# Patient Record
Sex: Female | Born: 1970 | Race: White | Hispanic: Yes | Marital: Single | Smoking: Never smoker
Health system: Southern US, Community
[De-identification: ages and names within clinical notes are randomized; demographics above are authoritative.]

## PROBLEM LIST (undated history)

## (undated) DIAGNOSIS — R112 Nausea with vomiting, unspecified: Secondary | ICD-10-CM

## (undated) DIAGNOSIS — D649 Anemia, unspecified: Secondary | ICD-10-CM

## (undated) DIAGNOSIS — E119 Type 2 diabetes mellitus without complications: Secondary | ICD-10-CM

## (undated) DIAGNOSIS — E785 Hyperlipidemia, unspecified: Secondary | ICD-10-CM

## (undated) DIAGNOSIS — Z9889 Other specified postprocedural states: Secondary | ICD-10-CM

## (undated) DIAGNOSIS — K219 Gastro-esophageal reflux disease without esophagitis: Secondary | ICD-10-CM

## (undated) DIAGNOSIS — I10 Essential (primary) hypertension: Secondary | ICD-10-CM

## (undated) HISTORY — DX: Gastro-esophageal reflux disease without esophagitis: K21.9

## (undated) HISTORY — DX: Hyperlipidemia, unspecified: E78.5

## (undated) HISTORY — PX: LAPAROSCOPIC UNILATERAL SALPINGO OOPHERECTOMY: SHX5935

## (undated) HISTORY — DX: Other specified postprocedural states: Z98.890

## (undated) HISTORY — DX: Type 2 diabetes mellitus without complications: E11.9

## (undated) HISTORY — DX: Anemia, unspecified: D64.9

## (undated) HISTORY — DX: Essential (primary) hypertension: I10

## (undated) HISTORY — DX: Other specified postprocedural states: R11.2

---
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
## 1991-06-02 HISTORY — PX: LAPAROSCOPIC UNILATERAL SALPINGO OOPHERECTOMY: SHX5935

## 2006-07-07 ENCOUNTER — Ambulatory Visit: Payer: Self-pay | Admitting: *Deleted

## 2006-07-07 ENCOUNTER — Encounter (INDEPENDENT_AMBULATORY_CARE_PROVIDER_SITE_OTHER): Payer: Self-pay | Admitting: *Deleted

## 2006-07-08 ENCOUNTER — Ambulatory Visit (HOSPITAL_COMMUNITY): Admission: RE | Admit: 2006-07-08 | Discharge: 2006-07-08 | Payer: Self-pay | Admitting: Family Medicine

## 2006-07-09 ENCOUNTER — Ambulatory Visit: Payer: Self-pay | Admitting: Gynecology

## 2006-07-13 ENCOUNTER — Ambulatory Visit (HOSPITAL_COMMUNITY): Admission: RE | Admit: 2006-07-13 | Discharge: 2006-07-13 | Payer: Self-pay | Admitting: Family Medicine

## 2006-07-22 ENCOUNTER — Ambulatory Visit: Payer: Self-pay | Admitting: Family Medicine

## 2006-07-26 ENCOUNTER — Ambulatory Visit: Payer: Self-pay | Admitting: Family Medicine

## 2006-08-02 ENCOUNTER — Ambulatory Visit: Payer: Self-pay | Admitting: Family Medicine

## 2006-08-03 ENCOUNTER — Ambulatory Visit (HOSPITAL_COMMUNITY): Admission: RE | Admit: 2006-08-03 | Discharge: 2006-08-03 | Payer: Self-pay | Admitting: Family Medicine

## 2006-08-09 ENCOUNTER — Ambulatory Visit: Payer: Self-pay | Admitting: Obstetrics & Gynecology

## 2006-08-23 ENCOUNTER — Ambulatory Visit: Payer: Self-pay | Admitting: Obstetrics & Gynecology

## 2006-08-31 ENCOUNTER — Ambulatory Visit (HOSPITAL_COMMUNITY): Admission: RE | Admit: 2006-08-31 | Discharge: 2006-08-31 | Payer: Self-pay | Admitting: Family Medicine

## 2006-09-06 ENCOUNTER — Ambulatory Visit: Payer: Self-pay | Admitting: Obstetrics & Gynecology

## 2006-09-13 ENCOUNTER — Ambulatory Visit: Payer: Self-pay | Admitting: Obstetrics & Gynecology

## 2006-09-30 ENCOUNTER — Ambulatory Visit (HOSPITAL_COMMUNITY): Admission: RE | Admit: 2006-09-30 | Discharge: 2006-09-30 | Payer: Self-pay | Admitting: Family Medicine

## 2006-10-04 ENCOUNTER — Ambulatory Visit: Payer: Self-pay | Admitting: Obstetrics & Gynecology

## 2006-10-11 ENCOUNTER — Encounter: Admission: RE | Admit: 2006-10-11 | Discharge: 2006-10-11 | Payer: Self-pay | Admitting: Obstetrics & Gynecology

## 2006-10-18 ENCOUNTER — Ambulatory Visit: Payer: Self-pay | Admitting: Family Medicine

## 2006-10-22 ENCOUNTER — Ambulatory Visit (HOSPITAL_COMMUNITY): Admission: RE | Admit: 2006-10-22 | Discharge: 2006-10-22 | Payer: Self-pay | Admitting: Family Medicine

## 2006-10-26 ENCOUNTER — Ambulatory Visit: Payer: Self-pay | Admitting: Obstetrics & Gynecology

## 2006-11-01 ENCOUNTER — Ambulatory Visit: Payer: Self-pay | Admitting: Obstetrics & Gynecology

## 2006-11-04 ENCOUNTER — Ambulatory Visit: Payer: Self-pay | Admitting: Family Medicine

## 2006-11-08 ENCOUNTER — Ambulatory Visit: Payer: Self-pay | Admitting: Family Medicine

## 2006-11-11 ENCOUNTER — Ambulatory Visit: Payer: Self-pay | Admitting: Obstetrics & Gynecology

## 2006-11-15 ENCOUNTER — Ambulatory Visit: Payer: Self-pay | Admitting: Family Medicine

## 2006-11-18 ENCOUNTER — Ambulatory Visit: Payer: Self-pay | Admitting: Gynecology

## 2006-11-19 ENCOUNTER — Encounter: Payer: Self-pay | Admitting: *Deleted

## 2006-11-19 ENCOUNTER — Inpatient Hospital Stay (HOSPITAL_COMMUNITY): Admission: AD | Admit: 2006-11-19 | Discharge: 2006-11-19 | Payer: Self-pay | Admitting: Gynecology

## 2006-11-19 ENCOUNTER — Ambulatory Visit: Payer: Self-pay | Admitting: Physician Assistant

## 2006-11-22 ENCOUNTER — Ambulatory Visit: Payer: Self-pay | Admitting: Family Medicine

## 2006-11-25 ENCOUNTER — Ambulatory Visit (HOSPITAL_COMMUNITY): Admission: RE | Admit: 2006-11-25 | Discharge: 2006-11-25 | Payer: Self-pay | Admitting: Family Medicine

## 2006-11-29 ENCOUNTER — Ambulatory Visit: Payer: Self-pay | Admitting: Obstetrics & Gynecology

## 2006-12-01 ENCOUNTER — Inpatient Hospital Stay (HOSPITAL_COMMUNITY): Admission: RE | Admit: 2006-12-01 | Discharge: 2006-12-04 | Payer: Self-pay | Admitting: Obstetrics & Gynecology

## 2006-12-01 ENCOUNTER — Ambulatory Visit: Payer: Self-pay | Admitting: Obstetrics & Gynecology

## 2006-12-09 ENCOUNTER — Ambulatory Visit: Payer: Self-pay | Admitting: Obstetrics & Gynecology

## 2006-12-23 ENCOUNTER — Ambulatory Visit: Payer: Self-pay | Admitting: Obstetrics & Gynecology

## 2007-09-19 ENCOUNTER — Emergency Department (HOSPITAL_COMMUNITY): Admission: EM | Admit: 2007-09-19 | Discharge: 2007-09-19 | Payer: Self-pay | Admitting: Emergency Medicine

## 2007-12-13 ENCOUNTER — Inpatient Hospital Stay (HOSPITAL_COMMUNITY): Admission: AD | Admit: 2007-12-13 | Discharge: 2007-12-13 | Payer: Self-pay | Admitting: Gynecology

## 2007-12-28 ENCOUNTER — Ambulatory Visit: Payer: Self-pay | Admitting: Obstetrics and Gynecology

## 2009-07-15 ENCOUNTER — Emergency Department (HOSPITAL_COMMUNITY): Admission: EM | Admit: 2009-07-15 | Discharge: 2009-07-15 | Payer: Self-pay | Admitting: Emergency Medicine

## 2010-06-22 ENCOUNTER — Encounter: Payer: Self-pay | Admitting: *Deleted

## 2010-08-04 ENCOUNTER — Encounter (INDEPENDENT_AMBULATORY_CARE_PROVIDER_SITE_OTHER): Payer: Self-pay

## 2010-08-04 ENCOUNTER — Encounter (INDEPENDENT_AMBULATORY_CARE_PROVIDER_SITE_OTHER): Payer: Self-pay | Admitting: Vascular Surgery

## 2010-08-04 DIAGNOSIS — I83009 Varicose veins of unspecified lower extremity with ulcer of unspecified site: Secondary | ICD-10-CM

## 2010-08-04 DIAGNOSIS — I83893 Varicose veins of bilateral lower extremities with other complications: Secondary | ICD-10-CM

## 2010-08-05 ENCOUNTER — Ambulatory Visit (INDEPENDENT_AMBULATORY_CARE_PROVIDER_SITE_OTHER): Payer: Self-pay

## 2010-08-05 DIAGNOSIS — I731 Thromboangiitis obliterans [Buerger's disease]: Secondary | ICD-10-CM

## 2010-08-05 NOTE — Consult Note (Signed)
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  This patient has never had venous evaluation in the past.  She has no history of DVT or thrombophlebitis.  She has had 2 episodes of bleeding in the left ankle laterally, one in February of 2011 and one in January of 2012.  The first one required a trip to the emergency department but no sutures were necessary, the second one stopped spontaneously.  She has no history of stasis ulcers.  She does not wear elastic compression stockings nor elevate her legs nor take pain medicine.  She does have itching discomfort in both legs, left greater than right.  CHRONIC MEDICAL PROBLEMS: 1. Diabetes. 2. Hypertension. 3. History of cesarean surgery. 4. Negative for coronary artery disease, COPD or stroke.  SOCIAL HISTORY:  She is single, has 2 children.  She is employed part- time with [employer].  Does not use tobacco or alcohol.  FAMILY HISTORY:  Negative for coronary artery disease, diabetes or stroke.  REVIEW OF SYSTEMS:  Positive for dizziness, headaches, leg discomfort with ambulation, joint pain, muscle pains.  All other systems in a complete review of systems are negative.  PHYSICAL EXAM:  Vital signs:  Blood pressure 134/86, heart rate 65, respirations 20.  General:  She is a well-developed, well-nourished middle-aged female in no apparent distress, alert and oriented x3. HEENT:  Normal for age.  EOMs intact.  Lungs:  Clear to auscultation. No rhonchi or wheezing.  Cardiovascular:  Reveals a regular rhythm.  No murmurs.  Carotid pulses 3+.  No bruits.  Abdomen:  Soft, nontender with no masses.  Musculoskeletal:  Free of major deformities.  Neurological: Normal.  Skin:  Free of rashes.  Lower extremity exam:  Reveals 3+ femoral, 2+  popliteal and 2+ dorsalis pedis pulses palpable.  She has an area of reticular veins and small varicosities in the lateral ankle area proximal to the lateral malleolus with some small varicosities which extend posteriorly up the popliteal area.  There are no bulging varicosities along the great saphenous system.  There is no distal edema.  No evidence of hyperpigmentation or active ulceration.  I ordered a venous duplex exam which I reviewed and interpreted.  She has no reflux in the great or small saphenous veins.  No DVTs noted. 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  I think the best plan for this lady would be primary sclerotherapy of the area where the bleeding occurred in these varicosities in the posterior calf.  She currently does not have any health insurance and will investigate applying for Medicaid in the near future.  We have talked to her about arranging for sclerotherapy of this lesion in the left lateral ankle which is the most pressing area which did bleed 1 month ago.  She will get in touch with [place] to schedule this.    Quita Skye Hart [place], M.D. Electronically Signed  JDL/MEDQ  D:  08/04/2010  T:  08/05/2010  Job:  [id number]

## 2010-08-13 NOTE — Procedures (Unsigned)
LOWER EXTREMITY VENOUS REFLUX EXAM  INDICATION:  Ulcer.  EXAM:  Using color-flow imaging and pulse Doppler spectral analysis, the left common femoral, superficial femoral, popliteal, posterior tibial, greater and lesser saphenous veins are evaluated.  There is no evidence suggesting deep venous insufficiency in the left lower extremity.  The left saphenofemoral junction is competent. The left GSV is competent.  The left proximal short saphenous vein demonstrates competency.  GSV Diameter (used if found to be incompetent only)                                           Right    Left Proximal Greater Saphenous Vein           cm       cm Proximal-to-mid-thigh                     cm       cm Mid thigh                                 cm       cm Mid-distal thigh                          cm       cm Distal thigh                              cm       cm Knee                                      cm       cm  IMPRESSION: 1. The left greater saphenous vein is competent. 2. The left greater saphenous vein is not tortuous. 3. The deep venous system is competent. 4. The left short saphenous vein is competent. 5. An incompetent perforator along with varicose veins was visualized     within the  cockett regions 1, 2, and 3, measuring 0.24, 0.23, and     0.21 cm respectively.  ___________________________________________ Quita Skye Hart [place], M.D.  OD/MEDQ  D:  08/06/2010  T:  08/06/2010  Job:  [id number]

## 2010-10-14 NOTE — Op Note (Signed)
NAMEJULIAHNA, WISWELL   ACCOUNT NO.:  [id number]   MEDICAL RECORD NO.:  [id number]          PATIENT TYPE:  INP   LOCATION:                                [facility]:  [facility]   PHYSICIAN:  Lesly Dukes, M.D. DATE OF BIRTH:  10/16/1970   DATE OF PROCEDURE:  12/01/2006  DATE OF DISCHARGE:                               OPERATIVE REPORT   PREOPERATIVE DIAGNOSIS:  A 40 year old G2, P1 0-0-0 at 38-6 weeks.  Estimated gestational age for repeat cesarean section.   POSTOPERATIVE DIAGNOSIS:  A 40 year old G2, P1 0-0-0 at 38-6 weeks.  Estimated gestational age for repeat cesarean section plus transverse  lie.   PROCEDURE:  Repeat low transverse cesarean section.   SURGEON:  Lesly Dukes, M.D.   ASSISTANTMichele Mcalpine D. Okey Dupre, M.D.   ANESTHESIA:  Spinal.   PATHOLOGY:  None.   ESTIMATED BLOOD LOSS:  800.   COMPLICATIONS:  None.   FINDINGS:  Viable infant at first transverse back-down presentation that  was rotated to vertex.  Grossly normal uterus, ovaries, and fallopian  tubes.  A moderate amount of adhesion.  See NICU note for details on the  infant.   PROCEDURE:  After informed consent was obtained, the patient was taken  to the operating room, where spinal anesthesia was found to be adequate.  The patient was placed in the dorsal supine position with a leftward  tilt.  The Foley was in the bladder.  A Pfannenstiel skin incision was  made with the scalpel and carried down to the fascia.  The fascia was  incised in the midline and extended bilaterally.  The superior and  inferior aspects of the fascial incision were grasped with kocher  clamps, tented up, and dissected sharply and bluntly from the underlying  layers of rectus muscles.  The rectus muscles were separated in the  midline.  The peritoneum was identified and entered sharply.  This  incision was extended both superiorly and inferiorly with good  visualization of the bladder.  Note, there was a moderate amount  of  adhesions during this process.  The bladder blade was placed into the  peritoneal cavity.  The bladder flap was created.  The bladder blade was  reinserted.  The uterus was incised in a transverse fashion to the lower  uterine segment.  This incision was extended bilaterally with bandage  scissors.  The baby was rotated from transverse back down to vertex, and  the head was delivered atraumatically.  The nose and mouth were  suctioned.  The cord was clamped and cut, and the baby was handed off to  the waiting pediatrician.  Cord blood was sent for type and screen.  Placenta delivered manually.  The uterus was cleared of all clots and  debris.  The uterine incision was closed with 0 Vicryl in a running,  locked fashion.  A second suture of 0 Vicryl was used to reinforce the  incision and aid in hemostasis.  The peritoneal cavity was copiously  irrigated, and the uterus was noted to be hemostatic of tension.  Rectus  muscle and peritoneum were noted to be hemostatic.  The fascia was  closed with 0  Vicryl in a running fashion.  Good hemostasis was noted.  Subcutaneous  tissue was copiously irrigated and found to be hemostatic.  The skin was  closed with staples.  The patient tolerated the procedure well.  Sponge,  lap, instrument, and needle counts were correct x2.  Patient went to the  recovery room in stable condition.      Lesly Dukes, M.D.  Electronically Signed     KHL/MEDQ  D:  12/01/2006  T:  12/01/2006  Job:  [id number]

## 2010-10-14 NOTE — Discharge Summary (Signed)
Jackie, Wells NO.:  [id number]   MEDICAL RECORD NO.:  [id number]          PATIENT TYPE:  INP   LOCATION:  9126                          [facility]ACILITY:  [facility]   PHYSICIAN:  Lesly Dukes, M.D. DATE OF BIRTH:  1970-06-05   DATE OF ADMISSION:  12/01/2006  DATE OF DISCHARGE:  12/04/2006                               DISCHARGE SUMMARY   Attending physician at the time of admission and surgeon at the time of  her repeat low transverse C-section was Dr. Elsie Lincoln.  A repeat low  transverse cesarean section was performed on December 01, 2006, under spinal  anesthesia without complications.  The patient had an estimated blood  loss of 800 mL and patient was noted to have a two-vessel cord at the  delivery of a viable female who had Apgars of 9 and 9.  Female weighed 7  pounds and 7 ounces.  Details of this operation can be found on the  dictated operative note.  The patient was discharged on postoperative  day #3 on December 04, 2006, after an uneventful postpartum course with a  discharge diagnosis of 40 year old gravida 2, para 2, postoperative day  #3 status post a repeat low transverse C-section of a viable female.  She was sent home with prescriptions for Percocet 1-2 tablets q.4-6h.  p.r.n. pain, ibuprofen 600 mg q.6h. p.r.n. cramping, iron sulfate 325 mg  p.o. b.i.d. with meals, Colace 100 mg p.o. b.i.d. with meals.  Patient  was instructed to follow up in the GYN clinic in 1 week for a postop  incision check and to follow up at 6 weeks at [facility] for her 6  week postpartum check and Mirena IUD insert.  The patient was bottle  feeding at the time of her discharge.  The baby was a female infant, was  also discharged with the patient on postoperative day #3.      Maylon Cos, C.N.M.      Lesly Dukes, M.D.  Electronically Signed    SS/MEDQ  D:  01/20/2007  T:  01/21/2007  Job:  [id number]

## 2010-10-14 NOTE — Group Therapy Note (Signed)
Jackie Wells, COCKERELL NO.:  [id number]   MEDICAL RECORD NO.:  [id number]          PATIENT TYPE:  WOC   LOCATION:  [facility]                   FACILITY:  [facility]   PHYSICIAN:  Argentina Donovan, MD        DATE OF BIRTH:  09/05/1970   DATE OF SERVICE:                                  CLINIC NOTE   REASON FOR VISIT:  Follow up to maternity admissions visit on 12/13/2007  for probable missed abortion.   ALLERGIES:  No known drug allergies.   CURRENT MEDICATIONS:  None.   IMMUNIZATIONS:  Up to date, last tetanus unknown.   MENSTRUAL HISTORY:  First day of last menstrual period was 10/05/2006.  The patient's cycles are regular.   GYNECOLOGY HISTORY:  Ovarian cyst.   OBSTETRICAL HISTORY:  Gravida 2, para 1-1-0-1, neonatal death,  approximately 6 months.  Elevated blood pressures in pregnancy.  C-  section x2 .   PAST SURGICAL HISTORY:  C-section x2.  Laparotomy for ovarian cyst.  Tonsils removed.   FAMILY HISTORY:  Hypertension and diabetes.   PAST MEDICAL HISTORY:  1. Pyelonephritis.  2. Anemia.   SOCIAL HISTORY:  The patient denies use of alcohol, drugs or tobacco.   REVIEW OF SYSTEMS:  The patient has no complaints today except mild  abdominal cramping and rumbling which she says is not consistent with  the cramping that she experienced after her miscarriage.  She states  that she passed what looked like a small amniotic sac the day after her  maternity admissions visit and bled a small amount for 2-3 days  afterwards with no temperature and no fever or chills.   OBJECTIVE:  VITAL SIGNS:  Temperature 99.5, pulse 88, blood pressure  133/89, weight 245 pounds, height 63.5 inches.  The patient is a well-  appearing, obese, Hispanic female, appears stated age.  HEENT:  Normocephalic.  HEART:  Normal S1, S2.  No murmurs, rubs or gallops, regular rate and  rhythm.  LUNGS:  Clear to auscultation bilaterally.  ABDOMEN:  Soft, nontender, tympanic to percussion  over right upper and  lower quadrants.  No hepatosplenomegaly.  Bowel sounds x4.  PELVIC:  Deferred since the patient was no longer bleeding.  BREAST:  Deferred.  SKIN: Warm, dry, appropriate color for race.  No rashes or lesions.  NEUROLOGICAL:  Deep tendon reflexes 1+, no clonus.  EXTREMITIES:  No edema.  Negative Homans.   ASSESSMENT:  Completed spontaneous abortions, borderline hypertension,  does not desire pregnancy.   PLAN:  The patient received Depo-Provera 150 mg IM today, quantitative  HCG to verify following __________ center maternity admissions visit.  The patient to follow up in three months for Depo-Provera and 12 months  here.  Patient to follow up in February for Pap.     ______________________________  Jackie Wells, NMW    ______________________________  Argentina Donovan, MD    VS/MEDQ  D:  12/28/2007  T:  12/28/2007  Job:  [id number]

## 2011-02-26 LAB — URINE MICROSCOPIC-ADD ON

## 2011-02-26 LAB — URINALYSIS, ROUTINE W REFLEX MICROSCOPIC
Bilirubin Urine: NEGATIVE
Glucose, UA: NEGATIVE
Leukocytes, UA: NEGATIVE
Nitrite: NEGATIVE
Specific Gravity, Urine: 1.01
Urobilinogen, UA: 0.2

## 2011-02-26 LAB — HEMOGLOBIN AND HEMATOCRIT, BLOOD: HCT: 34.3 — ABNORMAL LOW

## 2011-02-26 LAB — WET PREP, GENITAL
Trich, Wet Prep: NONE SEEN
Yeast Wet Prep HPF POC: NONE SEEN

## 2011-02-26 LAB — GC/CHLAMYDIA PROBE AMP, GENITAL
Chlamydia, DNA Probe: NEGATIVE
GC Probe Amp, Genital: NEGATIVE

## 2011-02-26 LAB — POCT PREGNANCY, URINE: Preg Test, Ur: POSITIVE

## 2011-02-26 LAB — HCG, QUANTITATIVE, PREGNANCY: hCG, Beta Chain, Quant, S: 7527 — ABNORMAL HIGH

## 2011-03-17 LAB — CBC
HCT: 25.7 — ABNORMAL LOW
Hemoglobin: 8.6 — ABNORMAL LOW
MCV: 83.1
WBC: 10

## 2011-03-18 LAB — CBC
HCT: 32.1 — ABNORMAL LOW
HCT: 33.2 — ABNORMAL LOW
Hemoglobin: 10.8 — ABNORMAL LOW
Hemoglobin: 11.1 — ABNORMAL LOW
MCHC: 33.5
MCHC: 33.7
MCV: 82.8
MCV: 82.9
RBC: 3.87
RBC: 4.01

## 2011-03-18 LAB — POCT URINALYSIS DIP (DEVICE)
Bilirubin Urine: NEGATIVE
Bilirubin Urine: NEGATIVE
Bilirubin Urine: NEGATIVE
Glucose, UA: NEGATIVE
Glucose, UA: NEGATIVE
Hgb urine dipstick: NEGATIVE
Hgb urine dipstick: NEGATIVE
Hgb urine dipstick: NEGATIVE
Ketones, ur: NEGATIVE
Nitrite: NEGATIVE
Specific Gravity, Urine: 1.01
Specific Gravity, Urine: 1.01
pH: 6.5

## 2011-03-18 LAB — COMPREHENSIVE METABOLIC PANEL
ALT: 16
BUN: 8
CO2: 24
Calcium: 9.1
Creatinine, Ser: 0.54
GFR calc non Af Amer: >60
Glucose, Bld: 78

## 2011-03-18 LAB — URINE MICROSCOPIC-ADD ON

## 2011-03-18 LAB — URINALYSIS, ROUTINE W REFLEX MICROSCOPIC
Hgb urine dipstick: NEGATIVE
Protein, ur: NEGATIVE
Urobilinogen, UA: 0.2

## 2011-03-18 LAB — BASIC METABOLIC PANEL
CO2: 21
Chloride: 108
GFR calc Af Amer: >60
Potassium: 4
Sodium: 137

## 2011-03-18 LAB — LACTATE DEHYDROGENASE: LDH: 92 — ABNORMAL LOW

## 2011-03-19 LAB — POCT URINALYSIS DIP (DEVICE)
Bilirubin Urine: NEGATIVE
Bilirubin Urine: NEGATIVE
Glucose, UA: NEGATIVE
Glucose, UA: NEGATIVE
Hgb urine dipstick: NEGATIVE
Hgb urine dipstick: NEGATIVE
Nitrite: NEGATIVE
Nitrite: NEGATIVE
Operator id: 159681
Operator id: 234181
Specific Gravity, Urine: 1.01
Specific Gravity, Urine: 1.015

## 2012-03-25 ENCOUNTER — Other Ambulatory Visit (HOSPITAL_COMMUNITY): Payer: Self-pay | Admitting: Specialist

## 2012-03-25 DIAGNOSIS — Z1231 Encounter for screening mammogram for malignant neoplasm of breast: Secondary | ICD-10-CM

## 2012-04-14 ENCOUNTER — Ambulatory Visit (HOSPITAL_COMMUNITY)
Admission: RE | Admit: 2012-04-14 | Discharge: 2012-04-14 | Disposition: A | Discharge status: Home / Self Care | Payer: Self-pay | Source: Ambulatory Visit | Attending: Specialist | Admitting: Specialist

## 2012-04-14 DIAGNOSIS — Z1231 Encounter for screening mammogram for malignant neoplasm of breast: Secondary | ICD-10-CM

## 2012-04-19 ENCOUNTER — Other Ambulatory Visit: Payer: Self-pay | Admitting: Specialist

## 2012-04-19 DIAGNOSIS — R928 Other abnormal and inconclusive findings on diagnostic imaging of breast: Secondary | ICD-10-CM

## 2012-04-29 ENCOUNTER — Ambulatory Visit
Admission: RE | Admit: 2012-04-29 | Discharge: 2012-04-29 | Disposition: A | Discharge status: Home / Self Care | Payer: No Typology Code available for payment source | Source: Ambulatory Visit | Attending: Specialist | Admitting: Specialist

## 2012-04-29 ENCOUNTER — Ambulatory Visit
Admission: RE | Admit: 2012-04-29 | Discharge: 2012-04-29 | Disposition: A | Discharge status: Home / Self Care | Payer: Self-pay | Source: Ambulatory Visit | Attending: Specialist | Admitting: Specialist

## 2012-04-29 DIAGNOSIS — R928 Other abnormal and inconclusive findings on diagnostic imaging of breast: Secondary | ICD-10-CM

## 2014-04-02 ENCOUNTER — Other Ambulatory Visit: Payer: Self-pay | Admitting: Dermatology

## 2014-08-04 ENCOUNTER — Ambulatory Visit (INDEPENDENT_AMBULATORY_CARE_PROVIDER_SITE_OTHER): Payer: 59 | Admitting: Urgent Care

## 2014-08-04 VITALS — BP 128/96 | HR 88 | Temp 97.7°F | Resp 16 | Ht 64.0 in | Wt 238.0 lb

## 2014-08-04 DIAGNOSIS — I10 Essential (primary) hypertension: Secondary | ICD-10-CM | POA: Diagnosis not present

## 2014-08-04 DIAGNOSIS — I152 Hypertension secondary to endocrine disorders: Secondary | ICD-10-CM | POA: Insufficient documentation

## 2014-08-04 DIAGNOSIS — E669 Obesity, unspecified: Secondary | ICD-10-CM | POA: Diagnosis not present

## 2014-08-04 DIAGNOSIS — Z862 Personal history of diseases of the blood and blood-forming organs and certain disorders involving the immune mechanism: Secondary | ICD-10-CM

## 2014-08-04 DIAGNOSIS — E119 Type 2 diabetes mellitus without complications: Secondary | ICD-10-CM | POA: Diagnosis not present

## 2014-08-04 DIAGNOSIS — E1169 Type 2 diabetes mellitus with other specified complication: Secondary | ICD-10-CM | POA: Insufficient documentation

## 2014-08-04 DIAGNOSIS — E785 Hyperlipidemia, unspecified: Secondary | ICD-10-CM | POA: Insufficient documentation

## 2014-08-04 LAB — COMPREHENSIVE METABOLIC PANEL
ALBUMIN: 4.2 g/dL (ref 3.5–5.2)
ALK PHOS: 58 U/L (ref 39–117)
ALT: 52 U/L — AB (ref 0–35)
AST: 40 U/L — ABNORMAL HIGH (ref 0–37)
BUN: 9 mg/dL (ref 6–23)
CHLORIDE: 100 meq/L (ref 96–112)
CO2: 24 mEq/L (ref 19–32)
Calcium: 9.2 mg/dL (ref 8.4–10.5)
Creat: 0.54 mg/dL (ref 0.50–1.10)
GLUCOSE: 169 mg/dL — AB (ref 70–99)
POTASSIUM: 4.3 meq/L (ref 3.5–5.3)
Sodium: 136 mEq/L (ref 135–145)
Total Bilirubin: 0.5 mg/dL (ref 0.2–1.2)
Total Protein: 7.2 g/dL (ref 6.0–8.3)

## 2014-08-04 LAB — LIPID PANEL
Cholesterol: 198 mg/dL (ref 0–200)
HDL: 44 mg/dL — ABNORMAL LOW (ref >=46)
LDL Cholesterol: 126 mg/dL — ABNORMAL HIGH (ref 0–99)
Total CHOL/HDL Ratio: 4.5 Ratio
Triglycerides: 138 mg/dL (ref <150)
VLDL: 28 mg/dL (ref 0–40)

## 2014-08-04 LAB — POCT CBC
Granulocyte percent: 58.4 %G (ref 37–80)
HCT, POC: 41.5 % (ref 37.7–47.9)
Hemoglobin: 13.7 g/dL (ref 12.2–16.2)
Lymph, poc: 3.1 (ref 0.6–3.4)
MCH, POC: 29.7 pg (ref 27–31.2)
MCHC: 33.2 g/dL (ref 31.8–35.4)
MCV: 89.5 fL (ref 80–97)
MID (cbc): 0.6 (ref 0–0.9)
MPV: 7.7 fL (ref 0–99.8)
POC Granulocyte: 5.3 (ref 2–6.9)
POC LYMPH PERCENT: 34.8 %L (ref 10–50)
POC MID %: 6.8 %M (ref 0–12)
Platelet Count, POC: 257 10*3/uL (ref 142–424)
RBC: 4.63 M/uL (ref 4.04–5.48)
RDW, POC: 13.3 %
WBC: 9 10*3/uL (ref 4.6–10.2)

## 2014-08-04 LAB — POCT GLYCOSYLATED HEMOGLOBIN (HGB A1C): Hemoglobin A1C: 9

## 2014-08-04 LAB — TSH: TSH: 3.162 u[IU]/mL (ref 0.350–4.500)

## 2014-08-04 MED ORDER — LISINOPRIL-HYDROCHLOROTHIAZIDE 10-12.5 MG PO TABS: 1.0000 | ORAL_TABLET | Freq: Every day | ORAL | Status: DC

## 2014-08-04 MED ORDER — METFORMIN HCL 1000 MG PO TABS: 1000.0000 mg | ORAL_TABLET | Freq: Two times a day (BID) | ORAL | Status: DC

## 2014-08-04 NOTE — Progress Notes (Signed)
MRN: [id number] DOB: 03/17/1971  Subjective:   Jackie Wells is a 44 y.o. female presenting for chief complaint of Medication Refill  Diabetes - diagnosed in 2013, managed with Metformin 500mg  BID, does not check blood sugar at home. Diet is really unhealthy, does not exercise. Feels some numbness and tingling in hands and feet bilaterally, intermittently, once or twice a month. Wears glasses, has not had a check up in 2 years. Denies polydipsia, polyphagia, polyuria.  HTN - managed with lisinopril 5mg . Does not avoid salt in diet. Exercise as above. Denies chest pain, heart racing, palpitations, lightheadedness, dizziness, double vision, changes in vision, headaches.  Anemia - reports history of anemia found incidentally. Currently takes oral iron supplement once daily.   No smoking, no alcohol use. Denies any other aggravating or relieving factors, no other questions or concerns.  Frederick has a current medication list which includes the following prescription(s): lisinopril and metformin. She has No Known Allergies.  Filicia  has a past medical history of Anemia and Diabetes mellitus without complication. Also  has no past surgical history on file.  ROS As in subjective.  Objective:   Vitals: BP 128/96 mmHg  Pulse 88  Temp(Src) 97.7 F (36.5 C) (Oral)  Resp 16  Ht 5\' 4"  (1.626 m)  Wt 238 lb (107.956 kg)  BMI 40.83 kg/m2  SpO2 97%  LMP 07/21/2014  Physical Exam  Constitutional: She is oriented to person, place, and time and well-developed, well-nourished, and in no distress.  HENT:  TM's intact bilaterally, no effusions or erythema. Nares patent, nasal turbinates pink and moist. Oropharynx clear, mucous membranes moist, dentition in good repair.  Eyes: Conjunctivae are normal. Right eye exhibits no discharge. Left eye exhibits no discharge. No scleral icterus.  Neck: Normal range of motion. No thyromegaly present.  Cardiovascular: Normal rate, regular  rhythm and intact distal pulses.  Exam reveals no gallop and no friction rub.   No murmur heard. Pulmonary/Chest: No respiratory distress. She has no wheezes. She has no rales. She exhibits no tenderness.  Abdominal: Soft. Bowel sounds are normal. She exhibits no distension and no mass. There is no tenderness.  Lymphadenopathy:    She has no cervical adenopathy.  Neurological: She is alert and oriented to person, place, and time.  Skin: Skin is warm and dry. No rash noted. No erythema. No pallor.   Results for orders placed or performed in visit on 08/04/14 (from the past 24 hour(s))  POCT CBC     Status: None   Collection Time: 08/04/14 12:28 PM  Result Value Ref Range   WBC 9.0 4.6 - 10.2 K/uL   Lymph, poc 3.1 0.6 - 3.4   POC LYMPH PERCENT 34.8 10 - 50 %L   MID (cbc) 0.6 0 - 0.9   POC MID % 6.8 0 - 12 %M   POC Granulocyte 5.3 2 - 6.9   Granulocyte percent 58.4 37 - 80 %G   RBC 4.63 4.04 - 5.48 M/uL   Hemoglobin 13.7 12.2 - 16.2 g/dL   HCT, POC 41.5 37.7 - 47.9 %   MCV 89.5 80 - 97 fL   MCH, POC 29.7 27 - 31.2 pg   MCHC 33.2 31.8 - 35.4 g/dL   RDW, POC 13.3 %   Platelet Count, POC 257 142 - 424 K/uL   MPV 7.7 0 - 99.8 fL  POCT glycosylated hemoglobin (Hb A1C)     Status: None   Collection Time: 08/04/14 12:33 PM  Result  Value Ref Range   Hemoglobin A1C 9.0    Assessment and Plan :   1. Diabetes mellitus type 2 in obese 2. Morbid obesity - Uncontrolled due to dietary and exercise noncompliance. Increase Metformin to 1,000mg  BID - Referral to diabetic counseling, start exercise for weight loss - Labs pending f/u in 1 month  3. Essential hypertension - Uncontrolled, increase to lis 10mg  add in combo with HCT 12.5mg  - Check BP weekly - Minimize salt intake, nutritional counseling as above - Follow up in 1 month  4. History of anemia - Stable, continue oral iron supplement  Jaynee Eagles, PA-C [facility] [phone] 08/04/2014 11:52 AM

## 2014-08-04 NOTE — Patient Instructions (Addendum)
Diabetes y Kandace Blitz fsica (Diabetes and Exercise) Hacer actividad fsica con regularidad es muy importante. No se trata solo de [employer]. Tiene muchos otros beneficios, como por ejemplo:  Mejorar el estado fsico, la flexibilidad y la resistencia.  Aumenta la densidad sea.  Ayuda a Technical sales engineer.  Disminuye la Air traffic controller.  Aumenta la fuerza muscular.  Reduce el estrs y las tensiones.  Mejora el estado de salud general. Las personas diabticas que realizan actividad fsica tienen beneficios adicionales debido al ejercicio:  Reduce el apetito.  El organismo mejora el uso del azcar (glucosa) de la [place].  Ayuda a disminuir o Product/process development scientist.  Disminuye la presin arterial.  Ayuda a disminuir los lpidos en la sangre (colesterol y triglicridos).  El organismo mejora el uso de la insulina porque:  Aumenta la sensibilidad del organismo a la insulina.  Reduce las necesidades de insulina del organismo.  Disminuye el riesgo de enfermedad cardaca por la actividad fsica ya que  disminuye el colesterol y [employer] triglicridos.  Aumenta los niveles de colesterol bueno (como las lipoprotenas de alta densidad [HDL]) en el organismo.  Disminuye los niveles de glucosa en la [place]. SU PLAN DE ACTIVIDAD  Elija una actividad que disfrute y establezca objetivos realistas. Su mdico o educador en diabetes podrn ayudarlo a encontrar una actividad que lo beneficie. Haga ejercicio regularmente como se lo haya indicado el mdico. Esto incluye:  Hacer entrenamiento de [employer] a la semana, como flexiones, sentadillas, levantar peso o usar bandas de resistencia.  Practicar 172minutos de ejercicios cardiovasculares cada semana, como caminar, correr o hacer algn deporte.  Mantenerse activo y no permanecer inactivo durante ms de 57minutos seguidos. Los perodos cortos de [place] tambin son beneficiosos. Tres sesiones de 59minutos a  lo largo del da son tan beneficiosas como una sola sesin de 21minutos. Estas son algunas ideas para los ejercicios:  Lleve a Probation officer.  Utilice las Clinical cytogeneticist del ascensor.  Baile su cancin favorita.  Haga los ejercicios de un video de ejercicios.  Haga sus ejercicios favoritos con Gaffer. RECOMENDACIONES PARA REALIZAR EJERCICIOS CUANDO SE TIENE DIABETES TIPO 1 O TIPO 2   Controle la glucosa en la sangre antes de comenzar. Si el nivel de glucosa en la sangre es de ms de 240 mg/dl, controle las cetonas en la [place]. No haga actividad fsica si hay cetonas.  Evite inyectarse insulina en las zonas del cuerpo que ejercitar. Por ejemplo, evite inyectarse insulina en:  Los brazos, si juega al tenis.  Las piernas, si corre.  Lleve un registro de:  Los alimentos que consume antes y despus de TEFL teacher.  Los momentos esperables de picos de accin de la insulina.  Los niveles de glucosa en la sangre antes y despus de hacer ejercicios.  El tipo y cantidad de [place] fsica que Musician.  Revise los registros con su mdico. El mdico lo ayudar a Actor pautas para ajustar la cantidad de alimento y las cantidades de insulina antes y despus de Field seismologist ejercicios.  Si toma insulina o agentes hipoglucemiantes por va oral, observe si hay signos y sntomas de hipoglucemia. Entre los que se incluyen:  Mareos.  Temblores.  Sudoracin.  Escalofros.  Confusin.  Beba gran cantidad de agua mientras hace ejercicios para evitar la deshidratacin o los golpes de Freight forwarder. Durante la actividad fsica se pierde agua corporal que se debe reponer.  Comente con su mdico antes de comenzar un programa de  actividad fsica para verificar que sea seguro para usted. Recuerde, cualquier actividad es mejor que ninguna. Document Released: 06/07/2007 Document Revised: 10/02/2013 [facility] Patient Information 2015 [place], [place]. This information is not intended to  replace advice given to you by your health care provider. Make sure you discuss any questions you have with your health care provider.   Plan de alimentacin DASH (DASH Eating Plan) DASH es la sigla en ingls de "Enfoques Alimentarios para Detener la Hipertensin". El plan de alimentacin DASH ha demostrado bajar la presin arterial elevada (hipertensin). Los beneficios adicionales para la salud pueden incluir la disminucin del riesgo de diabetes mellitus tipo2, enfermedades cardacas e ictus. Este plan tambin puede ayudar a Horticulturist, commercial. QU DEBO SABER ACERCA DEL PLAN DE ALIMENTACIN DASH? Para el plan de alimentacin DASH, seguir las siguientes pautas generales:  Elija los alimentos con un valor porcentual diario de sodio de menos del 5% (segn figura en la etiqueta del alimento).  Use hierbas o aderezos sin sal, en lugar de sal de mesa o sal marina.  Consulte al mdico o farmacutico antes de usar sustitutos de la sal.  Coma productos con bajo contenido de sodio, cuya etiqueta suele decir "bajo contenido de sodio" o "sin agregado de sal".  Coma alimentos frescos.  Coma ms verduras, frutas y productos lcteos con bajo contenido de [place].  Elija los cereales integrales. Busque la palabra "integral" en Equities trader de la lista de ingredientes.  Elija el pescado y el pollo o el pavo sin piel ms a menudo que las carnes rojas. Limite el consumo de pescado, carne de ave y carne a 6onzas (170g) por Training and development officer.  Limite el consumo de dulces, postres, azcares y bebidas azucaradas.  Elija las grasas saludables para el corazn.  Limite el consumo de queso a 1onza (28g) por Training and development officer.  Consuma ms comida casera y menos de restaurante, de buf y comida rpida.  Limite el consumo de alimentos fritos.  Cocine los alimentos utilizando mtodos que no sean la fritura.  Limite las verduras enlatadas. Si las consume, enjuguelas bien para disminuir el sodio.  Cuando coma en un restaurante, pida  que preparen su comida con menos sal o, en lo posible, sin nada de sal. QU ALIMENTOS PUEDO COMER? Pida ayuda a un nutricionista para conocer las necesidades calricas individuales. Cereales Pan de salvado o integral. Arroz integral. Pastas de salvado o integrales. Quinua, trigo burgol y cereales integrales. Cereales con bajo contenido de sodio. Tortillas de harina de maz o de salvado. Pan de maz integral. Galletas saladas integrales. Galletas con bajo contenido de [place]. Vegetales Verduras frescas o congeladas (crudas, al vapor, asadas o grilladas). Jugos de tomate y verduras con contenido bajo o reducido de sodio. Pasta y salsa de tomate con contenido bajo o [place]. Verduras enlatadas con bajo contenido de sodio o reducido de sodio.  Lambert Mody Lambert Mody frescas, en conserva (en su jugo natural) o frutas congeladas. Carnes y otros productos con protenas Carne de res molida (al 85% o ms [place]), carne de res de animales alimentados con pastos o carne de res sin la grasa. Pollo o pavo sin piel. Carne de pollo o de [place]. Cerdo sin la grasa. Todos los pescados y frutos de mar. Huevos. Porotos, guisantes o lentejas secos. Frutos secos y semillas sin sal. Frijoles enlatados sin sal. Lcteos Productos lcteos con bajo contenido de grasas, como [place] o al 1%, quesos reducidos en grasas o al 2%, ricota con bajo contenido de grasas o [employer], o  yogur natural con bajo contenido de grasas. Quesos con contenido bajo o reducido de sodio. Grasas y Naval architect en barra que no contengan grasas trans. [place] y alios para ensaladas livianos o reducidos en grasas (reducidos en sodio). [place]. Aceites de crtamo, oliva o canola. [place] natural de man o almendra. Otros Palomitas de maz y pretzels sin sal. Los artculos mencionados arriba pueden no ser [employer] Foods Company de las bebidas o los alimentos recomendados. Comunquese con el nutricionista para conocer ms  opciones. QU ALIMENTOS NO SE RECOMIENDAN? Cereales Pan blanco. Pastas blancas. Arroz blanco. Pan de maz refinado. Bagels y croissants. Galletas saladas que contengan grasas trans. Vegetales Vegetales con crema o fritos. Verduras en [place]. Verduras enlatadas comunes. Pasta y salsa de tomate en lata comunes. Jugos comunes de tomate y de verduras. Lambert Mody Frutas secas. Fruta enlatada en almbar liviano o espeso. Jugo de frutas. Carnes y otros productos con protenas [place] de carne con Lobbyist. Costillas, alas de pollo, tocineta, salchicha, mortadela, salame, chinchulines, tocino, perros calientes, salchichas alemanas y embutidos envasados. Frutos secos y semillas con sal. Frijoles con sal en lata. Lcteos [place]eche entera o al 2%, crema, mezcla de [place] y crema, y queso crema. Yogur entero o endulzado. Quesos o queso azul con alto contenido de Physicist, medical. [place]remas no lcteas y coberturas batidas. Quesos procesados, quesos para untar o cuajadas. Condimentos Sal de cebolla y ajo, sal condimentada, sal de mesa y sal marina. Salsas en lata y envasadas. Salsa Worcestershire. Salsa trtara. Salsa barbacoa. Salsa teriyaki. Salsa de soja, incluso la que tiene contenido reducido de [place]. Salsa de carne. Salsa de pescado. Salsa de [place]. Salsa rosada. Rbano picante. Ketchup y mostaza. Saborizantes y tiernizantes para carne. Caldo en cubitos. Salsa picante. Salsa tabasco. Adobos. Aderezos para tacos. Salsas. Grasas y aceites [place], [place] en barra, Saks de [place], [place], [place] clarificada y Wendee Copp de tocino. Aceites de coco, de palmiste o de palma. Aderezos comunes para ensalada. Otros Pickles y [place]. Palomitas de maz y pretzels con sal. Los artculos mencionados arriba pueden no ser [employer] Foods Company de las bebidas y los alimentos que se Higher education careers adviser. Comunquese con el nutricionista para obtener ms informacin. DNDE Dolan Amen MS INFORMACIN? [place], del Pulmn y de la Sangre (National Heart, Lung, and [place]): travelstabloid.com Document Released: 05/07/2011 Document Revised: 10/02/2013 [facility] Patient Information 2015 [place], [place]. This information is not intended to replace advice given to you by your health care provider. Make sure you discuss any questions you have with your health care provider.

## 2014-08-06 ENCOUNTER — Telehealth: Payer: Self-pay

## 2014-08-06 NOTE — Telephone Encounter (Signed)
Pt came in with insurance information, and states that she paid in full the last time she was here. Pt would like to know if this will be refilled and reimbursed. Please advise

## 2014-08-07 ENCOUNTER — Telehealth: Payer: Self-pay | Admitting: Urgent Care

## 2014-08-07 NOTE — Telephone Encounter (Signed)
Patient returning a call to [facility] PA, please call back at [phone]

## 2014-08-07 NOTE — Telephone Encounter (Signed)
Called patient to report results, left message requesting a call back.  Jaynee Eagles, PA-C [facility] [phone] 08/07/2014  2:08 PM

## 2014-08-07 NOTE — Telephone Encounter (Deleted)
Called patient to report results, left message requesting a call back.  Jaynee Eagles, PA-C [facility] [phone] 08/07/2014  2:08 PM

## 2014-08-09 NOTE — Telephone Encounter (Signed)
Called patient, advised to continue treatment plan for diabetes including dietary and exercise modifications which will help with elevated cholesterol. Repeat CMet in 3 months at follow up.  Jaynee Eagles, PA-C [facility] [phone] 08/09/2014  6:20 PM

## 2014-08-10 NOTE — Telephone Encounter (Signed)
Informed patient, claim was filed with insurance company. We will refund( if applicable) after insurance responds.

## 2014-08-29 ENCOUNTER — Ambulatory Visit: Payer: No Typology Code available for payment source | Admitting: *Deleted

## 2014-09-27 ENCOUNTER — Encounter (HOSPITAL_COMMUNITY): Payer: Self-pay | Admitting: Emergency Medicine

## 2014-09-27 ENCOUNTER — Emergency Department (HOSPITAL_COMMUNITY)
Admission: EM | Admit: 2014-09-27 | Discharge: 2014-09-27 | Disposition: A | Discharge status: Home / Self Care | Payer: No Typology Code available for payment source | Source: Home / Self Care | Attending: Emergency Medicine | Admitting: Emergency Medicine

## 2014-09-27 DIAGNOSIS — L03011 Cellulitis of right finger: Secondary | ICD-10-CM

## 2014-09-27 MED ORDER — IBUPROFEN 600 MG PO TABS: 600.0000 mg | ORAL_TABLET | Freq: Four times a day (QID) | ORAL | Status: DC | PRN

## 2014-09-27 MED ORDER — CEPHALEXIN 500 MG PO CAPS: 500.0000 mg | ORAL_CAPSULE | Freq: Four times a day (QID) | ORAL | Status: DC

## 2014-09-27 NOTE — ED Provider Notes (Signed)
CSN: [id number]     Arrival date & time 09/27/14  1803 History   First MD Initiated Contact with Patient 09/27/14 1924     Chief Complaint  Patient presents with  . Hand Injury   (Consider location/radiation/quality/duration/timing/severity/associated sxs/prior Treatment) HPI  She is a 44 year old woman here for evaluation of right thumb injury. A phone interpreter was used during this encounter. 6 days ago she was shredding papers at work when her thumb got pinched in the machine. She didn't think anything of it over the weekend. On Tuesday, she noted some swelling around the radial side of the nail. It is painful. She states there is pus there. She has tried a warm soak and a peroxide soak without improvement.  No fever. No difficulty moving the thumb.  Past Medical History  Diagnosis Date  . Anemia   . Diabetes mellitus without complication    History reviewed. No pertinent past surgical history. No family history on file. History  Substance Use Topics  . Smoking status: Never Smoker   . Smokeless tobacco: Not on file  . Alcohol Use: Not on file   OB History    No data available     Review of Systems As in history of present illness Allergies  Review of patient's allergies indicates no known allergies.  Home Medications   Prior to Admission medications   Medication Sig Start Date End Date Taking? Authorizing Provider  cephALEXin (KEFLEX) 500 MG capsule Take 1 capsule (500 mg total) by mouth 4 (four) times daily. 09/27/14   Melony Overly, MD  ibuprofen (ADVIL,MOTRIN) 600 MG tablet Take 1 tablet (600 mg total) by mouth every 6 (six) hours as needed for moderate pain. 09/27/14   Melony Overly, MD  lisinopril (PRINIVIL,ZESTRIL) 5 MG tablet Take 5 mg by mouth daily.    Historical Provider, MD  lisinopril-hydrochlorothiazide (PRINZIDE,ZESTORETIC) 10-12.5 MG per tablet Take 1 tablet by mouth daily. 08/04/14   Jaynee Eagles, PA-C  metFORMIN (GLUCOPHAGE) 1000 MG tablet Take 1 tablet (1,000  mg total) by mouth 2 (two) times daily with a meal. 08/04/14   Jaynee Eagles, PA-C   BP 148/95 mmHg  Pulse 88  Temp(Src) 98.1 F (36.7 C) (Oral)  Resp 20  SpO2 99% Physical Exam  Constitutional: She is oriented to person, place, and time. She appears well-developed and well-nourished. No distress.  Neck: Neck supple.  Cardiovascular: Normal rate.   Pulmonary/Chest: Effort normal.  Musculoskeletal:  Right thumb: Swelling and erythema of radial nail bed. She has brisk cap refill. Full range of motion of the thumb.  Neurological: She is alert and oriented to person, place, and time.    ED Course  INCISION AND DRAINAGE Date/Time: 09/27/2014 8:11 PM Performed by: Melony Overly Authorized by: Melony Overly Consent: Verbal consent obtained. Risks and benefits: risks, benefits and alternatives were discussed Consent given by: patient Patient understanding: patient states understanding of the procedure being performed Patient identity confirmed: verbally with patient Time out: Immediately prior to procedure a "time out" was called to verify the correct patient, procedure, equipment, support staff and site/side marked as required. Type: abscess Body area: upper extremity Location details: right thumb Anesthesia method: cold spray. Scalpel size: 11 Incision type: single straight Complexity: simple Drainage: purulent Drainage amount: moderate Patient tolerance: Patient tolerated the procedure well with no immediate complications   (including critical care time) Labs Review Labs Reviewed - No data to display  Imaging Review No results found.    MDM  1. Paronychia, right    Drained today. We'll treat with a seven-day course of Keflex. Recommended soaks in warm soapy water 3 times a day. Follow-up as needed.    Melony Overly, MD 09/27/14 2012

## 2014-09-27 NOTE — ED Notes (Signed)
Describes jamming right thumb into a piece of equipment, not mashed, but jammed.  Right thumb is painful.  Currently, patient is removing fingernail polish

## 2014-09-27 NOTE — Discharge Instructions (Signed)
You have an infection around your nail. We drained the pus today. Soak your thumb in warm soapy water 3 times a day. Take Keflex 4 times a day for the next week. Follow-up if no improvement after one week.

## 2014-11-08 ENCOUNTER — Ambulatory Visit (INDEPENDENT_AMBULATORY_CARE_PROVIDER_SITE_OTHER): Payer: 59 | Admitting: Urgent Care

## 2014-11-08 VITALS — BP 130/84 | HR 81 | Temp 98.7°F | Resp 16 | Ht 64.0 in | Wt 238.2 lb

## 2014-11-08 DIAGNOSIS — D649 Anemia, unspecified: Secondary | ICD-10-CM | POA: Diagnosis not present

## 2014-11-08 DIAGNOSIS — E1165 Type 2 diabetes mellitus with hyperglycemia: Secondary | ICD-10-CM

## 2014-11-08 DIAGNOSIS — E1169 Type 2 diabetes mellitus with other specified complication: Secondary | ICD-10-CM

## 2014-11-08 DIAGNOSIS — E669 Obesity, unspecified: Secondary | ICD-10-CM | POA: Diagnosis not present

## 2014-11-08 DIAGNOSIS — R7989 Other specified abnormal findings of blood chemistry: Secondary | ICD-10-CM | POA: Diagnosis not present

## 2014-11-08 DIAGNOSIS — I10 Essential (primary) hypertension: Secondary | ICD-10-CM | POA: Diagnosis not present

## 2014-11-08 DIAGNOSIS — E119 Type 2 diabetes mellitus without complications: Secondary | ICD-10-CM

## 2014-11-08 DIAGNOSIS — IMO0002 Reserved for concepts with insufficient information to code with codable children: Secondary | ICD-10-CM

## 2014-11-08 DIAGNOSIS — R945 Abnormal results of liver function studies: Secondary | ICD-10-CM

## 2014-11-08 LAB — POCT CBC
GRANULOCYTE PERCENT: 59.4 % (ref 37–80)
HCT, POC: 37.2 % — AB (ref 37.7–47.9)
Hemoglobin: 12.5 g/dL (ref 12.2–16.2)
LYMPH, POC: 3.7 — AB (ref 0.6–3.4)
MCH, POC: 30 pg (ref 27–31.2)
MCHC: 33.6 g/dL (ref 31.8–35.4)
MCV: 89.2 fL (ref 80–97)
MID (cbc): 0.4 (ref 0–0.9)
MPV: 8.5 fL (ref 0–99.8)
PLATELET COUNT, POC: 261 10*3/uL (ref 142–424)
POC Granulocyte: 6 (ref 2–6.9)
POC LYMPH PERCENT: 36.4 %L (ref 10–50)
POC MID %: 4.2 %M (ref 0–12)
RBC: 4.17 M/uL (ref 4.04–5.48)
RDW, POC: 13.2 %
WBC: 10.1 10*3/uL (ref 4.6–10.2)

## 2014-11-08 LAB — POCT GLYCOSYLATED HEMOGLOBIN (HGB A1C): Hemoglobin A1C: 8.4

## 2014-11-08 MED ORDER — LISINOPRIL-HYDROCHLOROTHIAZIDE 10-12.5 MG PO TABS: 1.0000 | ORAL_TABLET | Freq: Every day | ORAL | Status: DC

## 2014-11-08 MED ORDER — METFORMIN HCL 1000 MG PO TABS: 1000.0000 mg | ORAL_TABLET | Freq: Two times a day (BID) | ORAL | Status: DC

## 2014-11-08 MED ORDER — IRON 325 (65 FE) MG PO TABS: 1.0000 | ORAL_TABLET | Freq: Every day | ORAL | Status: DC

## 2014-11-08 NOTE — Progress Notes (Signed)
MRN: [id number] DOB: 11/19/1970  Subjective:   Jackie Wells is a 44 y.o. female presenting for chief complaint of Medication Refill and Rash  Diabetes - managed with Metformin, does not check blood sugar at home. Diet is non-compliant, does not exercise. Is active at work, works in a factory. Denies blurred vision, polydipsia, chest pain, shob, n/v, abdomina pain, polyuria, nocturia, rashes, numbness and tingling, foot ulcers or wounds that won't heal. Denies smoking or alcohol use.  HTN - managed with lis-HCT. Denies lightheadedness, dizziness, heart racing, palpitations, diaphoresis, limb pain, neck pain, jaw pain. ROS as above.  Anemia - reports history of this, was taking iron supplement but ran out. She would like to have cbc rechecked and med refill. Denies fatigue, pallor, pica, doe.  Denies any other aggravating or relieving factors, no other questions or concerns.  Jackie Wells has a current medication list which includes the following prescription(s): ibuprofen, lisinopril-hydrochlorothiazide, and metformin. She has No Known Allergies.  Jackie Wells  has a past medical history of Anemia and Diabetes mellitus without complication. Also  has no past surgical history on file.  ROS As in subjective.  Objective:   Vitals: BP 130/84 mmHg  Pulse 81  Temp(Src) 98.7 F (37.1 C) (Oral)  Resp 16  Ht 5\' 4"  (1.626 m)  Wt 238 lb 4 oz (108.069 kg)  BMI 40.88 kg/m2  SpO2 96%  LMP 09/30/2014  Physical Exam  Constitutional: She is oriented to person, place, and time. She appears well-developed and well-nourished.  HENT:  Mouth/Throat: Oropharynx is clear and moist.  Eyes: Conjunctivae are normal. Pupils are equal, round, and reactive to light. No scleral icterus.  Neck: No thyromegaly present.  Cardiovascular: Normal rate, regular rhythm and intact distal pulses.   Pulmonary/Chest: No respiratory distress. She has no wheezes. She has no rales.  Abdominal: Soft. Bowel  sounds are normal. She exhibits no distension and no mass. There is no tenderness.  Neurological: She is alert and oriented to person, place, and time.  Skin: Skin is warm and dry. No rash noted. No erythema. No pallor.   Results for orders placed or performed in visit on 11/08/14 (from the past 24 hour(s))  POCT CBC     Status: Abnormal   Collection Time: 11/08/14  7:45 PM  Result Value Ref Range   WBC 10.1 4.6 - 10.2 K/uL   Lymph, poc 3.7 (A) 0.6 - 3.4   POC LYMPH PERCENT 36.4 10 - 50 %L   MID (cbc) 0.4 0 - 0.9   POC MID % 4.2 0 - 12 %M   POC Granulocyte 6.0 2 - 6.9   Granulocyte percent 59.4 37 - 80 %G   RBC 4.17 4.04 - 5.48 M/uL   Hemoglobin 12.5 12.2 - 16.2 g/dL   HCT, POC 37.2 (A) 37.7 - 47.9 %   MCV 89.2 80 - 97 fL   MCH, POC 30.0 27 - 31.2 pg   MCHC 33.6 31.8 - 35.4 g/dL   RDW, POC 13.2 %   Platelet Count, POC 261 142 - 424 K/uL   MPV 8.5 0 - 99.8 fL  POCT glycosylated hemoglobin (Hb A1C)     Status: None   Collection Time: 11/08/14  7:45 PM  Result Value Ref Range   Hemoglobin A1C 8.4    Assessment and Plan :   1. Diabetes type 2, uncontrolled 2. Diabetes mellitus type 2 in obese - Uncontrolled but improved - Patient refused additional medication today - I negotiated with patient  that if she makes significant dietary changes and A1c continues to improve then we can hold off on adding another medication, dietary counseling - Follow up in 3 months  3. Elevated LFTs - Recheck today, unclear etiology, will continue to monitor  4. Essential hypertension - Labs pending - Continue current medications - Recheck in 6 months  5. Morbid obesity - Dietary and exercise modifications recommended  6. Anemia, unspecified anemia type - Labs pending - Stable, iron supplement refilled, recommended docusate if she has constipation  Jaynee Eagles, PA-C [facility] [phone] 11/08/2014 7:24 PM

## 2014-11-08 NOTE — Patient Instructions (Signed)
La diabetes mellitus y los alimentos (Diabetes Mellitus and Food) Es importante que controle su nivel de azcar en la sangre (glucosa). El nivel de glucosa en sangre depende en gran medida de lo que usted come. Comer alimentos saludables en las cantidades [place] a lo largo del Training and development officer, aproximadamente a la misma hora [employer], lo ayudar a Chief Technology Officer su nivel de Multimedia programmer. Tambin puede ayudarlo a retrasar o Patent attorney de la diabetes mellitus. Comer de [employer] saludable incluso puede ayudarlo a Chartered loss adjuster de presin arterial y a Science writer o Theatre manager un peso saludable.  CMO PUEDEN AFECTARME LOS ALIMENTOS? Carbohidratos Los carbohidratos afectan el nivel de glucosa en sangre ms que cualquier otro tipo de alimento. El nutricionista lo ayudar a Teacher, adult education cuntos carbohidratos puede consumir en cada comida y ensearle a contarlos. El recuento de carbohidratos es importante para mantener la glucosa en sangre en un nivel saludable, en especial si utiliza insulina o toma determinados medicamentos para la diabetes mellitus. Alcohol El alcohol puede provocar disminuciones sbitas de la glucosa en sangre (hipoglucemia), en especial si utiliza insulina o toma determinados medicamentos para la diabetes mellitus. La hipoglucemia es una afeccin que puede poner en peligro la vida. Los sntomas de la hipoglucemia (somnolencia, mareos y Data processing manager) son similares a los sntomas de haber consumido mucho alcohol.  Si el mdico lo autoriza a beber alcohol, hgalo con moderacin y siga estas pautas:  Las mujeres no deben beber ms de un trago por da, y los hombres no deben beber ms de dos tragos por Training and development officer. Un trago es igual a:  12 onzas (355 ml) de cerveza  5 onzas de vino (150 ml) de vino  1,5onzas (35ml) de bebidas espirituosas  No beba con el estmago vaco.  Mantngase hidratado. Beba agua, gaseosas dietticas o t helado sin azcar.  Las gaseosas comunes, los jugos y  otros refrescos podran contener muchos carbohidratos y se Civil Service fast streamer. QU ALIMENTOS NO SE RECOMIENDAN? Cuando haga las elecciones de alimentos, es importante que recuerde que todos los alimentos son distintos. Algunos tienen menos nutrientes que otros por porcin, aunque podran tener la misma cantidad de caloras o carbohidratos. Es difcil darle al cuerpo lo que necesita cuando consume alimentos con menos nutrientes. Estos son algunos ejemplos de alimentos que debera evitar ya que contienen muchas caloras y carbohidratos, pero pocos nutrientes:  Physicist, medical trans (la mayora de los alimentos procesados incluyen grasas trans en la etiqueta de Informacin nutricional).  Gaseosas comunes.  Jugos.  Caramelos.  Dulces, como tortas, pasteles, rosquillas y [place].  Comidas fritas. QU ALIMENTOS PUEDO COMER? Consuma alimentos ricos en nutrientes, que nutrirn el cuerpo y lo mantendrn saludable. Los alimentos que debe comer tambin dependern de varios factores, como:  Las caloras que necesita.  Los medicamentos que toma.  Su peso.  El nivel de glucosa en [place].  El [place] de presin arterial.  El nivel de colesterol. Tambin debe consumir una variedad de [place], como:  Protenas, como carne, aves, pescado, tofu, frutos secos y semillas (las protenas de [place] magros son mejores).  Lambert Mody.  Verduras.  Productos lcteos, como [place], queso y yogur (descremados son mejores).  Panes, granos, pastas, cereales, arroz y frijoles.  Grasas, como aceite de [place], [place] sin grasas trans, aceite de canola, aguacate y [place]. TODOS LOS QUE PADECEN DIABETES MELLITUS TIENEN EL [place] PLAN DE [place]? Dado que todas las personas que padecen diabetes mellitus son distintas, no hay un solo plan de comidas que funcione para todos. Es muy  importante que se rena con un nutricionista que lo ayudar a crear un plan de comidas adecuado para usted. Document Released: 08/25/2007  Document Revised: 05/23/2013 [facility] Patient Information 2015 [place]. This information is not intended to replace advice given to you by your health care provider. Make sure you discuss any questions you have with your health care provider.

## 2014-11-09 DIAGNOSIS — E1165 Type 2 diabetes mellitus with hyperglycemia: Secondary | ICD-10-CM | POA: Insufficient documentation

## 2014-11-09 DIAGNOSIS — IMO0002 Reserved for concepts with insufficient information to code with codable children: Secondary | ICD-10-CM | POA: Insufficient documentation

## 2014-11-09 LAB — COMPREHENSIVE METABOLIC PANEL
ALK PHOS: 59 U/L (ref 39–117)
ALT: 57 U/L — ABNORMAL HIGH (ref 0–35)
AST: 46 U/L — AB (ref 0–37)
Albumin: 4.4 g/dL (ref 3.5–5.2)
BILIRUBIN TOTAL: 0.4 mg/dL (ref 0.2–1.2)
BUN: 8 mg/dL (ref 6–23)
CO2: 22 mEq/L (ref 19–32)
CREATININE: 0.63 mg/dL (ref 0.50–1.10)
Calcium: 9.1 mg/dL (ref 8.4–10.5)
Chloride: 105 mEq/L (ref 96–112)
GLUCOSE: 129 mg/dL — AB (ref 70–99)
POTASSIUM: 4.4 meq/L (ref 3.5–5.3)
Sodium: 137 mEq/L (ref 135–145)
Total Protein: 7.1 g/dL (ref 6.0–8.3)

## 2014-11-09 LAB — FERRITIN: Ferritin: 70 ng/mL (ref 10–291)

## 2014-11-09 NOTE — Addendum Note (Signed)
Addended by: Jaynee Eagles on: 11/09/2014 03:09 PM   Modules accepted: Level of Service

## 2014-11-12 ENCOUNTER — Telehealth: Payer: Self-pay | Admitting: Urgent Care

## 2014-11-12 NOTE — Telephone Encounter (Signed)
Spoke with patient regarding her results, LFTs remain elevated. Advised patient reports clinic is she develops abdominal pain, nausea, vomiting, shortness of breath, fever. If LFTs continue to increase or her main elevated may consider switching from metformin. Continue iron supplement. Use Colace or MiraLax if she experiences constipation. Return to clinic in 3 months for followup or sooner if needed as above.

## 2014-11-12 NOTE — Telephone Encounter (Signed)
Pt called back and stated she will be available for you to call back.

## 2014-11-12 NOTE — Telephone Encounter (Signed)
Left message requesting call back to discuss lab results. Please ask patient for a reliable time for me to reach her so we can discuss her results in Spanish. Thank you!

## 2015-01-04 ENCOUNTER — Ambulatory Visit (INDEPENDENT_AMBULATORY_CARE_PROVIDER_SITE_OTHER): Payer: 59

## 2015-01-04 ENCOUNTER — Ambulatory Visit (INDEPENDENT_AMBULATORY_CARE_PROVIDER_SITE_OTHER): Payer: 59 | Admitting: Emergency Medicine

## 2015-01-04 VITALS — BP 119/80 | HR 78 | Temp 98.2°F | Resp 18 | Ht 63.5 in | Wt 233.0 lb

## 2015-01-04 DIAGNOSIS — S46912A Strain of unspecified muscle, fascia and tendon at shoulder and upper arm level, left arm, initial encounter: Secondary | ICD-10-CM | POA: Diagnosis not present

## 2015-01-04 DIAGNOSIS — M25512 Pain in left shoulder: Secondary | ICD-10-CM

## 2015-01-04 DIAGNOSIS — M6248 Contracture of muscle, other site: Secondary | ICD-10-CM | POA: Diagnosis not present

## 2015-01-04 DIAGNOSIS — M62838 Other muscle spasm: Secondary | ICD-10-CM

## 2015-01-04 LAB — POCT URINE PREGNANCY: Preg Test, Ur: NEGATIVE

## 2015-01-04 MED ORDER — CYCLOBENZAPRINE HCL 10 MG PO TABS: 10.0000 mg | ORAL_TABLET | Freq: Three times a day (TID) | ORAL | Status: DC | PRN

## 2015-01-04 MED ORDER — MELOXICAM 7.5 MG PO TABS: 7.5000 mg | ORAL_TABLET | Freq: Every day | ORAL | Status: DC

## 2015-01-04 NOTE — Progress Notes (Signed)
    MRN: [id number] DOB: April 29, 1971  Subjective:   Jackie Wells is a 44 y.o. female presenting for chief complaint of pain in left arm  HPI obtained in Spanish.  Reports 6 day history of left shoulder pain. Pain started during a Zumba class, while stretching patient feels like she started bearing too much weight on her left shoulder, has had pain since then. Pain is intermittent, sharp in nature and worse with sudden movements. She has tried using Bengay with temporary relief. Denies redness, bony deformity, swelling, hearing popping or tearing noises, numbness or tingling. Denies trauma, weakness. Denies history of shoulder surgeries. Denies any other aggravating or relieving factors, no other questions or concerns.  Jackie Wells has a current medication list which includes the following prescription(s): iron, lisinopril-hydrochlorothiazide, and metformin. She has No Known Allergies.  Jackie Wells  has a past medical history of Anemia and Diabetes mellitus without complication. Also  has no past surgical history on file.  ROS As in subjective.  Objective:   Vitals: BP 119/80 mmHg  Pulse 78  Temp(Src) 98.2 F (36.8 C) (Oral)  Resp 18  Ht 5' 3.5" (1.613 m)  Wt 233 lb (105.688 kg)  BMI 40.62 kg/m2  SpO2 98%  LMP 11/06/2014  Physical Exam  Constitutional: She is oriented to person, place, and time. She appears well-developed and well-nourished.  Cardiovascular: Normal rate.   Pulmonary/Chest: Effort normal.  Musculoskeletal:       Left shoulder: She exhibits decreased range of motion (abduction, extension), tenderness (with passive ROM testing) and spasm (over trapezius). She exhibits no bony tenderness, no swelling, no effusion, no crepitus, no deformity, no laceration and normal strength.  Positive Hawkins and Neers tests.  Neurological: She is alert and oriented to person, place, and time. She has normal reflexes.  Skin: Skin is warm and dry. No rash noted. No  erythema. No pallor.   [facility] reading (PRIMARY) by  Dr. Everlene Farrier and PA-Wing Gfeller. Left shoulder - negative.  Results for orders placed or performed in visit on 01/04/15 (from the past 24 hour(s))  POCT urine pregnancy     Status: None   Collection Time: 01/04/15  6:45 PM  Result Value Ref Range   Preg Test, Ur Negative Negative   Assessment and Plan :   This case was precepted with Dr. Arlyss Queen.  1. Left shoulder pain 2. Shoulder strain, left, initial encounter 3. Trapezius muscle spasm - Stable, patient will undergo conservative management. Recommended she use ice/hot packs, meloxicam for pain and inflammation, Flexeril for muscle spasms. Advised patient she modify her activity at work and offered a work note but patient refused. If no improvement in 2 weeks, consider PT or referral to ortho.  Jaynee Eagles, PA-C [facility] [phone] 01/04/2015 6:08 PM

## 2015-01-04 NOTE — Patient Instructions (Addendum)
Dolor en el hombro ( Shoulder Pain) El hombro es la articulacin que une los brazos al cuerpo. Los [employer] que forman la articulacin del hombro son el hueso del brazo (hmero), el omplato (escpula) y Teacher, early years/pre. La parte superior del hmero es similar a una bola y Sales promotion account executive en una cavidad ms bien plana en la escpula (cavidad glenoidea). Una combinacin de msculos y tejidos fuertes y fibrosos que [employer] msculos a los huesos (tendones) soportan la articulacin del hombro y Tree surgeon la bola en la cavidad. En diferentes zonas de la articulacin hay pequeas bolsas llenas de lquido (bursa). Actan como amortiguadores [employer] Company y los tejidos blandos que recubren y [place] a reducir la friccin [employer] tendones y el hueso al mover el brazo. La articulacin del hombro permite una amplia gama de movimientos del brazo. Este rango de movimientos permite hacer diferentes cosas,como rascarse la espalda o lanzar una pelota. Sin embargo, esta amplitud de movimientos del hombro tambin lo hace ms propenso al dolor por uso excesivo y por lesiones. Las causas de dolor en el hombro pueden originarse tanto en las lesiones como en el uso excesivo y se pueden agrupar en las siguientes cuatro categoras:  Enrojecimiento, hinchazn y dolor (inflamacin) del tendn (tendinitis) o la bursa (bursitis).  Inestabilidad, como en la luxacin de la articulacin.  Inflamacin de la articulacin (artritis).  Un hueso roto Stage manager). INSTRUCCIONES PARA EL CUIDADO EN EL HOGAR   Aplique hielo sobre la zona dolorida.  Ponga el hielo en una bolsa plstica.  Colquese una toalla entre la piel y la bolsa de hielo.  Deje el hielo durante 15 a 66minutos, 3 a 4veces por [employer] 2 primeros das, o segn las indicaciones del mdico.  Deje de usar compresas fras si no Forensic psychologist.  Si tiene un cabestrillo o inmovilizador de hombro, llvelo del modo en que su mdico le indique. Solo debe quitrselo  para ducharse o baarse. Mueva el brazo lo menos posible, pero mantenga la mano en movimiento para evitar la hinchazn.  Apriete una pelota blanda o una almohadilla de goma todo lo posible para evitar la hinchazn.  Utilice los medicamentos de venta libre o recetados para Glass blower/designer, Health and safety inspector o la fiebre, segn se lo indique el mdico. SOLICITE ATENCIN MDICA SI:   El dolor en el hombro aumenta o siente un dolor nuevo en el brazo, la mano o los dedos.  La mano o los dedos estn fros y adormecidos.  El dolor no se alivia con los [employer]. SOLICITE ATENCIN MDICA DE INMEDIATO SI:   El brazo, la mano o los dedos estn adormecidos o siente hormigueos.  El brazo, la mano o los dedos estn muy hinchados o se ven blancos o azules. ASEGRESE DE QUE:   Comprende estas instrucciones.  Controlar su afeccin.  Recibir ayuda de inmediato si no mejora o si empeora. Document Released: 02/25/2005 Document Revised: 10/02/2013 [facility] Patient Information 2015 Grants. This information is not intended to replace advice given to you by your health care provider. Make sure you discuss any questions you have with your health care provider.   Patient is to perform exercises below at 5 sets with 10 repetitions. Stretches are to be performed for 5 sets, 10 seconds each. Recommended patient perform this rehab twice daily within pain tolerance for 2 weeks.  Sndrome de atrapamiento, Tendinitis del manguito rotador, bursitis con rehabilitacin (Impingement Syndrome, Rotator Cuff, Bursitis, with Rehab) El sndrome de atrapamiento est caracterizado  por la inflamacin de los tendones del manguito rotador o la bursa subacromial, que produce dolor en el hombro. El manguito rotador est formado por cuatro tendones y msculos que controlan gran parte de la funcin del hombro y Soil scientist. La bursa subacromial es un saco lleno de lquido que ayuda a reducir la friccin entre el manguito rotador y  uno de los huesos del hombro (acromion). El sndrome de atrapamiento suele ser Ardelia Mems lesin por uso excesivo que causa inflamacin de la bursa (bursitis), inflamacin del tendn (tendinitis), o un desgarro del tendn (esguince). Los esguinces se clasifican en tres categoras. Los esguinces de grado 1 ocasionan dolor, pero el tendn no est alargado. En los esguinces de grado 2 hay un ligamento alargado, debido a un estiramiento o desgarro parcial. En el esguince de [place] 2 an se conserva la funcin, aunque puede estar disminuda. Un esguince en grado 3 es la ruptura completa del msculo o el tendn, y suele quedar incapacitada la funcin. SNTOMAS  Dolor alrededor del hombro, frecuentemente en la porcin externa de la parte superior del brazo.  El dolor empeora al mover el hombro, en especial cuando se lo levanta o se lo coloca por encima de la cabeza.  En ocasiones hay dolor cuando no se utiliza el brazo.  El dolor puede despertarlo durante la noche.  A veces, puede sentir dolor, sensibilidad, hinchazn, calor y enrojecimiento en la zona afectada.  Prdida de la fuerza.  Movimiento limitado del hombro, en especial para moverlo hacia atrs (cmo para alcanzar el bolsillo trasero o desabrocharse el sostn) o al cruzar el cuerpo.  Ruido de "crack" (crepitacin) al Forensic scientist.  Puede sentir dolor en el tendn del bceps e hinchazn (en la zona anterior del hombro). Empeora al doblar el codo o levantar peso. CAUSAS El sndrome de compresin es a menudo una lesin por abuso, [employer] movimientos crnicos (repetitivos) ocasionan que los tendones o la bursa se inflamen. El esguince se produce cuando se produce una fuerza en el tendn o msculo que es mayor de lo que puede soportar. Los mecanismos ms frecuentes de una lesin son: Esguince provocado por un brusco incremento en la duracin, frecuencia o en la intensidad del entrenamiento.  Golpe directo en el hombro  (traumatismo).  Envejecimiento, degeneracin del tendn con el uso normal.  Bulto seo en el hombro (osteofito acromial). LOS RIESGOS AUMENTAN CON  Deportes de contacto (ftbol, lucha o boxeo).  Deportes en los que hay que arrojar objetos (bisbol, tenis y vley).  Levantamiento de pesas y fisicoculturismo.  Trabajos pesados.  Lesin previa en el manguito rotador, inclusive choque.  Poca fuerza y flexibilidad del hombro.  Precalentamiento y elongacin inadecuados antes de la [place].  Equipo protector inadecuado.  La edad  Bulto seo en el hombro (osteofito acromial). MEDIDAS DE PREVENCIN  Precalentamiento adecuado y elongacin antes de la [place].  Descanso y recuperacin entre actividades.  Mantener la forma fsica:  Kerry Hough, flexibilidad y resistencia muscular.  Capacidad cardiovascular.  Aprenda y [employer]. PRONSTICO Si se trata adecuadamente, por lo general desaparece en 6 semanas. En algunos casos se requiere [place].  POSIBLES COMPLICACIONES:  Tiempo de curacin prolongado, si no se le da el tiempo suficiente como para curarse.  La recurrencia frecuente de los sntomas puede dar como resultado un problema crnico.  Rigidez, parlisis del hombro o prdida de movimiento.  Desgarro del tendn del Clinical research associate.  Recurrencia de sntomas, en especial si se retoma la actividad rpidamente, con el  uso excesivo, con un golpe directo o con tcnicas incorrectas. CONSIDERACIONES GENERALES PARA EL TRATAMIENTO El tratamiento inicial consiste en la toma de medicamentos y la aplicacin de hielo para Best boy y reducir la hinchazn. Los ejercicios de elongacin y fortalecimiento pueden ayudar a reducir Conservation officer, historic buildings con la [place]. Los ejercicios pueden Press photographer o con un terapeuta. Si no se obtiene xito con Music therapist, ser necesario someterse a [place]. Luego de la [place] y rehabilitacin, es posible  regresar a Engineer, manufacturing systems en 3 meses.  MEDICAMENTOS   Si es necesaria la administracin de medicamentos para Conservation officer, historic buildings, se recomiendan los antiinflamatorios no esteroides, (aspirina e ibuprofeno) u otros calmantes menores (acetaminofeno).  No tome medicamentos para el dolor dentro de los 7 das previos a la [place].  El profesional podr prescribirle calmantes si lo considera necesario. Utilcelos como se le indique y slo cuando lo necesite.  En algunos casos se indica una inyeccin de corticosteroides. Estas inyecciones deben reservarse para los casos graves, porque slo se pueden administrar una determinada cantidad de veces. CALOR Y FRO:   El fro debe aplicarse durante 10 a 15 minutos cada 2  3 horas para reducir la inflamacin y Conservation officer, historic buildings e inmediatamente despus de cualquier actividad que agrava los sntomas. Utilice bolsas o un masaje de hielo.  El calor puede usarse antes de Neurosurgeon y Stanley fortalecimiento indicadas por el profesional, el fisioterapeuta o Industrial/product designer. Utilice una bolsa trmica o un pao hmedo. SOLICITE ATENCIN MDICA SI:   Los sntomas empeoran o no mejoran en 4 a 6 semanas, an realizando Lexicographer.  Desarrolla nuevos e inexplicables sntomas. (las drogas [employer] en el tratamiento le ocasionan efectos secundarios). EJERCICIOS  EJERCICIOS DE AMPLITUD DE MOVIMIENTOS Y ELONGACIN - Sndrome de choque (manguito rotador tendinitis, bursitis) Estos ejercicios le ayudarn en la recuperacin de la lesin. Los sntomas podrn desaparecer con o sin mayor intervencin del profesional, el fisioterapeuta o Industrial/product designer. Al completar estos ejercicios, recuerde:   Restaurar la flexibilidad del tejido ayuda a que las articulaciones recuperen el movimiento normal. Esto permite que el movimiento y la actividad sea ms saludables y menos dolorosos.  Para que sea efectiva, cada elongacin debe realizarse durante al menos 30 segundos.  La  elongacin nunca debe ser dolorosa. Deber sentir slo un alargamiento suave o elongacin del tejido que estira. FUERZA - Flexin, de pie  Pngase de pie con una buena postura. Con un agarre en supinacin en su mano derecha / izquierdo, y un agarre en pronacin de la mano opuesta, agarre un palo de escoba o caa de modo que sus manos queden un poco ms separadas que el ancho de los hombros.  Con los msculos del codo Recruitment consultant / izquierdo rectos y los hombros relajados, empuje el bastn con la mano opuesta, para elevar su brazo derecha / izquierdo delante de su cuerpo y por encima de la cabeza. Levante el brazo hasta que sientas un estiramiento en el hombro derecha / izquierdo, pero sin sentir un aumento del dolor.  Trate de Scientist, physiological / izquierdo a Public affairs consultant, y [place] el omplato inclinado hacia abajo y hacia la espina dorsal media de la espalda. Mantenga esta posicin durante __________ segundos.  Vuelva lentamente a la posicin inicial. Reptalo __________ veces. Realice este estiramiento __________ Vicenta Aly por da. FUERZA - Abduccin, posicin supina  Acustese sobre la espada. Tome un palo de escoba o caa con una palma  derecha / izquierdo Wendi Maya y la otra palma hacia arriba de modo que sus manos tengan una separacin de un poco ms que el ancho de sus hombros..  Con los msculos del codo Recruitment consultant / izquierdo rectos y los hombros relajados, empuje el bastn con la mano opuesta, para elevar su brazo derecha / izquierdo delante de su cuerpo y por encima de la cabeza. Levante el brazo hasta que sientas un estiramiento en el hombro derecha / izquierdo, pero sin sentir un aumento del dolor.  Trate de Scientist, physiological / izquierdo a Public affairs consultant, y Rafael Gonzalez el omplato inclinado hacia abajo y hacia la espina dorsal media de la espalda. Mantenga esta posicin durante __________ segundos.  Vuelva lentamente a la posicin  inicial. Reptalo __________ veces. Realice este estiramiento __________ Vicenta Aly por da.  ROM - Flexin, asistida activa  Acustese sobre la espada. Podr doblar las rodillas para estar ms cmodo  Tome un palo de escoba o un bastn de modo que sus manos queden a la misma distancia que sus hombros. La mano derecha / izquierdo debe sujetar el extremo del palo, de modo que la mano est "pulgares arriba", como si estuviera a punto de dar la [place].  Con su brazo sano para Forensic psychologist, Engineer, maintenance (IT) / izquierdo sobre la cabeza, Nurse, children's que sienta un suave estiramiento en el hombro. Mantenga esta posicin durante __________ segundos.  Utilice el bastn para ayudarse a Risk analyst / izquierdo a la posicin inicial. Reptalo __________ veces. Realice este estiramiento __________ Vicenta Aly por da.  ROM - Rotacin interna, en posicin supina  Recustese sobre su espalda en una superficie firme. Coloque el codo derecha / izquierdo a 31 grados de distancia de su lado. Eleve el codo con una toalla doblada, de manera que el codo y el hombro estn a la misma altura.  Utilice un palo de escoba o caa y su brazo sano, tire de su mano derecha / izquierdo hacia su cuerpo hasta que sienta un suave estiramiento, pero ningn aumento en su dolor en el hombro. Mantenga su hombro y el codo en su lugar durante todo el ejercicio.  Mantenga esta posicin durante __________ segundos. Vuelva lentamente a la posicin inicial. Reptalo __________ veces. Realice este estiramiento __________ Vicenta Aly por da. ELONGACIN - Rotacin interna  Coloque la mano derecha / izquierdo detrs de la espalda, con la palma [place] arriba.  Coloque una toalla o un cinturn sobre el hombro opuesto. Tome la toalla con la mano derecha / izquierdo.  Con una postura erguida, tire suavemente hacia arriba la [place], hasta que sienta un estiramiento en la parte frontal del hombro derecha / izquierdo.  Trate de Retail buyer / izquierdo a Public affairs consultant, y [place] el omplato inclinado hacia abajo y hacia la espina dorsal media de la espalda.  Mantenga esta posicin durante __________ segundos. Afloje la elongacin bajando la mano sana. Reptalo __________ veces. Realice este estiramiento __________ Vicenta Aly por da.  ROM - Rotacin interna  Tome un palo con ambas manos por detrs de la espalda, con las palmas [place].  De pie y erguido en buena postura, deslice el palo hacia arriba por la espalda hasta sentir un suave estiramiento en la zona anterior de los hombros.  Mantenga esta posicin durante __________ segundos. Vuelva lentamente a la posicin inicial. Reptalo __________ veces. Realice este estiramiento __________ Vicenta Aly por da.  ELONGACIN - Cpsula posterior del hombro  Prese o  sintese en una postura correcta. Cruce el hombro derecha / izquierdo Water engineer, y Environmental consultant a la misma altura que el hombro.  Tire del codo de [employer] el brazo quede cerca de su pecho. Tire hasta que sienta un estiramiento en la parte trasera del hombro.  Mantenga esta posicin durante __________ segundos. Reptalo __________ veces. Realice este estiramiento __________ Vicenta Aly por da. EJERCICIOS DE ESTIRAMIENTO - Sndrome de choque (manguito rotador, tendinitis, bursitis) Estos ejercicios le ayudarn en la recuperacin de la lesin. Los sntomas podrn desaparecer con o sin mayor intervencin del profesional, el fisioterapeuta o Industrial/product designer. Al completar estos ejercicios, recuerde:   Los msculos pueden ganar tanto la resistencia como la fuerza que necesita para sus actividades diarias a travs de ejercicios controlados.  Realice los ejercicios como se lo indic el mdico, el fisioterapeuta o Industrial/product designer. Aumente la resistencia y las repeticiones segn se le haya indicado.  Podr experimentar dolor o cansancio muscular, pero el dolor o molestia que trata de eliminar a travs de los  ejercicios nunca debe empeorar. Si el dolor empeora, detngase y asegrese de que est siguiendo las directivas correctamente. Si an siente dolor luego de Optometrist lo ajustes necesarios, deber discontinuar el ejercicio hasta que pueda conversar con el profesional sobre el problema.  Durante la recuperacin, evite las actividades o ejercicios que impliquen acciones que le obliguen a Glass blower/designer la mano lesionada o el codo encima de la cabeza o detrs de la espalda o la cabeza. Estas posturas tensionan los tejidos que estn tratando de Scientist, research (medical). FUERZA - Depresin y aduccin escapular   Sintese con una buena postura en una silla firme. Apoye los brazos delante de usted, con [place], reposabrazos, o sobre una mesa. Mantenga los codos en lnea con los lados de su cuerpo.  Lleve suavemente los omplatos hacia abajo y hacia la zona media posterior de la columna. Aumente gradualmente la tensin, sin tensar los msculos de la parte superior de los hombros y la parte posterior de su cuello.  Mantenga esta posicin durante __________ segundos. Libere lentamente la tensin y relaje los msculos antes de iniciar la siguiente repeticin.  Despus de haber practicado este ejercicio, quite el soporte del brazo y complete el ejercicio de pie, de la misma manera que la posicin de sentado. Reptalo __________ veces. Realice este estiramiento __________ Vicenta Aly por da.  FUERZA - Abductores del hombro, isomtrica  Con una buena postura, de pie o sentado cerca de 4-6 pulgadas de la pared, con su lado derecha / izquierdo frente al muro.  Doble el codo derecha / izquierdo. Empuje suavemente el codo derecha / izquierdo contra la pared. Aumente gradualmente la presin [employer] pueda sin llegar a encojer el hombro ni Corporate treasurer.  Mantenga esta posicin durante __________ segundos.  Libere la tensin lentamente. Relaje los msculos de los hombros antes de empezar la siguiente repeticin. Reptalo __________ veces.  Realice este estiramiento __________ Vicenta Aly por da.  FUERZA - Rotacin externa, isomtrica  Mantenga el codo derecha / izquierdo a su lado y dblelo a 35 grados.  Prese en un marco de puerta para que el exterior de su Forensic scientist / izquierdo pueda presionar contra ste sin separar su brazo de su lado.  Con suavidad presione la Forensic scientist / izquierdo IT sales professional de la puerta, como si estuviera tratando de llevar el dorso de su mano lejos de su estmago. Aumente la presin de forma gradual, tan fuerte como usted pueda, sin encogerse de hombros ni sentir un  aumento de las [employer].  Mantenga esta posicin durante __________ segundos.  Libere la tensin lentamente. Relaje los msculos de los hombros antes de empezar la siguiente repeticin. Reptalo __________ veces. Realice este estiramiento __________ Vicenta Aly por da.  [place] de pie o sintese con una buena postura. Sostenga un peso de __________ Merla Riches banda de goma para ejercicios, de modo que su mano quede con el pulgar hacia arriba, como cuando [employer].  Levante lentamente el brazo derecha / izquierdo separndolo del muslo en forma de "V", en diagonal por el espacio entre el costado y el frente. Levante la mano [employer] altura del hombro o tanto como pueda, sin Corporate treasurer. Al principio, muchas personas no pueden levantar las manos por arriba de la altura del hombro.  Trate de Scientist, physiological / izquierdo a Public affairs consultant, y [place] el omplato inclinado hacia abajo y hacia la espina dorsal media de la espalda.  Mantenga esta posicin durante __________ segundos. Controle el descenso de la mano de modo que vuelva a la posicin inicial lo ms lentamente posible. Reptalo __________ veces. Realice este estiramiento __________ Vicenta Aly por da.  FUERZA - Rotadores externos  Asegure una banda o tubo de goma a un objeto fijo (mesa, columna) de modo que  quede a la misma altura que su codo Recruitment consultant / izquierdo Advertising account planner se encuentre de pie o sentado sobre una superficie firme.  Prese o sintese de [employer] banda de goma se encuentre de su lado lesionado.  Doble el codo derecha / izquierdo a 90 grados. Coloque una toalla doblada o una pequea almohada debajo de su brazo derecha / izquierdo de modo que su codo quede algunas pulgadas separado de su cuerpo.  Manteniendo la tensin en la banda de goma, tire de la misma hacia afuera de su cuerpo, como pivoteando en el codo. Asegrese de [employer] su cuerpo derecho, de modo que el movimiento slo provenga de la rotacin del hombro.  Mantenga esta posicin durante __________ segundos. Libere la tensin de modo controlado mientras vuelve a la posicin inicial. Reptalo __________ veces. Realice este estiramiento __________ Vicenta Aly por da.  FUERZA - Rotadores internos  Asegure una banda o tubo de goma a un objeto fijo (mesa, columna) de modo que quede a la misma altura que su codo Recruitment consultant / izquierdo Advertising account planner se encuentre de pie o sentado sobre una superficie firme.  Prese o sintese de [employer] banda de goma se encuentre de su lado derecha / izquierdo.  Doble el codo a 60 grados. Coloque una toalla doblada o una pequea almohada debajo de su brazo derecha / izquierdo de modo que su codo quede algunas pulgadas separado de su cuerpo.  Manteniendo la tensin en la banda, tire de la misma cruzando sobre su cuerpo, [place]. Asegrese de [employer] su cuerpo derecho, de modo que el movimiento slo provenga de la rotacin del hombro.  Mantenga esta posicin durante __________ segundos. Libere la tensin de modo controlado mientras vuelve a la posicin inicial. Reptalo __________ veces. Realice este estiramiento __________ Vicenta Aly por da.  FUERZA - Protractor escapular - de pie  Prese la distancia de sus brazos separado de la pared. Coloque las [employer] pared, [employer] codos  derechos.  Comience con los omplatos hacia abajo y hacia la zona media posterior de la columna.  Para fortalecer los extensores, mantenga los omplatos [place], [place] deslcelos hacia adelante [place]  el trax. Sentir como si estuviera separada la zona posterior del trax de la pared. Es movimiento sutil y puede ser difcil de Optometrist. Consulte con su mdico para que d ms instrucciones si no est seguro de Educational psychologist.  Mantenga esta posicin durante __________ segundos. Vuelva lentamente a la posicin inicial, y haga descansar completamente los msculos antes de repetir. Reptalo __________ veces. Realice este estiramiento __________ Vicenta Aly por da. FUERZA Interior and spatial designer - en posicin [place]  Recustese sobre su espalda en una superficie firme. Extienda su brazo derecha / izquierdo recto en el aire mientras sostiene un peso de __________ en la mano.  Mantenga la cabeza y la espalda en su lugar, levante los hombros del piso.  Mantenga esta posicin durante __________ segundos. Vuelva lentamente a la posicin inicial y relaje los msculos antes de iniciar la siguiente repeticin. Reptalo __________ veces. Realice este estiramiento __________ Vicenta Aly por da. [place] en las rodillas y las manos, con los hombros directamente sobre las manos (o tan prximos como pueda).  Manteniendo los hombros trabados, levante la zona posterior del trax hacia los omplatos, de modo que la zona media de la espalda se redondee. Mantenga los msculos del cuello relajados.  Mantenga esta posicin durante __________ segundos. Vuelva lentamente a la posicin inicial y relaje los msculos antes de iniciar la siguiente repeticin. Reptalo __________ veces. Realice este estiramiento __________ Vicenta Aly por da.  FUERZA - Retractor escapular  Asegure una banda o tubo de goma a un objeto fijo (mesa, columna) de modo que quede a la misma altura  que sus hombros mientras se encuentre de pie o sentado en una silla firme sin apoyabrazos.  Con las palmas [place], tome un extremo de la banda con cada mano. Enderece los codos y levante las manos directamente frente usted, a la altura de los hombros. [place] hacia atrs, alejndose del extremo fijo de la banda, [place] que se tense.  Tratando de juntar presionando los omplatos, lleve los codos hacia atrs [employer] dobla. Mantenga los brazos separados del cuerpo durante todo el ejercicio.  Mantenga esta posicin durante __________ segundos. Lentamente libere la tensin de la banda, volviendo a la posicin inicial. Reptalo __________ veces. Realice este estiramiento __________ Vicenta Aly por da. FUERZA - Extensores del hombro  Asegure una banda o tubo de goma a un objeto fijo (mesa, columna) de modo que quede a la misma altura que sus hombros mientras se encuentre de pie o sentado en una silla firme sin apoyabrazos.  Con los pulgares [place], tome un extremo de la banda con cada mano. Enderece los codos y levante las manos directamente frente usted, a la altura de los hombros. Camine hacia atrs, alejndose del extremo fijo de la banda, [place] que se tense.  Presionando los omplatos para juntarlos, lleve las manos a los lados de los muslos. No deje que las manos vayan ms all.  Mantenga esta posicin durante __________ segundos. Lentamente libere la tensin de la banda, volviendo a la posicin inicial. Reptalo __________ veces. Realice este estiramiento __________ Vicenta Aly por da.  FUERZA - Retractores escapulares y rotadores externos  Asegure una banda o tubo de goma a un objeto fijo (mesa, columna) de modo que quede a la misma altura que sus hombros mientras se encuentre de pie o sentado en una silla firme sin apoyabrazos.  Con las palmas [place], tome un extremo de la banda con cada mano. Doble los hombros a 69 grados y levante  los codos [employer] altura de los hombros, a los  lados. Camine hacia atrs, alejndose del extremo fijo de la banda, [place] que se tense.  Presionando los omplatos para juntarlos, rote los hombros de modo que la zona superior de los brazos y codos Conservation officer, historic buildings quietos, [employer] puos se eleven hasta la altura de la cabeza.  Mantenga esta posicin durante __________ segundos. Lentamente libere la tensin de la banda, volviendo a la posicin inicial. Reptalo __________ veces. Realice este estiramiento __________ Vicenta Aly por da.  FUERZA - Retractores escapulares y rotadores externos - remo  Asegure una banda o tubo de goma a un objeto fijo (mesa, columna) de modo que quede a la misma altura que sus hombros mientras se encuentre de pie o sentado en una silla firme sin apoyabrazos.  Con las palmas [place], tome un extremo de la banda con cada mano. Enderece los codos y levante las manos directamente frente usted, a la altura de los hombros. Camine hacia atrs, alejndose del extremo fijo de la banda, [place] que se tense.  Paso 1: Apriete ambos omplatos. Doble los codos, lleve las manos al pecho, como si [place]. Al final de [employer], las manos y codos deben quedar a la altura del hombro, y los codos deben estar separados de los lados.  Paso 2: Rote los hombros, para Community education officer las manos por arriba de la cabeza. Los antebrazos deben quedar verticales y la zona superior de los brazos debe quedar horizontal.  Mantenga esta posicin durante __________ segundos. Lentamente libere la tensin de la banda, volviendo a la posicin inicial. Reptalo __________ veces. Realice este estiramiento __________ Vicenta Aly por da.  FUERZA - Depresores escapulares  Busque una silla maciza sin ruedas, como una silla de comedor.  Manteniendo los pies sobre el piso y las manos en los apoyabrazos, levante las nalgas del asiento y trabe los codos.  Manteniendo los codos rectos, deje que la gravedad empuje su cuerpo [place] abajo. Los hombros se elevarn [employer].  Eleve el cuerpo oponindose a la gravedad, llevando los omplatos hacia la espalda y acortando la distancia entre los hombros y [place]. Aunque los pies mantengan contacto con el piso, deben soportar cada vez menos peso, a medida que se fortalece.  Mantenga esta posicin durante __________ segundos. De modo lento y controlado, baje el cuerpo para repetir. Reptalo __________ veces. Realice este estiramiento __________ Vicenta Aly por da.  Document Released: 03/04/2006 Document Revised: 08/10/2011 [facility] Patient Information 2015 [place]. This information is not intended to replace advice given to you by your health care provider. Make sure you discuss any questions you have with your health care provider.

## 2015-07-08 ENCOUNTER — Ambulatory Visit (INDEPENDENT_AMBULATORY_CARE_PROVIDER_SITE_OTHER): Payer: BLUE CROSS/BLUE SHIELD | Admitting: Family Medicine

## 2015-07-08 VITALS — BP 122/72 | HR 73 | Temp 98.7°F | Resp 16 | Ht 64.0 in | Wt 235.0 lb

## 2015-07-08 DIAGNOSIS — I8393 Asymptomatic varicose veins of bilateral lower extremities: Secondary | ICD-10-CM

## 2015-07-08 DIAGNOSIS — E1165 Type 2 diabetes mellitus with hyperglycemia: Secondary | ICD-10-CM | POA: Diagnosis not present

## 2015-07-08 DIAGNOSIS — Z23 Encounter for immunization: Secondary | ICD-10-CM | POA: Diagnosis not present

## 2015-07-08 DIAGNOSIS — M542 Cervicalgia: Secondary | ICD-10-CM

## 2015-07-08 DIAGNOSIS — IMO0001 Reserved for inherently not codable concepts without codable children: Secondary | ICD-10-CM

## 2015-07-08 LAB — LIPID PANEL
CHOL/HDL RATIO: 4.3 ratio (ref <=5.0)
CHOLESTEROL: 187 mg/dL (ref 125–200)
HDL: 44 mg/dL — AB (ref >=46)
LDL Cholesterol: 109 mg/dL (ref <130)
Triglycerides: 170 mg/dL — ABNORMAL HIGH (ref <150)
VLDL: 34 mg/dL — ABNORMAL HIGH (ref <30)

## 2015-07-08 LAB — COMPREHENSIVE METABOLIC PANEL
ALK PHOS: 47 U/L (ref 33–115)
ALT: 39 U/L — AB (ref 6–29)
AST: 30 U/L (ref 10–30)
Albumin: 4.3 g/dL (ref 3.6–5.1)
BILIRUBIN TOTAL: 0.4 mg/dL (ref 0.2–1.2)
BUN: 9 mg/dL (ref 7–25)
CO2: 25 mmol/L (ref 20–31)
Calcium: 9.4 mg/dL (ref 8.6–10.2)
Chloride: 100 mmol/L (ref 98–110)
Creat: 0.59 mg/dL (ref 0.50–1.10)
GLUCOSE: 112 mg/dL — AB (ref 65–99)
Potassium: 4.2 mmol/L (ref 3.5–5.3)
Sodium: 138 mmol/L (ref 135–146)
Total Protein: 7.4 g/dL (ref 6.1–8.1)

## 2015-07-08 LAB — HEMOGLOBIN A1C
Hgb A1c MFr Bld: 8.2 % — ABNORMAL HIGH (ref <5.7)
Mean Plasma Glucose: 189 mg/dL — ABNORMAL HIGH (ref <117)

## 2015-07-08 MED ORDER — METHOCARBAMOL 500 MG PO TABS: 500.0000 mg | ORAL_TABLET | Freq: Every evening | ORAL | Status: DC | PRN

## 2015-07-08 NOTE — Progress Notes (Signed)
[facility] [address], [place] [address] [phone]  Date:  07/08/2015   Name:  Jackie Wells   DOB:  1971-03-17   MRN:  [id number]  PCP:  Harvie Junior, MD    Chief Complaint: Diabetes and Neck Pain   History of Present Illness:  Jackie Wells is a 45 y.o. very pleasant female patient who presents with the following:  History of DM- she is on metformin 1,000 BID. Needs to check on her labs today Also has concern of pain in the back of her head.  She noted this for a few days- she went and had a massage yesterday at the mall and her pain is now resolved She also has varicose veins in her left leg- she was seen [facility] the [facility] about 3 years ago. She does not recall quite what was done at that time, but states that her legs are now more painful and she would be interested in any treatment that may be available She reports that she has had an eye exam in the last year but is unsure of the date  She is fasting today for labs   She is due for a flu shot  LMP was 2 weeks ago- she knows that pregnancy is contraindicated with her ACE and she does not plan to become pregnant   Lab Results  Component Value Date   HGBA1C 8.4 11/08/2014     Patient Active Problem List   Diagnosis Date Noted  . Left shoulder pain 01/04/2015  . Diabetes type 2, uncontrolled ([place]) 11/09/2014  . Morbid obesity ([place]) 08/04/2014  . Essential hypertension 08/04/2014    Past Medical History  Diagnosis Date  . Anemia   . Diabetes mellitus without complication ([place])     History reviewed. No pertinent past surgical history.  Social History  Substance Use Topics  . Smoking status: Never Smoker   . Smokeless tobacco: Never Used  . Alcohol Use: No    History reviewed. No pertinent family history.  No Known Allergies  Medication list has been reviewed and updated.  Current Outpatient Prescriptions on File Prior to Visit   Medication Sig Dispense Refill  . Ferrous Sulfate (IRON) 325 (65 FE) MG TABS Take 1 tablet by mouth daily. 90 each 3  . lisinopril-hydrochlorothiazide (PRINZIDE,ZESTORETIC) 10-12.5 MG per tablet Take 1 tablet by mouth daily. 90 tablet 3  . metFORMIN (GLUCOPHAGE) 1000 MG tablet Take 1 tablet (1,000 mg total) by mouth 2 (two) times daily with a meal. 180 tablet 3   No current facility-administered medications on file prior to visit.    Review of Systems:  As per HPI- otherwise negative.   Physical Examination: Filed Vitals:   07/08/15 1305  BP: 122/72  Pulse: 73  Temp: 98.7 F (37.1 C)  Resp: 16   Filed Vitals:   07/08/15 1305  Height: 5\' 4"  (1.626 m)  Weight: 235 lb (106.595 kg)   Body mass index is 40.32 kg/(m^2). Ideal Body Weight: Weight in (lb) to have BMI = 25: 145.3  GEN: WDWN, NAD, Non-toxic, A & O x 3, obese, looks well HEENT: Atraumatic, Normocephalic. Neck supple. No masses, No LAD. No current tenderness in her scalp or neck, no meningismus Ears and Nose: No external deformity. CV: RRR, No M/G/R. No JVD. No thrill. No extra heart sounds. PULM: CTA B, no wheezes, crackles, rhonchi. No retractions. No resp. distress. No accessory muscle use.Marland Kitchen EXTR: No c/c/e. Multiple  varicose veins on both legs, left more than right NEURO Normal gait.  PSYCH: Normally interactive. Conversant. Not depressed or anxious appearing.  Calm demeanor.    Assessment and Plan: Uncontrolled type 2 diabetes mellitus without complication, without long-term current use of insulin ([place]) - Plan: Hemoglobin A1c, Comprehensive metabolic panel, Lipid panel, Microalbumin, urine  Varicose veins of both lower extremities - Plan: Ambulatory referral to Vascular Surgery  Immunization due - Plan: Flu Vaccine QUAD 36+ mos IM  Neck pain - Plan: methocarbamol (ROBAXIN) 500 MG tablet  Await labs and will be in touch with her Robaxin if needed for neck pain See patient instructions for more details.      Signed Lamar Blinks, MD

## 2015-07-08 NOTE — Patient Instructions (Addendum)

## 2015-07-09 ENCOUNTER — Encounter: Payer: Self-pay | Admitting: Family Medicine

## 2015-07-09 LAB — MICROALBUMIN, URINE: MICROALB UR: 0.9 mg/dL

## 2015-08-06 ENCOUNTER — Other Ambulatory Visit: Payer: Self-pay | Admitting: *Deleted

## 2015-08-06 DIAGNOSIS — I83893 Varicose veins of bilateral lower extremities with other complications: Secondary | ICD-10-CM

## 2015-08-07 ENCOUNTER — Ambulatory Visit (HOSPITAL_COMMUNITY)
Admission: RE | Admit: 2015-08-07 | Discharge: 2015-08-07 | Disposition: A | Discharge status: Home / Self Care | Payer: BLUE CROSS/BLUE SHIELD | Source: Ambulatory Visit | Attending: Vascular Surgery | Admitting: Vascular Surgery

## 2015-08-07 DIAGNOSIS — E119 Type 2 diabetes mellitus without complications: Secondary | ICD-10-CM | POA: Diagnosis not present

## 2015-08-07 DIAGNOSIS — I83893 Varicose veins of bilateral lower extremities with other complications: Secondary | ICD-10-CM | POA: Insufficient documentation

## 2015-08-07 DIAGNOSIS — R609 Edema, unspecified: Secondary | ICD-10-CM | POA: Diagnosis present

## 2015-08-09 ENCOUNTER — Encounter: Payer: Self-pay | Admitting: Vascular Surgery

## 2015-08-13 ENCOUNTER — Ambulatory Visit (INDEPENDENT_AMBULATORY_CARE_PROVIDER_SITE_OTHER): Payer: BLUE CROSS/BLUE SHIELD | Admitting: Vascular Surgery

## 2015-08-13 ENCOUNTER — Encounter: Payer: Self-pay | Admitting: Vascular Surgery

## 2015-08-13 VITALS — BP 125/77 | HR 100 | Temp 98.2°F | Resp 16 | Ht 64.0 in | Wt 235.0 lb

## 2015-08-13 DIAGNOSIS — I83892 Varicose veins of left lower extremities with other complications: Secondary | ICD-10-CM

## 2015-08-13 NOTE — Progress Notes (Signed)
Subjective:     Patient ID: Jackie Wells, female   DOB: 1970-07-18, 45 y.o.   MRN: [id number]  HPI This 45 year old female is evaluated for painful varicosities in the left leg. She was previously seen by me in 2012 and had a bleeding varix 2 in the left lateral ankle area which is treated with sclerotherapy. Since then she has developed enlarging painful varicosities particularly in the lateral thigh in the pretibial region and 1+ edema distally. She has tried elastic compression stockings on occasion with no success. Has no history of DVT thrombophlebitis stasis ulcers or any subsequent bleeding. No symptoms in the contralateral right leg. Her symptoms are worsening however and they consist of burning itching and throbbing discomfort which worsened as the day progresses.  Past Medical History  Diagnosis Date  . Anemia   . Diabetes mellitus without complication [facility])     Social History  Substance Use Topics  . Smoking status: Never Smoker   . Smokeless tobacco: Never Used  . Alcohol Use: No    History reviewed. No pertinent family history.  No Known Allergies   Current outpatient prescriptions:  .  Ferrous Sulfate (IRON) 325 (65 FE) MG TABS, Take 1 tablet by mouth daily., Disp: 90 each, Rfl: 3 .  lisinopril-hydrochlorothiazide (PRINZIDE,ZESTORETIC) 10-12.5 MG per tablet, Take 1 tablet by mouth daily., Disp: 90 tablet, Rfl: 3 .  metFORMIN (GLUCOPHAGE) 1000 MG tablet, Take 1 tablet (1,000 mg total) by mouth 2 (two) times daily with a meal., Disp: 180 tablet, Rfl: 3 .  methocarbamol (ROBAXIN) 500 MG tablet, Take 1 tablet (500 mg total) by mouth at bedtime as needed. (Patient not taking: Reported on 08/13/2015), Disp: 20 tablet, Rfl: 0  Filed Vitals:   08/13/15 1545  BP: 125/77  Pulse: 100  Temp: 98.2 F (36.8 C)  Resp: 16  Height: 5\' 4"  (1.626 m)  Weight: 235 lb (106.595 kg)  SpO2: 98%    Body mass index is 40.32 kg/(m^2).           Review of Systems  Denies chest pain, dyspnea on exertion, PND, orthopnea, hemoptysis, claudication.     Objective:   Physical Exam BP 125/77 mmHg  Pulse 100  Temp(Src) 98.2 F (36.8 C)  Resp 16  Ht 5\' 4"  (1.626 m)  Wt 235 lb (106.595 kg)  BMI 40.32 kg/m2  SpO2 98%  Gen.-alert and oriented x3 in no apparent distress HEENT normal for  Age Lungs no rhonchi or wheezing  Lower extremities 3+ femoral and dorsalis pedis pulses palpable bilaterally with no edema   left leg with large bulging varicosities beginning in the lateral thigh extending lateral to the knee and into the pretibial region. Reticular and spider veins in the posterior lateral aspect near the lateral malleolus were previous bleeding occurred. 1+ edema distally.  Today I ordered a venous duplex exam which I reviewed and interpreted. There is no DVT. There is no gross reflux in either great saphenous vein right or left nor is there reflux in the deep vein system.       Assessment:      painful varicosities left leg and lateral thigh and pretibial region with history of bleeding left lateral ankle treated with sclerotherapy     Plan:        #1 long leg elastic compression stockings 20-30 mm gradient #2 elevate legs as much as possible #3 ibuprofen daily on a regular basis for pain #4 return in 3 months-if no significant improvement then  She should have multiple stab phlebectomy (greater than 20) of painful varicosities left leg with 1 course of sclerotherapy  Return in 3 months

## 2015-11-11 ENCOUNTER — Encounter: Payer: Self-pay | Admitting: Vascular Surgery

## 2015-11-19 ENCOUNTER — Encounter: Payer: Self-pay | Admitting: Vascular Surgery

## 2015-11-19 ENCOUNTER — Ambulatory Visit (INDEPENDENT_AMBULATORY_CARE_PROVIDER_SITE_OTHER): Payer: BLUE CROSS/BLUE SHIELD | Admitting: Vascular Surgery

## 2015-11-19 VITALS — BP 125/80 | HR 57 | Temp 98.0°F | Resp 16 | Ht 64.0 in | Wt 236.0 lb

## 2015-11-19 DIAGNOSIS — I83892 Varicose veins of left lower extremities with other complications: Secondary | ICD-10-CM

## 2015-11-19 NOTE — Progress Notes (Signed)
Subjective:     Patient ID: Jackie Wells, female   DOB: 01/25/1971, 45 y.o.   MRN: [id number]  HPI this 45 year old female returns today for continued follow-up regarding her painful varicosities in the left leg. She continues to have aching throbbing and burning discomfort in the lateral thigh and lateral calf area. She has tried long leg elastic compression stockings 20-30 millimeter gradient as well as elevation and ibuprofen with no improvement. Her symptoms are affecting her daily living and continuing to worsen. She also has had an episode of bleeding in the left ankle area over some prominent reticular veins. She has no history of DVT.  Past Medical History  Diagnosis Date  . Anemia   . Diabetes mellitus without complication [facility])     Social History  Substance Use Topics  . Smoking status: Never Smoker   . Smokeless tobacco: Never Used  . Alcohol Use: No    History reviewed. No pertinent family history.  No Known Allergies   Current outpatient prescriptions:  .  Ferrous Sulfate (IRON) 325 (65 FE) MG TABS, Take 1 tablet by mouth daily., Disp: 90 each, Rfl: 3 .  lisinopril-hydrochlorothiazide (PRINZIDE,ZESTORETIC) 10-12.5 MG per tablet, Take 1 tablet by mouth daily., Disp: 90 tablet, Rfl: 3 .  metFORMIN (GLUCOPHAGE) 1000 MG tablet, Take 1 tablet (1,000 mg total) by mouth 2 (two) times daily with a meal., Disp: 180 tablet, Rfl: 3 .  methocarbamol (ROBAXIN) 500 MG tablet, Take 1 tablet (500 mg total) by mouth at bedtime as needed. (Patient not taking: Reported on 08/13/2015), Disp: 20 tablet, Rfl: 0  Filed Vitals:   11/19/15 1600  BP: 125/80  Pulse: 57  Temp: 98 F (36.7 C)  Resp: 16  Height: 5\' 4"  (1.626 m)  Weight: 236 lb (107.049 kg)  SpO2: 100%    Body mass index is 40.49 kg/(m^2).         Review of Systems denies chest pain, dyspnea on exertion, PND, orthopnea, hemoptysis     Objective:   Physical Exam BP 125/80 mmHg  Pulse 57  Temp(Src) 98  F (36.7 C)  Resp 16  Ht 5\' 4"  (1.626 m)  Wt 236 lb (107.049 kg)  BMI 40.49 kg/m2  SpO2 100%  Well-developed well-nourished female no apparent distress alert and oriented 3 Lungs no rhonchi or wheezing Left leg with bulging varicosities beginning in the anterior thigh extending laterally down lateral to the knee and into the lateral pretibial region. Prominent reticular veins lateral and posterior to lateral malleolus where previous bleeding occurred. 3+ dorsalis pedis pulse palpable.     Assessment:     Painful varicosities left leg and bleeding area left ankle which have been resistant to conservative measures including long-leg elastic compression stockings, elevation, and ibuprofen. Symptoms are affecting patient's daily living    Plan:     Patient needs #1 stab phlebectomy greater than 20 of painful varicosities left leg +1 course of sclerotherapy where bleeding occurred We will proceed with precertification to perform this in the near future

## 2015-12-02 ENCOUNTER — Encounter: Payer: Self-pay | Admitting: Vascular Surgery

## 2015-12-02 ENCOUNTER — Ambulatory Visit (INDEPENDENT_AMBULATORY_CARE_PROVIDER_SITE_OTHER): Payer: BLUE CROSS/BLUE SHIELD | Admitting: Vascular Surgery

## 2015-12-02 VITALS — BP 126/81 | HR 107 | Temp 99.1°F | Resp 16 | Ht 64.0 in | Wt 236.0 lb

## 2015-12-02 DIAGNOSIS — I83892 Varicose veins of left lower extremities with other complications: Secondary | ICD-10-CM

## 2015-12-02 NOTE — Progress Notes (Signed)
    Stab Phlebectomy Procedure  Jackie Wells DOB:Jun 06, 1970  12/02/2015  Consent signed: Yes  Surgeon:J.D. Kellie Simmering  Procedure: stab phlebectomy: left leg  BP 126/81 mmHg  Pulse 107  Temp(Src) 99.1 F (37.3 C)  Resp 16  Ht 5\' 4"  (1.626 m)  Wt 236 lb (107.049 kg)  BMI 40.49 kg/m2  SpO2 96%  Start time: 1:05pm   End time: 2:30pm   Tumescent Anesthesia: 300 cc 0.9% NaCl with 50 cc Lidocaine HCL with 1% Epi and 15 cc 8.4% NaHCO3  Local Anesthesia: 8 cc Lidocaine HCL and NaHCO3 (ratio 2:1)  Sclerotherapy: .3 %Sotradecol. Patient received a total of 6 cc  Stab Phlebectomy: >20 Sites: Thigh and Calf  Patient tolerated procedure well: Yes  Notes:   Description of Procedure:  After marking the course of the secondary varicosities, the patient was placed on the operating table in the supine position, and the left leg was prepped and draped in sterile fashion.    The patient was then put into Trendelenburg position.  Local anesthetic was administered at the previously marked varicosities, and tumescent anesthesia was administered around the vessels.  Greater than 20 stab wounds were made using the tip of an 11 blade. And using the vein hook, the phlebectomies were performed using a hemostat to avulse the varicosities.  Adequate hemostasis was achieved, and steri strips were applied to the stab wound.    Sclerotherapy was performed to several varicosities using 6  cc .3% Sotradecol foam via a 27g butterfly needle.  ABD pads and thigh high compression stockings were applied as well ace wraps where needed. Blood loss was less than 15 cc.  The patient ambulated out of the operating room having tolerated the procedure well.

## 2015-12-02 NOTE — Progress Notes (Signed)
Subjective:     Patient ID: Jackie Wells, female   DOB: 11-22-1970, 45 y.o.   MRN: [id number]  HPI this 45 year old female had multiple stab phlebectomy-greater than 20 of painful varicosities plus foam sclerotherapy left leg performed under local tumescent anesthesia. She tolerated the procedures well.   Review of Systems     Objective:   Physical Exam BP 126/81 mmHg  Pulse 107  Temp(Src) 99.1 F (37.3 C)  Resp 16  Ht 5\' 4"  (1.626 m)  Wt 236 lb (107.049 kg)  BMI 40.49 kg/m2  SpO2 96%       Assessment:     Well-tolerated. Phlebectomy of painful varicosities and foam sclerotherapy left leg performed under local tumescent anesthesia    Plan:     Return in 2-3 months for final follow-up

## 2015-12-04 ENCOUNTER — Encounter: Payer: Self-pay | Admitting: Vascular Surgery

## 2016-01-08 ENCOUNTER — Ambulatory Visit (INDEPENDENT_AMBULATORY_CARE_PROVIDER_SITE_OTHER): Payer: BLUE CROSS/BLUE SHIELD | Admitting: Family Medicine

## 2016-01-08 DIAGNOSIS — IMO0002 Reserved for concepts with insufficient information to code with codable children: Secondary | ICD-10-CM

## 2016-01-08 DIAGNOSIS — E118 Type 2 diabetes mellitus with unspecified complications: Secondary | ICD-10-CM | POA: Diagnosis not present

## 2016-01-08 DIAGNOSIS — E669 Obesity, unspecified: Secondary | ICD-10-CM | POA: Diagnosis not present

## 2016-01-08 DIAGNOSIS — E119 Type 2 diabetes mellitus without complications: Secondary | ICD-10-CM | POA: Diagnosis not present

## 2016-01-08 DIAGNOSIS — I10 Essential (primary) hypertension: Secondary | ICD-10-CM

## 2016-01-08 DIAGNOSIS — E1169 Type 2 diabetes mellitus with other specified complication: Secondary | ICD-10-CM

## 2016-01-08 DIAGNOSIS — E1165 Type 2 diabetes mellitus with hyperglycemia: Secondary | ICD-10-CM

## 2016-01-08 MED ORDER — LISINOPRIL-HYDROCHLOROTHIAZIDE 10-12.5 MG PO TABS
1.0000 | ORAL_TABLET | Freq: Every day | ORAL | 3 refills | Status: DC
Start: 2016-01-08 — End: 2017-01-07

## 2016-01-08 MED ORDER — METFORMIN HCL 1000 MG PO TABS: 1000.0000 mg | ORAL_TABLET | Freq: Two times a day (BID) | ORAL | 3 refills | Status: DC

## 2016-01-08 NOTE — Progress Notes (Signed)
Jackie Wells is a 45 y.o. female who presents to Urgent Care today for shoulder pain.  [employer] interpreter [phone] Tammi Klippel used for visit  1.   Hypertension:  Long-term problem for this patient.  No adverse effects from medication.  Not checking it regularly.  No HA, CP, dizziness, shortness of breath, palpitations, or LE swelling.   BP Readings from Last 3 Encounters:  01/08/16 110/76  12/02/15 126/81  11/19/15 125/80   2.   Diabetes:  Currently on     No adverse effects from medication.  No hypoglycemic events.  No paresthesia or peripheral nerve pain.  Measures blood sugars at home every:     Lab Results  Component Value Date   HGBA1C 8.2 (H) 07/08/2015      ROS as above otherwise neg.  No chest pain, palpitations, SOB, Fever, Chills, Abd pain, N/V/D.   PMH reviewed.  Past Medical History:  Diagnosis Date  . Anemia   . Diabetes mellitus without complication ([place])    No past surgical history on file.  Medications reviewed. Current Outpatient Prescriptions  Medication Sig Dispense Refill  . Ferrous Sulfate (IRON) 325 (65 FE) MG TABS Take 1 tablet by mouth daily. 90 each 3  . lisinopril-hydrochlorothiazide (PRINZIDE,ZESTORETIC) 10-12.5 MG per tablet Take 1 tablet by mouth daily. 90 tablet 3  . metFORMIN (GLUCOPHAGE) 1000 MG tablet Take 1 tablet (1,000 mg total) by mouth 2 (two) times daily with a meal. 180 tablet 3  . methocarbamol (ROBAXIN) 500 MG tablet Take 1 tablet (500 mg total) by mouth at bedtime as needed. (Patient not taking: Reported on 08/13/2015) 20 tablet 0   No current facility-administered medications for this visit.      Physical Exam:  BP 110/76 (BP Location: Right Arm, Patient Position: Sitting, Cuff Size: Large)   Pulse 86   Temp 97.8 F (36.6 C) (Oral)   Resp 16   Ht 5\' 5"  (1.651 m)   Wt 240 lb 9.6 oz (109.1 kg)   LMP 12/23/2015 (Approximate)   SpO2 97%   BMI 40.04 kg/m  Gen:  Alert, cooperative patient who appears stated age in no  acute distress.  Vital signs reviewed. HEENT: EOMI,  MMM Pulm:  Clear to auscultation bilaterally with good air movement.  No wheezes or rales noted.   Cardiac:  Regular rate and rhythm without murmur auscultated.  Good S1/S2. Abd:  Soft/nondistended/nontender.  Good bowel sounds throughout all four quadrants.  No masses noted.  Exts: Non edematous BL  LE, warm and well perfused.   Assessment and Plan:  1.  HTN: - bloodwork from earlier this year was good. - No concerns with BP.  - Refills provided today.  - FU in 6 months  2.  DM2: - Not checking CBGs at home. - she is making dietary and activity changes --> increasing both - FU for A1C in 3 months.  Refill for Metformin today.  If remains elevated, consider increasing/adding glipizidine.

## 2016-01-08 NOTE — Patient Instructions (Addendum)
I have refilled your blood pressure and diabetes medicine.  I'm glad you're doing well.    If you have any shortness of breath, fevers, chest pain, or worsening symptoms, please come back immediately or go to the ER after hours.  It was good to see you today!     IF you received an x-ray today, you will receive an invoice from [facility]. Please contact [facility] at [phone] with questions or concerns regarding your invoice.   IF you received labwork today, you will receive an invoice from [employer]. Please contact [facility] at [phone] with questions or concerns regarding your invoice.   Our billing staff will not be able to assist you with questions regarding bills from these companies.  You will be contacted with the lab results as soon as they are available. The fastest way to get your results is to activate your My Chart account. Instructions are located on the last page of this paperwork. If you have not heard from [place] regarding the results in 2 weeks, please contact this office.

## 2016-02-04 ENCOUNTER — Ambulatory Visit: Payer: Self-pay | Admitting: Vascular Surgery

## 2016-03-06 ENCOUNTER — Encounter: Payer: Self-pay | Admitting: Vascular Surgery

## 2016-03-10 ENCOUNTER — Ambulatory Visit (INDEPENDENT_AMBULATORY_CARE_PROVIDER_SITE_OTHER): Payer: BLUE CROSS/BLUE SHIELD | Admitting: Vascular Surgery

## 2016-03-10 ENCOUNTER — Encounter: Payer: Self-pay | Admitting: Vascular Surgery

## 2016-03-10 VITALS — BP 121/75 | HR 103 | Temp 97.4°F | Resp 16 | Ht 64.0 in | Wt 247.0 lb

## 2016-03-10 DIAGNOSIS — I83892 Varicose veins of left lower extremities with other complications: Secondary | ICD-10-CM | POA: Diagnosis not present

## 2016-03-10 NOTE — Progress Notes (Signed)
Subjective:     Patient ID: Jackie Wells, female   DOB: 04/05/1971, 45 y.o.   MRN: [id number]  HPI This 45 year old female returns for continued follow-up regarding her multiple stab phlebectomy of painful varicosities in the left leg which was performed about 4 months ago. She is very pleased with the result. She has not noticed any distal edema. She is not having discomfort associated with the stab phlebectomy sites. She has no history of DVT or thrombophlebitis. She does have some spider veins in her right lateral thigh she is concerned about. She is no longer wearing elastic compression stockings in the left leg.  Past Medical History:  Diagnosis Date  . Anemia   . Diabetes mellitus without complication [facility])     Social History  Substance Use Topics  . Smoking status: Never Smoker  . Smokeless tobacco: Never Used  . Alcohol use No    History reviewed. No pertinent family history.  No Known Allergies   Current Outpatient Prescriptions:  .  Ferrous Sulfate (IRON) 325 (65 FE) MG TABS, Take 1 tablet by mouth daily., Disp: 90 each, Rfl: 3 .  lisinopril-hydrochlorothiazide (PRINZIDE,ZESTORETIC) 10-12.5 MG tablet, Take 1 tablet by mouth daily., Disp: 90 tablet, Rfl: 3 .  metFORMIN (GLUCOPHAGE) 1000 MG tablet, Take 1 tablet (1,000 mg total) by mouth 2 (two) times daily with a meal., Disp: 180 tablet, Rfl: 3 .  methocarbamol (ROBAXIN) 500 MG tablet, Take 1 tablet (500 mg total) by mouth at bedtime as needed. (Patient not taking: Reported on 08/13/2015), Disp: 20 tablet, Rfl: 0  Vitals:   03/10/16 1541  BP: 121/75  Pulse: (!) 103  Resp: 16  Temp: 97.4 F (36.3 C)  SpO2: 99%  Weight: 247 lb (112 kg)  Height: 5\' 4"  (1.626 m)    Body mass index is 42.4 kg/m.        Review of Systems Denies chest pain, claudication, hemoptysis, dyspnea on exertion.    Objective:   Physical Exam BP 121/75   Pulse (!) 103   Temp 97.4 F (36.3 C)   Resp 16   Ht 5\' 4"  (1.626  m)   Wt 247 lb (112 kg)   SpO2 99%   BMI 42.40 kg/m   Gen. well-developed well-nourished female no apparent distress alert and oriented 3 Lungs no rhonchi or wheezing Left leg with nicely healed stab phlebectomy sites and distal posterior thigh and lateral calf and medial calf. No distal edema noted. 2+ dorsalis pedis pulse palpable. Right distal lateral thigh has a patch of superficial telangiectasias over about a 4 cm area. No distal edema on the right.    Assessment:     Good result following multiple stab phlebectomy of painful varicosities left leg Spider veins right lateral thigh    Plan:     Patient will consider foam sclerotherapy of spider veins right lateral thigh and otherwise return to see [place] on a when necessary basis

## 2016-05-14 ENCOUNTER — Encounter: Payer: Self-pay | Admitting: *Deleted

## 2016-05-20 ENCOUNTER — Ambulatory Visit (INDEPENDENT_AMBULATORY_CARE_PROVIDER_SITE_OTHER): Payer: Self-pay | Admitting: *Deleted

## 2016-05-20 ENCOUNTER — Encounter: Payer: Self-pay | Admitting: Vascular Surgery

## 2016-05-20 DIAGNOSIS — I8393 Asymptomatic varicose veins of bilateral lower extremities: Secondary | ICD-10-CM

## 2016-05-20 NOTE — Progress Notes (Signed)
X=No results found for: HIV1RNAQUANT% Sotradecol administered with a 27g butterfly.  Patient received a total of 6cc.  Treated as much as I could with one syringe. She has many visible reticulars. Focused on the spiders and one retic on the right side of the right leg. Will follow prn.    Compression stockings applied: Yes.

## 2016-09-25 ENCOUNTER — Ambulatory Visit (INDEPENDENT_AMBULATORY_CARE_PROVIDER_SITE_OTHER): Payer: BLUE CROSS/BLUE SHIELD | Admitting: Urgent Care

## 2016-09-25 ENCOUNTER — Encounter: Payer: Self-pay | Admitting: Urgent Care

## 2016-09-25 ENCOUNTER — Telehealth: Payer: Self-pay

## 2016-09-25 VITALS — BP 130/83 | HR 89 | Temp 98.2°F | Resp 17 | Ht 64.0 in | Wt 235.0 lb

## 2016-09-25 DIAGNOSIS — N912 Amenorrhea, unspecified: Secondary | ICD-10-CM | POA: Diagnosis not present

## 2016-09-25 DIAGNOSIS — E782 Mixed hyperlipidemia: Secondary | ICD-10-CM | POA: Diagnosis not present

## 2016-09-25 DIAGNOSIS — E119 Type 2 diabetes mellitus without complications: Secondary | ICD-10-CM

## 2016-09-25 DIAGNOSIS — I10 Essential (primary) hypertension: Secondary | ICD-10-CM | POA: Diagnosis not present

## 2016-09-25 LAB — POCT URINE PREGNANCY: Preg Test, Ur: NEGATIVE

## 2016-09-25 MED ORDER — BLOOD GLUCOSE MONITOR KIT: PACK | 0 refills | Status: DC

## 2016-09-25 MED ORDER — ATORVASTATIN CALCIUM 20 MG PO TABS: 20.0000 mg | ORAL_TABLET | Freq: Every day | ORAL | 3 refills | Status: DC

## 2016-09-25 NOTE — Patient Instructions (Addendum)
La diabetes mellitus y los alimentos (Diabetes Mellitus and Food) Es importante que controle su nivel de azcar en la sangre (glucosa). El nivel de glucosa en sangre depende en gran medida de lo que usted come. [place]er alimentos saludables en las cantidades [place] a lo largo del Training and development officer, aproximadamente a la misma hora [employer], lo ayudar a Chief Technology Officer su nivel de Multimedia programmer. Tambin puede ayudarlo a retrasar o Patent attorney de la diabetes mellitus. Comer de [employer] saludable incluso puede ayudarlo a Chartered loss adjuster de presin arterial y a Science writer o Theatre manager un peso saludable. Entre las recomendaciones generales para alimentarse y Audiological scientist los alimentos de forma saludable, se incluyen las siguientes:  Respetar las comidas principales y comer colaciones con regularidad. Evitar pasar largos perodos sin comer con el fin de perder peso.  Seguir una dieta que consista principalmente en alimentos de origen vegetal, como frutas, vegetales, frutos secos, legumbres y cereales integrales.  Utilizar mtodos de coccin a baja temperatura, como hornear, en lugar de mtodos de coccin a alta temperatura, como frer en abundante aceite. Trabaje con el nutricionista para aprender a Financial planner nutricional de las etiquetas de los alimentos. CMO PUEDEN AFECTARME LOS ALIMENTOS? Carbohidratos Los carbohidratos afectan el nivel de glucosa en sangre ms que cualquier otro tipo de alimento. El nutricionista lo ayudar a Teacher, adult education cuntos carbohidratos puede consumir en cada comida y ensearle a contarlos. El recuento de carbohidratos es importante para mantener la glucosa en sangre en un nivel saludable, en especial si utiliza insulina o toma determinados medicamentos para la diabetes mellitus. Alcohol El alcohol puede provocar disminuciones sbitas de la glucosa en sangre (hipoglucemia), en especial si utiliza insulina o toma determinados medicamentos para la diabetes mellitus. La  hipoglucemia es una afeccin que puede poner en peligro la vida. Los sntomas de la hipoglucemia (somnolencia, mareos y Data processing manager) son similares a los sntomas de haber consumido mucho alcohol. Si el mdico lo autoriza a beber alcohol, hgalo con moderacin y siga estas pautas:  Las mujeres no deben beber ms de un trago por da, y los hombres no deben beber ms de dos tragos por Training and development officer. Un trago es igual a:  12 onzas (355 ml) de cerveza  5 onzas de vino (150 ml) de vino  1,5onzas (23m) de bebidas espirituosas  No beba con el estmago vaco.  Mantngase hidratado. Beba agua, gaseosas dietticas o t helado sin azcar.  Las gaseosas comunes, los jugos y otros refrescos podran contener muchos carbohidratos y se dCivil Service fast streamer QU ALIMENTOS NO SE RECOMIENDAN? Cuando haga las elecciones de alimentos, es importante que recuerde que todos los alimentos son distintos. Algunos tienen menos nutrientes que otros por porcin, aunque podran tener la misma cantidad de caloras o carbohidratos. Es difcil darle al cuerpo lo que necesita cuando consume alimentos con menos nutrientes. Estos son algunos ejemplos de alimentos que debera evitar ya que contienen muchas caloras y carbohidratos, pero pocos nutrientes:  GPhysicist, medicaltrans (la mayora de los alimentos procesados incluyen grasas trans en la etiqueta de Informacin nutricional).  Gaseosas comunes.  Jugos.  Caramelos.  Dulces, como tortas, pasteles, rosquillas y [place]  Comidas fritas. QU ALIMENTOS PUEDO COMER? Consuma alimentos ricos en nutrientes, que nutrirn el cuerpo y lo mantendrn saludable. Los alimentos que debe comer tambin dependern de varios factores, como:  Las caloras que necesita.  Los medicamentos que toma.  Su peso.  El nivel de glucosa en sElizabeth  El n[place]de presin arterial.  El nivel de colesterol. Debe consumir  una amplia variedad de alimentos, por ejemplo:  Protenas.  Cortes de [employer].  Protenas con bajo contenido de grasas saturadas, como pescado, clara de huevo y frijoles. Evite las carnes procesadas.  Frutas y vegetales.  Frutas y Photographer que pueden ayudar a Chief Technology Officer los niveles sanguneos de [place], como [place], mangos y batatas.  Productos lcteos.  Elija productos lcteos sin grasa o con bajo contenido de [place], como [place], yogur y [place].  Cereales, panes, pastas y arroz.  Elija cereales integrales, como panes multicereales, avena en grano y arroz integral. Estos alimentos pueden ayudar a controlar la presin arterial.  Daphene Jaeger.  Alimentos que contengan grasas saludables, como frutos secos, Musician, aceite de [place], aceite de canola y pescado. TODOS LOS QUE PADECEN DIABETES MELLITUS TIENEN EL [place] PLAN DE [place]? Dado que todas las personas que padecen diabetes mellitus son distintas, no hay un solo plan de comidas que funcione para todos. Es muy importante que se rena con un nutricionista que lo ayudar a crear un plan de comidas adecuado para usted. Esta informacin no tiene Marine scientist el consejo del mdico. Asegrese de hacerle al mdico cualquier pregunta que tenga. Document Released: 08/25/2007 Document Revised: 06/08/2014 Document Reviewed: 04/14/2013 Elsevier Interactive Patient Education  2017 [place] primaria (Primary Amenorrhea) La amenorrea primaria es la ausencia de el flujo menstrual en [place] a la edad de 15aos aproximadamente. La edad promedio para el inicio de la menstruacin es 12aos. No se considera amenorrea primaria hasta que la mujer tenga ms de 15aos y no haya menstruado nunca. Puede producirse en presencia o ausencia de otros signos fsicos de pubertad. CAUSAS Algunas causas comunes de la falta de menstruacin son:  Ardelia Mems anormalidad cromosmica que produce el mal funcionamiento de los ovarios es la causa ms comn de amenorrea primaria.  Desnutricin.  Bajo nivel de glucosa en  sangre (hipoglucemia).  Sndrome de ovario poliqustico (quistes en los ovarios, falta de ovulacin).  Ausencia de vagina, tero u ovarios desde el nacimiento (congnita).  Obesidad extrema.  Fibrosis qustica.  Prdida de peso drstica por cualquier causa.  Exceso de ejercicio (correr, Catering manager) que cause prdida de Air traffic controller.  Tumor en la hipfisis.  Enfermedad de larga duracin (crnica).  Enfermedad de [place].  Enfermedad de la tiroides (hipotiroidismo, hipertiroidismo).  Funcionamiento anormal de una parte del cerebro (hipotlamo).  Insuficiencia ovrica prematura. SNTOMAS La falta de menstruacin a los 15aos en mujeres con un desarrollo normal es el sntoma principal. Otros sntomas son:  Facilities manager.  [place].  Acn adulto.  Vello facial o en el pecho.  Dolores de [place].  Visin defectuosa.  Estrs reciente.  Cambios de [place], la dieta o los patrones de Barrister's clerk. DIAGNSTICO La amenorrea primaria se diagnostica con la ayuda de la historia clnica y un examen fsico. Otras pruebas recomendadas incluyen:  Anlisis de sangre para verificar si est embarazada o tiene cambios hormonales, trastornos de la tiroides o de sangrado, bajo nivel de hierro (anemia) u otros problemas.  Anlisis de [place].  Radiografas especializadas. TRATAMIENTO El tratamiento depender de la causa. Por ejemplo, algunas de las causas de la amenorrea primaria, como la [place] congnita de rganos [place], necesitarn ciruga para corregirse. Otras causas pueden responder al tratamiento con medicamentos. SOLICITE ATENCIN MDICA SI:  No hubo flujo menstrual hasta los 15 aos.  La maduracin corporal no ocurre al ritmo habitual de Adult nurse.  Siente dolor en la zona plvica.  Hay un aumento de peso inusual  o crecimiento de vello. Esta informacin no tiene Marine scientist el consejo del mdico. Asegrese de hacerle al mdico cualquier  pregunta que tenga. Document Released: 05/18/2005 Document Revised: 06/08/2014 Document Reviewed: 12/28/2012 Elsevier Interactive Patient Education  2017 [employer].   IF you received an x-ray today, you will receive an invoice from [facility]. Please contact [facility] at [phone] with questions or concerns regarding your invoice.   IF you received labwork today, you will receive an invoice from [place]. Please contact [facility] at [phone] with questions or concerns regarding your invoice.   Our billing staff will not be able to assist you with questions regarding bills from these companies.  You will be contacted with the lab results as soon as they are available. The fastest way to get your results is to activate your My Chart account. Instructions are located on the last page of this paperwork. If you have not heard from [place] regarding the results in 2 weeks, please contact this office.

## 2016-09-25 NOTE — Progress Notes (Signed)
MONITOR, STRIPS AND LANCET RX SENT TO [facility]

## 2016-09-25 NOTE — Telephone Encounter (Signed)
Faxed order for blood glucose meter kit and supplies to pharmacy.

## 2016-09-25 NOTE — Progress Notes (Signed)
   MRN: [id number]  Subjective:   Jackie Wells is a 46 y.o. female who presents for follow up of Type 2 Diabetes Mellitus on HTN.  DM - Diagnosis was made 2008. Patient is currently managed with metformin only. Denies adverse effects including metallic taste, hypoglycemia. Patient is not checking home blood sugars. Needs blood glucose kit. Admits polyuria but also hydrates very well. Patient denies blurred vision, polydipsia, chest pain, nausea, vomiting, abdominal pain, hematuria, skin infections, numbness or tingling. Patient is not checking their feet daily. Denies foot concerns. Last diabetic eye exam eye exam was 2017. Diet is non-compliant but she is going to try to make an effort for dietary changes. Patient is not exercising.   HTN - patient takes lis-HCTZ. Does not avoid salt in her diet.   Amenorrhea - Reports that she has not had a menstrual cycle in nearly a year. Feels like she is having hot flashes. Denies depressed mood, vaginal dryness or irritation.   Objective:   PHYSICAL EXAM BP 130/83 (BP Location: Right Arm, Patient Position: Sitting, Cuff Size: Large)   Pulse 89   Temp 98.2 F (36.8 C) (Oral)   Resp 17   Ht '5\' 4"'$  (1.626 m)   Wt 235 lb (106.6 kg)   LMP 04/15/2016   SpO2 96%   BMI 40.34 kg/m   Physical Exam  Constitutional: She is oriented to person, place, and time. She appears well-developed and well-nourished.  Neck: Normal range of motion. Neck supple.  Cardiovascular: Normal rate, regular rhythm and intact distal pulses.  Exam reveals no gallop and no friction rub.   No murmur heard. Pulmonary/Chest: No respiratory distress. She has no wheezes. She has no rales.  Abdominal: Soft. Bowel sounds are normal. She exhibits no distension and no mass. There is no tenderness. There is no guarding.  Neurological: She is alert and oriented to person, place, and time.  Skin: Skin is warm and dry.  Psychiatric: She has a normal mood and affect.    Diabetic Foot Exam - Simple   Simple Foot Form Visual Inspection No deformities, no ulcerations, no other skin breakdown bilaterally:  Yes Sensation Testing Intact to touch and monofilament testing bilaterally:  Yes Pulse Check Comments    Assessment and Plan :   1. Type 2 diabetes mellitus without complication, without long-term current use of insulin ([place]) 2. Morbid obesity ([place]) - Labs pending, will make medication changes as necessary. Emphasized practicing diabetic friendly diet. Recheck in 3-6 months depending on labs and medication changes. - Hemoglobin A1c - Comprehensive metabolic panel - Lipid panel - Microalbumin/Creatinine Ratio, Urine - atorvastatin (LIPITOR) 20 MG tablet; Take 1 tablet (20 mg total) by mouth daily.  Dispense: 90 tablet; Refill: 3  3. Essential hypertension - Maintain current regimen. Counseled on dietary compliance and exercise.  - Comprehensive metabolic panel - Microalbumin/Creatinine Ratio, Urine  4. Mixed hyperlipidemia - Start Lipitor, labs pending - atorvastatin (LIPITOR) 20 MG tablet; Take 1 tablet (20 mg total) by mouth daily.  Dispense: 90 tablet; Refill: 3  5. Amenorrhea - Labs pending - TSH - Prolactin - POCT urine pregnancy - FSH/LH   Jaynee Eagles, PA-C [facility] [phone] 09/25/2016 8:38 AM

## 2016-09-25 NOTE — Addendum Note (Signed)
Addended by: Virgia Land on: 09/25/2016 11:15 AM   Modules accepted: Orders

## 2016-09-26 LAB — COMPREHENSIVE METABOLIC PANEL
ALBUMIN: 4.5 g/dL (ref 3.5–5.5)
ALK PHOS: 98 IU/L (ref 39–117)
ALT: 66 IU/L — ABNORMAL HIGH (ref 0–32)
AST: 42 IU/L — ABNORMAL HIGH (ref 0–40)
Albumin/Globulin Ratio: 1.6 (ref 1.2–2.2)
BILIRUBIN TOTAL: 0.3 mg/dL (ref 0.0–1.2)
BUN / CREAT RATIO: 23 (ref 9–23)
BUN: 14 mg/dL (ref 6–24)
CHLORIDE: 97 mmol/L (ref 96–106)
CO2: 23 mmol/L (ref 18–29)
CREATININE: 0.62 mg/dL (ref 0.57–1.00)
Calcium: 9.5 mg/dL (ref 8.7–10.2)
GFR calc non Af Amer: 109 mL/min/{1.73_m2} (ref >59)
GFR, EST AFRICAN AMERICAN: 126 mL/min/{1.73_m2} (ref >59)
GLOBULIN, TOTAL: 2.8 g/dL (ref 1.5–4.5)
GLUCOSE: 259 mg/dL — AB (ref 65–99)
Potassium: 4.7 mmol/L (ref 3.5–5.2)
SODIUM: 137 mmol/L (ref 134–144)
TOTAL PROTEIN: 7.3 g/dL (ref 6.0–8.5)

## 2016-09-26 LAB — LIPID PANEL
CHOLESTEROL TOTAL: 190 mg/dL (ref 100–199)
Chol/HDL Ratio: 4.4 ratio (ref 0.0–4.4)
HDL: 43 mg/dL (ref >39)
LDL CALC: 111 mg/dL — AB (ref 0–99)
Triglycerides: 181 mg/dL — ABNORMAL HIGH (ref 0–149)
VLDL CHOLESTEROL CAL: 36 mg/dL (ref 5–40)

## 2016-09-26 LAB — FSH/LH
FSH: 30.3 m[IU]/mL
LH: 33.2 m[IU]/mL

## 2016-09-26 LAB — PROLACTIN: Prolactin: 8.9 ng/mL (ref 4.8–23.3)

## 2016-09-26 LAB — MICROALBUMIN / CREATININE URINE RATIO
CREATININE, UR: 73.9 mg/dL
MICROALB/CREAT RATIO: 10.7 mg/g{creat} (ref 0.0–30.0)
Microalbumin, Urine: 7.9 ug/mL

## 2016-09-26 LAB — TSH: TSH: 4.86 u[IU]/mL — AB (ref 0.450–4.500)

## 2016-09-26 LAB — HEMOGLOBIN A1C
Est. average glucose Bld gHb Est-mCnc: 301 mg/dL
Hgb A1c MFr Bld: 12.1 % — ABNORMAL HIGH (ref 4.8–5.6)

## 2016-09-30 ENCOUNTER — Telehealth: Payer: Self-pay | Admitting: Family Medicine

## 2016-09-30 NOTE — Telephone Encounter (Signed)
PT CALLING MANI BACK STATING THAT ITS A GOOD TIME TO CALL HER NOW SINCE SHES OFF FROM WORK AND ALSO JUST LEAVE A MESSAGE ON ANSWERING MACHINE OR RESULTS

## 2016-10-01 NOTE — Telephone Encounter (Signed)
Refer to result note from 10/01/2016.

## 2016-10-01 NOTE — Telephone Encounter (Signed)
See abnormal results and advise.

## 2016-10-03 ENCOUNTER — Encounter: Payer: Self-pay | Admitting: Urgent Care

## 2016-10-03 ENCOUNTER — Ambulatory Visit (INDEPENDENT_AMBULATORY_CARE_PROVIDER_SITE_OTHER): Payer: BLUE CROSS/BLUE SHIELD | Admitting: Urgent Care

## 2016-10-03 VITALS — BP 115/77 | HR 84 | Temp 98.5°F | Resp 16 | Ht 64.5 in | Wt 227.8 lb

## 2016-10-03 DIAGNOSIS — E119 Type 2 diabetes mellitus without complications: Secondary | ICD-10-CM | POA: Diagnosis not present

## 2016-10-03 DIAGNOSIS — Z794 Long term (current) use of insulin: Secondary | ICD-10-CM | POA: Diagnosis not present

## 2016-10-03 MED ORDER — SITAGLIPTIN PHOS-METFORMIN HCL 50-1000 MG PO TABS: 1.0000 | ORAL_TABLET | Freq: Two times a day (BID) | ORAL | 1 refills | Status: DC

## 2016-10-03 MED ORDER — PEN NEEDLES 32G X 4 MM MISC: 1.0000 | Freq: Every day | 1 refills | Status: DC

## 2016-10-03 MED ORDER — INSULIN GLARGINE 100 UNITS/ML SOLOSTAR PEN: 15.0000 [IU] | PEN_INJECTOR | Freq: Every day | SUBCUTANEOUS | 5 refills | Status: DC

## 2016-10-03 NOTE — Addendum Note (Signed)
Addended by: Virgia Land on: 10/03/2016 10:36 AM   Modules accepted: Orders

## 2016-10-03 NOTE — Patient Instructions (Addendum)
La diabetes mellitus y los alimentos (Diabetes Mellitus and Food) Es importante que controle su nivel de azcar en la sangre (glucosa). El nivel de glucosa en sangre depende en gran medida de lo que usted come. [place] alimentos saludables en las cantidades [place] a lo largo del Training and development officer, aproximadamente a la misma hora [employer], lo ayudar a Chief Technology Officer su nivel de Multimedia programmer. Tambin puede ayudarlo a retrasar o Patent attorney de la diabetes mellitus. Comer de [employer] saludable incluso puede ayudarlo a Chartered loss adjuster de presin arterial y a Science writer o Theatre manager un peso saludable. Entre las recomendaciones generales para alimentarse y Audiological scientist los alimentos de forma saludable, se incluyen las siguientes:  Respetar las comidas principales y comer colaciones con regularidad. Evitar pasar largos perodos sin comer con el fin de perder peso.  Seguir una dieta que consista principalmente en alimentos de origen vegetal, como frutas, vegetales, frutos secos, legumbres y cereales integrales.  Utilizar mtodos de coccin a baja temperatura, como hornear, en lugar de mtodos de coccin a alta temperatura, como frer en abundante aceite. Trabaje con el nutricionista para aprender a Financial planner nutricional de las etiquetas de los alimentos. CMO PUEDEN AFECTARME LOS ALIMENTOS? Carbohidratos Los carbohidratos afectan el nivel de glucosa en sangre ms que cualquier otro tipo de alimento. El nutricionista lo ayudar a Teacher, adult education cuntos carbohidratos puede consumir en cada comida y ensearle a contarlos. El recuento de carbohidratos es importante para mantener la glucosa en sangre en un nivel saludable, en especial si utiliza insulina o toma determinados medicamentos para la diabetes mellitus. Alcohol El alcohol puede provocar disminuciones sbitas de la glucosa en sangre (hipoglucemia), en especial si utiliza insulina o toma determinados medicamentos para la diabetes mellitus. La  hipoglucemia es una afeccin que puede poner en peligro la vida. Los sntomas de la hipoglucemia (somnolencia, mareos y Data processing manager) son similares a los sntomas de haber consumido mucho alcohol. Si el mdico lo autoriza a beber alcohol, hgalo con moderacin y siga estas pautas:  Las mujeres no deben beber ms de un trago por da, y los hombres no deben beber ms de dos tragos por Training and development officer. Un trago es igual a:  12 onzas (355 ml) de cerveza  5 onzas de vino (150 ml) de vino  1,5onzas (23m) de bebidas espirituosas  No beba con el estmago vaco.  Mantngase hidratado. Beba agua, gaseosas dietticas o t helado sin azcar.  Las gaseosas comunes, los jugos y otros refrescos podran contener muchos carbohidratos y se dCivil Service fast streamer QU ALIMENTOS NO SE RECOMIENDAN? Cuando haga las elecciones de alimentos, es importante que recuerde que todos los alimentos son distintos. Algunos tienen menos nutrientes que otros por porcin, aunque podran tener la misma cantidad de caloras o carbohidratos. Es difcil darle al cuerpo lo que necesita cuando consume alimentos con menos nutrientes. Estos son algunos ejemplos de alimentos que debera evitar ya que contienen muchas caloras y carbohidratos, pero pocos nutrientes:  GPhysicist, medicaltrans (la mayora de los alimentos procesados incluyen grasas trans en la etiqueta de Informacin nutricional).  Gaseosas comunes.  Jugos.  Caramelos.  Dulces, como tortas, pasteles, rosquillas y [place]  Comidas fritas. QU ALIMENTOS PUEDO COMER? Consuma alimentos ricos en nutrientes, que nutrirn el cuerpo y lo mantendrn saludable. Los alimentos que debe comer tambin dependern de varios factores, como:  Las caloras que necesita.  Los medicamentos que toma.  Su peso.  El nivel de glucosa en sElizabeth  El n[place] presin arterial.  El nivel de colesterol. Debe consumir  una amplia variedad de alimentos, por ejemplo:  Protenas. ? Cortes de carne  magros. ? Protenas con bajo contenido de grasas saturadas, como pescado, clara de huevo y frijoles. Evite las carnes procesadas.  Frutas y vegetales. ? Frutas y vegetales que pueden ayudar a controlar los niveles sanguneos de glucosa, como manzanas, mangos y batatas.  Productos lcteos. ? Elija productos lcteos sin grasa o con bajo contenido de grasa, como leche, yogur y queso.  Cereales, panes, pastas y arroz. ? Elija cereales integrales, como panes multicereales, avena en grano y arroz integral. Estos alimentos pueden ayudar a controlar la presin arterial.  Grasas. ? Alimentos que contengan grasas saludables, como frutos secos, aguacate, aceite de oliva, aceite de canola y pescado. TODOS LOS QUE PADECEN DIABETES MELLITUS TIENEN EL MISMO PLAN DE COMIDAS? Dado que todas las personas que padecen diabetes mellitus son distintas, no hay un solo plan de comidas que funcione para todos. Es muy importante que se rena con un nutricionista que lo ayudar a crear un plan de comidas adecuado para usted. Esta informacin no tiene como fin reemplazar el consejo del mdico. Asegrese de hacerle al mdico cualquier pregunta que tenga. Document Released: 08/25/2007 Document Revised: 06/08/2014 Document Reviewed: 04/14/2013 Elsevier Interactive Patient Education  2017 Elsevier Inc.     IF you received an x-ray today, you will receive an invoice from [facility]. Please contact [facility] at [phone] with questions or concerns regarding your invoice.   IF you received labwork today, you will receive an invoice from [facility]. Please contact [facility] at [phone] with questions or concerns regarding your invoice.   Our billing staff will not be able to assist you with questions regarding bills from these companies.  You will be contacted with the lab results as soon as they are available. The fastest way to get your results is to activate your My Chart account. Instructions  are located on the last page of this paperwork. If you have not heard from us regarding the results in 2 weeks, please contact this office.     

## 2016-10-03 NOTE — Progress Notes (Signed)
    MRN: [id number] DOB: 02/26/1971  Subjective:   Jackie Wells is a 46 y.o. female presenting for follow up on uncontrolled type 2 diabetes. Patient has been non-compliant with her diet. She admits that she is going to try much harder and would not like to start insulin but is willing. Denies blurred vision, polydipsia, chest pain, nausea, vomiting, abdominal pain, hematuria, polyuria, skin infections, numbness or tingling. She is taking atorvastatin for HL. She is also on lis-HCTZ for HTN and renal protection. She is a non-smoker.   Jackie Wells has a current medication list which includes the following prescription(s): atorvastatin, blood glucose meter kit and supplies, iron, lisinopril-hydrochlorothiazide, and metformin. Also has No Known Allergies. Jackie Wells  has a past medical history of Anemia and Diabetes mellitus without complication ([place]). Also denies past surgical history.  Objective:   Vitals: BP 115/77   Pulse 84   Temp 98.5 F (36.9 C) (Oral)   Resp 16   Ht 5' 4.5" (1.638 m)   Wt 227 lb 12.8 oz (103.3 kg)   SpO2 95%   BMI 38.50 kg/m   Physical Exam  Constitutional: She is oriented to person, place, and time. She appears well-developed and well-nourished.  HENT:  Mouth/Throat: Oropharynx is clear and moist.  Cardiovascular: Normal rate, regular rhythm and intact distal pulses.  Exam reveals no gallop and no friction rub.   No murmur heard. Pulmonary/Chest: No respiratory distress. She has no wheezes. She has no rales.  Neurological: She is alert and oriented to person, place, and time.  Skin: Skin is warm and dry.  Psychiatric: She has a normal mood and affect.   Assessment and Plan :   1. Type 2 diabetes mellitus without complication, with long-term current use of insulin ([place]) 2. Morbid obesity ([place]) - Start Lantus, Metformin-Sitagliptin. Recommended dietary modifications, exercise and weight loss. Recheck in 4 weeks. - Ambulatory referral to diabetic  education pending   Jackie Eagles, PA-C [facility] [phone] 10/03/2016 9:34 AM

## 2016-10-05 ENCOUNTER — Telehealth: Payer: Self-pay | Admitting: Urgent Care

## 2016-10-05 NOTE — Telephone Encounter (Signed)
Pt advised.

## 2016-10-05 NOTE — Telephone Encounter (Signed)
I dont see a note stating stop metformin specifically, please advise

## 2016-10-05 NOTE — Telephone Encounter (Signed)
PATIENT STATES SHE SAW MARIO MANI ON Saturday FOR HER DIABETES. HE GAVE HER A NEW MEDICATION (JANUMET) 50-1000 MG. SHE DOES NOT KNOW IF SHE IS TO STOP TAKING THE METFORMIN 1000 MG OR IF SHE TAKES THEM BOTH TOGETHER? BEST PHONE [phone] (CELL) [place]

## 2016-10-05 NOTE — Telephone Encounter (Signed)
She is to stop taking the metformin by itself. I need her to start taking the combination of metformin-sitagliptin. Thank you!

## 2016-10-29 ENCOUNTER — Encounter: Payer: Self-pay | Admitting: Dietician

## 2016-10-29 ENCOUNTER — Encounter: Payer: BLUE CROSS/BLUE SHIELD | Attending: Urgent Care | Admitting: Dietician

## 2016-10-29 DIAGNOSIS — E118 Type 2 diabetes mellitus with unspecified complications: Secondary | ICD-10-CM

## 2016-10-29 DIAGNOSIS — Z794 Long term (current) use of insulin: Secondary | ICD-10-CM | POA: Diagnosis not present

## 2016-10-29 DIAGNOSIS — Z713 Dietary counseling and surveillance: Secondary | ICD-10-CM | POA: Insufficient documentation

## 2016-10-29 DIAGNOSIS — IMO0002 Reserved for concepts with insufficient information to code with codable children: Secondary | ICD-10-CM

## 2016-10-29 DIAGNOSIS — E119 Type 2 diabetes mellitus without complications: Secondary | ICD-10-CM | POA: Diagnosis not present

## 2016-10-29 DIAGNOSIS — E1165 Type 2 diabetes mellitus with hyperglycemia: Secondary | ICD-10-CM

## 2016-10-29 NOTE — Progress Notes (Signed)
Diabetes Self-Management Education  Visit Type: First/Initial  Appt. Start Time: 1500 Appt. End Time: 1600  10/29/2016  Jackie Wells, identified by name and date of birth, is a 46 y.o. female with a diagnosis of Diabetes: Type 2.   She checks her blood sugar 1x/day.  According to her log book her fasting blood sugar ranged from 102 - 150 mg/dL over the last few weeks.  Before she started basal insulin her numbers were closer to the 300 range.  She has made many changes to her food and drink choices already and has good nutrition questions for me today.  ASSESSMENT      Diabetes Self-Management Education - 10/29/16 1605      Visit Information   Visit Type First/Initial     Initial Visit   Diabetes Type Type 2   Are you currently following a meal plan? Yes   What type of meal plan do you follow? low sugar   Are you taking your medications as prescribed? Yes   Date Diagnosed 2015     Health Coping   How would you rate your overall health? Fair     Psychosocial Assessment   Patient Belief/Attitude about Diabetes Motivated to manage diabetes   Self-care barriers None   Self-management support Doctor's office;Family   Other persons present Patient;Interpreter;Family Member   Patient Concerns Nutrition/Meal planning;Glycemic Control   Special Needs Other (comment)  Spanish materials   Preferred Learning Style No preference indicated   Learning Readiness Change in progress     Complications   Last HgB A1C per patient/outside source 12.1 %   How often do you check your blood sugar? 1-2 times/day   Fasting Blood glucose range (mg/dL) 130-179   Have you had a dilated eye exam in the past 12 months? Yes   Have you had a dental exam in the past 12 months? No   Are you checking your feet? No     Dietary Intake   Breakfast 10 am - piece of fruit, lemon and cucumber water   Snack (morning) none   Lunch salad with chipotle ranch   Snack (afternoon) sometimes sugar  free cookies or sugar free ice cream   Dinner meat, poultry or fish with 1-2 tortillias, veggies like cucumbers, tomatoes   Snack (evening) same as afternoon or none   Beverage(s) water, no longer drinking sodas or juice     Exercise   Exercise Type ADL's   How many days per week to you exercise? 0   How many minutes per day do you exercise? 0   Total minutes per week of exercise 0     Patient Education   Previous Diabetes Education No   Nutrition management  Role of diet in the treatment of diabetes and the relationship between the three main macronutrients and blood glucose level;Food label reading, portion sizes and measuring food.;Carbohydrate counting;Reviewed blood glucose goals for pre and post meals and how to evaluate the patients' food intake on their blood glucose level.   Physical activity and exercise  Role of exercise on diabetes management, blood pressure control and cardiac health.     Individualized Goals (developed by patient)   Nutrition Follow meal plan discussed;General guidelines for healthy choices and portions discussed   Physical Activity Exercise 3-5 times per week   Medications take my medication as prescribed   Reducing Risk examine blood glucose patterns     Outcomes   Expected Outcomes Demonstrated interest in learning. Expect positive  outcomes   Future DMSE PRN   Program Status Completed      Individualized Plan for Diabetes Self-Management Training:   Learning Objective:  Patient will have a greater understanding of diabetes self-management. Patient education plan is to attend individual and/or group sessions per assessed needs and concerns.   Plan:    Follow Diabetes Meal Plan as instructed  Eat 3 meals and 2 snacks, every 3-5 hrs  Limit carbohydrate intake to 30-45 grams carbohydrate/meal  Limit carbohydrate intake to 15-20 grams carbohydrate/snack  Add lean protein foods to meals/snacks  Monitor glucose levels as instructed by your  doctor  Aim for 30 mins of physical activity daily   Expected Outcomes:  Demonstrated interest in learning. Expect positive outcomes  Education material provided: Living Well with Diabetes, Meal plan card and My Plate  If problems or questions, patient to contact team via:  Phone and Email  Future DSME appointment: PRN

## 2016-11-05 ENCOUNTER — Ambulatory Visit (INDEPENDENT_AMBULATORY_CARE_PROVIDER_SITE_OTHER): Payer: BLUE CROSS/BLUE SHIELD | Admitting: Urgent Care

## 2016-11-05 ENCOUNTER — Encounter: Payer: Self-pay | Admitting: Urgent Care

## 2016-11-05 VITALS — BP 110/76 | HR 89 | Temp 97.8°F | Resp 18 | Ht 64.5 in | Wt 225.4 lb

## 2016-11-05 DIAGNOSIS — Z23 Encounter for immunization: Secondary | ICD-10-CM | POA: Diagnosis not present

## 2016-11-05 DIAGNOSIS — Z6838 Body mass index (BMI) 38.0-38.9, adult: Secondary | ICD-10-CM

## 2016-11-05 DIAGNOSIS — R945 Abnormal results of liver function studies: Secondary | ICD-10-CM

## 2016-11-05 DIAGNOSIS — E119 Type 2 diabetes mellitus without complications: Secondary | ICD-10-CM | POA: Diagnosis not present

## 2016-11-05 DIAGNOSIS — Z794 Long term (current) use of insulin: Secondary | ICD-10-CM

## 2016-11-05 DIAGNOSIS — E669 Obesity, unspecified: Secondary | ICD-10-CM

## 2016-11-05 DIAGNOSIS — R7989 Other specified abnormal findings of blood chemistry: Secondary | ICD-10-CM

## 2016-11-05 NOTE — Progress Notes (Signed)
  MRN: [id number] DOB: December 26, 1970  Subjective:   Jackie Wells is a 46 y.o. female presenting for chief complaint of Diabetes (here for 4 week follow up); Follow-up; and Immunizations (dicussed with patient regarding Tdap)  Last OV was 10/03/2016. Patient was started on insulin, sitagliptin-metformin for aggressive diabetes management given a1c of 12%. Today, patient reports feeling much better. She has made significant dietary changes, has seen a nutritionist. She is eating mostly vegetarian diet. Fasting blood sugar has improved over the past month to 110's-150's in the last 2 weeks. Patient hydrates very well. Reports constipation however. She used to have very regular bowel movements but as of late she does not have a bowel movement every day and feels bloating. Denies blurred vision, polydipsia, chest pain, shob, n/v, abdominal pain, skin infections, numbness or tingling. Denies smoking cigarettes.  Jackie Wells has a current medication list which includes the following prescription(s): atorvastatin, blood glucose meter kit and supplies, iron, insulin glargine, pen needles, lisinopril-hydrochlorothiazide, and sitagliptin-metformin. Also has No Known Allergies.  Jackie Wells  has a past medical history of Anemia; Diabetes mellitus without complication ([place]); Hyperlipidemia; and Hypertension. Also  has no past surgical history on file.  Objective:   Vitals: BP 110/76   Pulse 89   Temp 97.8 F (36.6 C) (Oral)   Resp 18   Ht 5' 4.5" (1.638 m)   Wt 225 lb 6.4 oz (102.2 kg)   SpO2 96%   BMI 38.09 kg/m   Wt Readings from Last 3 Encounters:  11/05/16 225 lb 6.4 oz (102.2 kg)  10/03/16 227 lb 12.8 oz (103.3 kg)  09/25/16 235 lb (106.6 kg)   Physical Exam  Constitutional: She is oriented to person, place, and time. She appears well-developed and well-nourished.  HENT:  Mouth/Throat: Oropharynx is clear and moist.  Eyes: No scleral icterus.  Cardiovascular: Normal rate, regular  rhythm and intact distal pulses.  Exam reveals no gallop and no friction rub.   No murmur heard. Pulmonary/Chest: No respiratory distress. She has no wheezes. She has no rales.  Abdominal: Soft. Bowel sounds are normal. She exhibits no distension and no mass. There is no tenderness. There is no guarding.  Neurological: She is alert and oriented to person, place, and time.  Skin: Skin is warm and dry. Capillary refill takes less than 2 seconds.  Psychiatric: She has a normal mood and affect.   Assessment and Plan :   1. Type 2 diabetes mellitus without complication, with long-term current use of insulin ([place]) - Doing very well, encouraged patient to continue efforts with her diet. She will try to use docusate to help with constipation. Labs pending. Return-to-clinic precautions discussed, patient verbalized understanding. Otherwise, follow up in 3 months.  2. Class 2 obesity without serious comorbidity with body mass index (BMI) of 38.0 to 38.9 in adult, unspecified obesity type - Weight loss of 10 lbs since late April 2017.  3. Elevated LFTs - Comprehensive metabolic panel pending.  4. Need for Tdap vaccination 5. Need for prophylactic vaccination against Streptococcus pneumoniae (pneumococcus) - Updated today  Jackie Eagles, PA-C [facility] [phone] 11/05/2016  8:25 AM

## 2016-11-05 NOTE — Patient Instructions (Addendum)
Salads - kale, spinach, cabbage, spring mix; use seeds like pumpkin seeds or sunflower seeds; you can also use 1-2 hard boiled eggs. Fruits - avocadoes, berries (blueberries, raspberries, blackberries), apples, oranges, pomegranate, grapefruit Seeds - quinoa, chia seeds; you can also incorporate oatmeal Vegetables - aspargus, cauliflower, broccoli, green beans, brussel spouts, bell peppers; stay away from starchy vegetables like potatoes, carrots, peas  Brussel sprouts - Cut off stems. Place in a mixing bowl that has a lid. Pour in a 1/4-1/2 cup olive oil, spices, use a light amount of parmesan. Place on a baking sheet. Bake for 10 minutes at 400F. Take it out, eat the brussel chips. Place for another 5-10 minutes.   Mashed cauliflower - Boil a bunch of cauliflower in a pot of water. Blend in a food processor with 1-2 tablespoons of butter.  Spaghetti squash -  Cut the squash in half very carefully, clean out seeds from the middle. Place 1/2 face down in a microwave safe dish with at least 2 inches of water. Make 4-6 slits on outside of spaghetti squash and microwave for 10-12 minutes. Take out the spaghetti using a metal spoon. Repeat for the other half.   Vega protein is good protein powder, make sure you use ~6 ice cubes to give it smoothie consistency together with ~4-6 ounces of vanilla soy milk. Throw cinnamon into your shake, use peanut butter. You can also use the fruits that I listed above. Throw spinach or kale into the shake.    [place] Ms. Dash para sasona.   Docusate 50mg  [place] 1 pastilla dos veces diaramente. [place] 1 pastilla diaramente. Mike Craze - [place] 1 pastilla cada otro dia.    IF you received an x-ray today, you will receive an invoice from [facility]. Please contact [facility] at [phone] with questions or concerns regarding your invoice.   IF you received labwork today, you will receive an invoice from [place].  Please contact [facility] at [phone] with questions or concerns regarding your invoice.   Our billing staff will not be able to assist you with questions regarding bills from these companies.  You will be contacted with the lab results as soon as they are available. The fastest way to get your results is to activate your My Chart account. Instructions are located on the last page of this paperwork. If you have not heard from [place] regarding the results in 2 weeks, please contact this office.

## 2016-11-06 LAB — COMPREHENSIVE METABOLIC PANEL
ALBUMIN: 4.6 g/dL (ref 3.5–5.5)
ALK PHOS: 68 IU/L (ref 39–117)
ALT: 26 IU/L (ref 0–32)
AST: 23 IU/L (ref 0–40)
Albumin/Globulin Ratio: 1.6 (ref 1.2–2.2)
BILIRUBIN TOTAL: 0.3 mg/dL (ref 0.0–1.2)
BUN / CREAT RATIO: 24 — AB (ref 9–23)
BUN: 16 mg/dL (ref 6–24)
CHLORIDE: 101 mmol/L (ref 96–106)
CO2: 23 mmol/L (ref 18–29)
Calcium: 9.9 mg/dL (ref 8.7–10.2)
Creatinine, Ser: 0.68 mg/dL (ref 0.57–1.00)
GFR calc Af Amer: 121 mL/min/{1.73_m2} (ref >59)
GFR calc non Af Amer: 105 mL/min/{1.73_m2} (ref >59)
GLOBULIN, TOTAL: 2.9 g/dL (ref 1.5–4.5)
Glucose: 132 mg/dL — ABNORMAL HIGH (ref 65–99)
POTASSIUM: 4.4 mmol/L (ref 3.5–5.2)
SODIUM: 140 mmol/L (ref 134–144)
Total Protein: 7.5 g/dL (ref 6.0–8.5)

## 2016-11-12 ENCOUNTER — Telehealth: Payer: Self-pay | Admitting: Radiology

## 2016-11-12 NOTE — Telephone Encounter (Signed)
The patient return my call, I informed of results and advised to return to office for follow up on A1C in 2 months.

## 2016-12-23 ENCOUNTER — Other Ambulatory Visit: Payer: Self-pay | Admitting: Urgent Care

## 2017-01-05 ENCOUNTER — Other Ambulatory Visit: Payer: Self-pay | Admitting: Family Medicine

## 2017-01-05 DIAGNOSIS — I10 Essential (primary) hypertension: Secondary | ICD-10-CM

## 2017-01-06 ENCOUNTER — Telehealth: Payer: Self-pay | Admitting: Urgent Care

## 2017-01-06 NOTE — Telephone Encounter (Signed)
PATIENT CALLED BACK REGARDING SARA'S PREVIOUS MESSAGE. SHE NEEDS MARIO TO REFILL HER LISINOPRIL-HYDROCHLOROTHIAZIDE 10-12.5 MG. NOTE: SHE WOULD LIKE ALL OF HER MEDICATIONS TO BE CALLED INTO [facility] ON [address] INSTEAD OF [facility] ON [address] BECAUSE THEY ARE CHEAPER. WE CAN TAKE [facility] OFF OF HER CHART. BEST PHONE [phone] (CELL) [place]

## 2017-01-06 NOTE — Telephone Encounter (Signed)
Pt left vm on referrals phone but message kept cutting in and out. I believe the pt was wanting a refill for medication but it was unclear what medication. I called the pt and left her a message to give our office a callback for clarification and we could put a telephone message in for her. Thanks.

## 2017-01-07 ENCOUNTER — Telehealth: Payer: Self-pay

## 2017-01-07 ENCOUNTER — Other Ambulatory Visit: Payer: Self-pay

## 2017-01-07 DIAGNOSIS — I10 Essential (primary) hypertension: Secondary | ICD-10-CM

## 2017-01-07 MED ORDER — LISINOPRIL-HYDROCHLOROTHIAZIDE 10-12.5 MG PO TABS: 1.0000 | ORAL_TABLET | Freq: Every day | ORAL | 0 refills | Status: DC

## 2017-01-07 NOTE — Telephone Encounter (Signed)
Lisinopril-hydrochlorothiazide refilled till pt comes in for visit with [facility] on 02/11/2017.

## 2017-02-08 ENCOUNTER — Encounter: Payer: Self-pay | Admitting: Family Medicine

## 2017-02-08 ENCOUNTER — Ambulatory Visit (INDEPENDENT_AMBULATORY_CARE_PROVIDER_SITE_OTHER): Payer: BLUE CROSS/BLUE SHIELD | Admitting: Family Medicine

## 2017-02-08 VITALS — BP 110/76 | HR 60 | Temp 97.8°F | Resp 18 | Ht 64.96 in | Wt 225.4 lb

## 2017-02-08 DIAGNOSIS — I10 Essential (primary) hypertension: Secondary | ICD-10-CM | POA: Diagnosis not present

## 2017-02-08 DIAGNOSIS — M549 Dorsalgia, unspecified: Secondary | ICD-10-CM | POA: Diagnosis not present

## 2017-02-08 MED ORDER — IBUPROFEN 600 MG PO TABS: 600.0000 mg | ORAL_TABLET | Freq: Three times a day (TID) | ORAL | 0 refills | Status: DC | PRN

## 2017-02-08 MED ORDER — LISINOPRIL-HYDROCHLOROTHIAZIDE 10-12.5 MG PO TABS: 1.0000 | ORAL_TABLET | Freq: Every day | ORAL | 1 refills | Status: DC

## 2017-02-08 MED ORDER — CYCLOBENZAPRINE HCL 5 MG PO TABS: 5.0000 mg | ORAL_TABLET | Freq: Three times a day (TID) | ORAL | 1 refills | Status: DC | PRN

## 2017-02-08 NOTE — Progress Notes (Signed)
9/10/20189:00 AM  Jackie Wells 1971/01/26, 46 y.o. female [id number]  Chief Complaint  Patient presents with  . Back Pain    X fell last night- back and left shoulder pain  . Medication Refill    lisinopril    HPI:   Patient is a 46 y.o. female who presents today for back and left shoulder pain that started after she slid down a grassy slope last night. She denies any numbness or tingling, weakness, bowel or bladder issues. She denies any previous back or shoulder issues. She has not taken any medication for this over than applying ben-gay which helps. She has not noticed any bruising.   Otherwise requesting refill of her BP med. States took this morning, tolerates it well. Denies any symptoms of hypotension.  Depression screen [facility] 2/9 02/08/2017 11/05/2016 10/03/2016  Decreased Interest 0 0 0  Down, Depressed, Hopeless 0 0 1  PHQ - 2 Score 0 0 1    No Known Allergies  Current Outpatient Prescriptions on File Prior to Visit  Medication Sig Dispense Refill  . atorvastatin (LIPITOR) 20 MG tablet Take 1 tablet (20 mg total) by mouth daily. 90 tablet 3  . blood glucose meter kit and supplies KIT Please check fasting blood sugar in the morning. 1 each 0  . CONTOUR NEXT TEST test strip USE TO TEST BLOOD SUGAR IN THE MORNING 100 each 3  . Ferrous Sulfate (IRON) 325 (65 FE) MG TABS Take 1 tablet by mouth daily. 90 each 3  . insulin glargine (LANTUS) 100 unit/mL SOPN Inject 0.15 mLs (15 Units total) into the skin at bedtime. 15 mL 5  . Insulin Pen Needle (PEN NEEDLES) 32G X 4 MM MISC 1 each by Does not apply route daily. 100 each 1  . sitaGLIPtin-metformin (JANUMET) 50-1000 MG tablet Take 1 tablet by mouth 2 (two) times daily with a meal. 180 tablet 1   No current facility-administered medications on file prior to visit.     Past Medical History:  Diagnosis Date  . Anemia   . Diabetes mellitus without complication ([place])   . Hyperlipidemia   . Hypertension     History  reviewed. No pertinent surgical history.  Social History  Substance Use Topics  . Smoking status: Never Smoker  . Smokeless tobacco: Never Used  . Alcohol use No    History reviewed. No pertinent family history.  Review of Systems  Respiratory: Negative for cough and shortness of breath.   Cardiovascular: Negative for chest pain and leg swelling.  Musculoskeletal: Positive for back pain, falls and joint pain.  Neurological: Negative for tingling, sensory change and focal weakness.  Endo/Heme/Allergies: Does not bruise/bleed easily.     OBJECTIVE:  Blood pressure 110/76, pulse 60, temperature 97.8 F (36.6 C), temperature source Oral, resp. rate 18, height 5' 4.96" (1.65 m), weight 225 lb 6.4 oz (102.2 kg), SpO2 96 %.  Physical Exam  Constitutional: She is oriented to person, place, and time and well-developed, well-nourished, and in no distress.  HENT:  Head: Normocephalic and atraumatic.  Mouth/Throat: Oropharynx is clear and moist. No oropharyngeal exudate.  Eyes: Pupils are equal, round, and reactive to light. EOM are normal. No scleral icterus.  Neck: Neck supple.  Cardiovascular: Normal rate, regular rhythm and normal heart sounds.  Exam reveals no gallop and no friction rub.   No murmur heard. Pulmonary/Chest: Effort normal and breath sounds normal. She has no wheezes. She has no rales.  Musculoskeletal: Normal range of motion. She  exhibits tenderness (bilateral thoracic and lumbar paraspinals). She exhibits no edema or deformity.  Neurological: She is alert and oriented to person, place, and time. She has normal reflexes. She displays no weakness. No sensory deficit. Gait normal.  Skin: Skin is warm and dry. No abrasion and no bruising noted.       ASSESSMENT and PLAN:  1. Other acute back pain Discussed conservative measures. Rx for ibuprofen and flexeril given. Meds r/se/b discussed. Provided patient educational handout  2. Essential hypertension At goal  today, refilled medication. Keep upcoming appt with PCP - lisinopril-hydrochlorothiazide (PRINZIDE,ZESTORETIC) 10-12.5 MG tablet; Take 1 tablet by mouth daily.  Dispense: 30 tablet; Refill: [place], MD [facility] [place], [place] [address] Ph.  [phone] Fax [phone]

## 2017-02-08 NOTE — Patient Instructions (Addendum)
     IF you received an x-ray today, you will receive an invoice from [facility]. Please contact [facility] at [phone] with questions or concerns regarding your invoice.   IF you received labwork today, you will receive an invoice from [place]. Please contact [facility] at [phone] with questions or concerns regarding your invoice.   Our billing staff will not be able to assist you with questions regarding bills from these companies.  You will be contacted with the lab results as soon as they are available. The fastest way to get your results is to activate your My Chart account. Instructions are located on the last page of this paperwork. If you have not heard from [place] regarding the results in 2 weeks, please contact this office.     Dolor musculoesqueltico (Musculoskeletal Pain) El dolor musculoesqueltico comprende dolores y Scientist, research (life sciences) en los msculos y en los [place]. Este dolor puede ocurrir en cualquier parte del cuerpo. INSTRUCCIONES PARA EL CUIDADO EN EL HOGAR  Solo tome medicamentos para calmar el dolor, el malestar o bajar la fiebre, segn las indicaciones del mdico.  Podr seguir con todas las actividades a menos que stas le ocasionen ms [employer]. Cuando el dolor disminuya, retome las actividades habituales lentamente. Aumente gradualmente la intensidad y la duracin de sus actividades o del ejercicio.  Durante los perodos de dolor intenso, el reposo en cama puede ser beneficioso. Acustese o sintese en cualquier posicin que sea cmoda, pero salga de la cama y camine al menos cada varias horas.  Si se lo indican, aplique hielo sobre la zona de la lesin. ? Ponga el hielo en una bolsa plstica. ? Coloque una [employer] piel y la bolsa de hielo. ? Coloque el hielo durante 24minutos, 2 a 3veces por da.  SOLICITE ATENCIN MDICA SI:  El dolor empeora.  El dolor no se alivia con los [employer].  Pierde la funcionalidad en la zona del  dolor si este se manifiesta en los brazos, las piernas o el cuello.  Esta informacin no tiene Marine scientist el consejo del mdico. Asegrese de hacerle al mdico cualquier pregunta que tenga. Document Released: 02/25/2005 Document Revised: 08/10/2011 Document Reviewed: 01/20/2013 Elsevier Interactive Patient Education  2017 [employer].

## 2017-02-11 ENCOUNTER — Ambulatory Visit (INDEPENDENT_AMBULATORY_CARE_PROVIDER_SITE_OTHER): Payer: BLUE CROSS/BLUE SHIELD | Admitting: Urgent Care

## 2017-02-11 ENCOUNTER — Encounter: Payer: Self-pay | Admitting: Urgent Care

## 2017-02-11 VITALS — BP 106/74 | HR 87 | Temp 98.3°F | Resp 16 | Ht 64.0 in | Wt 231.6 lb

## 2017-02-11 DIAGNOSIS — I10 Essential (primary) hypertension: Secondary | ICD-10-CM | POA: Diagnosis not present

## 2017-02-11 DIAGNOSIS — E119 Type 2 diabetes mellitus without complications: Secondary | ICD-10-CM | POA: Diagnosis not present

## 2017-02-11 DIAGNOSIS — Z794 Long term (current) use of insulin: Secondary | ICD-10-CM | POA: Diagnosis not present

## 2017-02-11 DIAGNOSIS — E782 Mixed hyperlipidemia: Secondary | ICD-10-CM | POA: Diagnosis not present

## 2017-02-11 LAB — POCT GLYCOSYLATED HEMOGLOBIN (HGB A1C): Hemoglobin A1C: 6.7

## 2017-02-11 MED ORDER — LISINOPRIL-HYDROCHLOROTHIAZIDE 10-12.5 MG PO TABS: 1.0000 | ORAL_TABLET | Freq: Every day | ORAL | 1 refills | Status: DC

## 2017-02-11 NOTE — Progress Notes (Signed)
  MRN: [id number] DOB: 02-24-1971  Subjective:   Jackie Wells is a 46 y.o. female presenting for chief complaint of Diabetes (no problems); Follow-up; and Medication Refill (lisinopril)  DM - Currently managed with Lantus, sitagliptin-metformin. Home blood sugar checks range between 90-130. Admits non-compliance with her diet. Patient denies blurred vision, polydipsia, chest pain, nausea, vomiting, abdominal pain, hematuria, polyuria, skin infections, numbness or tingling. Checks her feet daily, denies foot concerns. Denies smoking cigarettes or drinking alcohol.   Jackie Wells has a current medication list which includes the following prescription(s): atorvastatin, blood glucose meter kit and supplies, contour next test, cyclobenzaprine, iron, ibuprofen, insulin glargine, pen needles, lisinopril-hydrochlorothiazide, and sitagliptin-metformin. Also has No Known Allergies.  Jackie Wells  has a past medical history of Anemia; Diabetes mellitus without complication ([place]); Hyperlipidemia; and Hypertension. Also  has no past surgical history on file.  Objective:   Vitals: BP 106/74   Pulse 87   Temp 98.3 F (36.8 C) (Oral)   Resp 16   Ht '5\' 4"'$  (1.626 m)   Wt 231 lb 9.6 oz (105.1 kg)   SpO2 95%   BMI 39.75 kg/m   BP Readings from Last 3 Encounters:  02/11/17 106/74  02/08/17 110/76  11/05/16 110/76   Physical Exam  Constitutional: She is oriented to person, place, and time. She appears well-developed and well-nourished.  HENT:  Mouth/Throat: Oropharynx is clear and moist.  Eyes: No scleral icterus.  Cardiovascular: Normal rate, regular rhythm and intact distal pulses.  Exam reveals no gallop and no friction rub.   No murmur heard. Pulmonary/Chest: No respiratory distress. She has no wheezes. She has no rales.  Abdominal: Soft. Bowel sounds are normal. She exhibits no distension and no mass. There is no tenderness. There is no guarding.  Neurological: She is alert and oriented  to person, place, and time.  Skin: Skin is warm and dry.  Psychiatric: She has a normal mood and affect.   Results for orders placed or performed in visit on 02/11/17 (from the past 24 hour(s))  POCT glycosylated hemoglobin (Hb A1C)     Status: None   Collection Time: 02/11/17  5:34 PM  Result Value Ref Range   Hemoglobin A1C 6.7    Assessment and Plan :   1. Uncontrolled type 2 diabetes mellitus without complication, without long-term current use of insulin ([facility]) - Dramatically improved. Will double check a1c by send out. Labs pending. Continue current regimen. Follow up in 6 months. - Comprehensive metabolic panel - POCT glycosylated hemoglobin (Hb A1C) - Hemoglobin A1c  2. Essential hypertension - Well controlled, refills provided. - lisinopril-hydrochlorothiazide (PRINZIDE,ZESTORETIC) 10-12.5 MG tablet; Take 1 tablet by mouth daily.  Dispense: 90 tablet; Refill: 1  3. Mixed hyperlipidemia - Lipid panel pending - No refills needed at this point. Okay to refill up to 1 year from today's visit.  Jaynee Eagles, PA-C [facility] [phone] 02/11/2017  5:21 PM

## 2017-02-11 NOTE — Patient Instructions (Addendum)
Prevencin de la diabetes mellitus tipo2 (Preventing Type 2 Diabetes Mellitus) La diabetes tipo2 (diabetes mellitus tipo2) es una enfermedad a largo plazo (crnica) que afecta los niveles de azcar en la sangre (glucosa). Normalmente, una hormona llamada insulina estimula el ingreso de la glucosa en las clulas del cuerpo. Las clulas usan la glucosa para Dealer. En la diabetes tipo2, puede presentarse uno de los siguientes problemas, o ambos:  El organismo no produce la cantidad suficiente de [place].  El organismo no responde de [place] a la insulina que produce (resistencia a la insulina). La resistencia a la insulina o la falta de esta hormona hace que el exceso de glucosa se acumule en la sangre, en lugar de ir a las clulas. Como consecuencia, se desarrolla glucemia alta (hiperglucemia), que puede causar muchas complicaciones. El sobrepeso o la obesidad, y Catering manager un estilo de vida inactivo (sedentario) pueden aumentar el riesgo de tener diabetes. La diabetes tipo2 se puede retardar o evitar al realizar ciertos cambios en la alimentacin y en el estilo de vida. QU CAMBIOS EN LA ALIMENTACIN SE PUEDEN HACER?  Consuma comidas y colaciones saludables regularmente. Lleve con usted una colacin saludable para cuando tenga [employer] Energy, por [place], una fruta o un puado de frutos secos.  Coma carne [place] y protenas con bajo contenido de grasas saturadas, como pollo, pescado, huevos blancos y frijoles. Evite las carnes procesadas.  Coma mucha fruta y verdura, y Ardelia Mems cantidad importante de cereales no procesados (cereales integrales). Se recomienda que consuma lo siguiente: ? De 1 a 2tazas de frutas US Airways. ? De 2 a 3tazas de [employer]W [employer]. ? [place] (170g) a 8onzas (227g) de cereales integrales todos los [place], como avena, [place], trigo [place], arroz integral, quinua y mijo.  Productos lcteos con bajo contenido de [place], [place], yogur y [place].  Alimentos que contengan grasas saludables, como frutos secos, Musician, aceite de [place] y aceite de canola.  Beba agua [place]. Evite bebidas que contengan ms azcar, como gaseosas y t [place].  Siga las indicaciones del mdico con respecto a las restricciones especficas para las comidas o bebidas.  Controle la cantidad de comida que consume en un momento dado (tamao de la porcin). ? Revise las etiquetas de los alimentos para conocer el tamao de la porcin. ? Utilice una balanza de cocina para pesar las cantidades de alimentos.  [place] o cocine al vapor los alimentos en vez de frerlos. Cocine con agua o caldo en vez de aceite o manteca.  Limite la ingesta de lo siguiente: ? Sal (sodio). No consuma ms de 1cucharadita (2400mg ) de sodio por da. Si tiene [place] cardiopata o hipertensin arterial, consuma menos de  o de cucharadita (1500mg ) de sodio por da. ? Grasas saturadas. Es la grasa que se encuentra en estado slido a temperatura ambiente, como la [place] o la grasa de la carne. QU CAMBIOS EN EL ESTILO DE VIDA SE PUEDEN HACER? Actividad  Haga actividad fsica de intensidad moderada durante al menos 65minutos como mnimo 5das por semana, o tanto como le haya indicado el mdico.  Pregntele al mdico qu actividades son seguras para usted. Una combinacin de actividades puede ser la mejor opcin, por ejemplo, caminar, practicar natacin, andar en bicicleta y hacer entrenamiento de fuerza.  Trate de agregar la actividad fsica a Conservation officer, nature. Por ejemplo: ? Estacione en lugares que estn ms alejados de lo habitual para poder caminar ms. Por ejemplo, estacione en una esquina  alejada del estacionamiento cuando vaya a la oficina o a la tienda de comestibles. ? D una caminata durante su hora de almuerzo. ? Utilice las Clinical cytogeneticist del ascensor o de las escaleras mecnicas. Prdida de peso  Baje de peso segn se le indique. El mdico  puede determinar cuntos kilos tiene que bajar y [place] a que adelgace de Geographical information systems officer segura.  Si tiene sobrepeso u obesidad, es posible que se le indique bajar, por lo menos del 5% al 7% del Engineer, site. Alcohol y tabaco  Limite el consumo de alcohol a no ms de 1 medida por da si es mujer y no est Music therapist, y 2 medidas por da si es hombre. Una medida equivale a 12onzas de cerveza, 5onzas de vino o 1onzas de bebidas alcohlicas de alta graduacin.  No consuma ningn producto que contenga tabaco, lo que incluye cigarrillos, tabaco de Higher education careers adviser y Psychologist, sport and exercise. Si necesita ayuda para dejar de fumar, consulte al [employer]. Co[place] con el mdico  Contrlese el nivel sanguneo de glucosa con frecuencia como se lo haya indicado el mdico.  Analice los factores de riesgo y cmo puede reducir el riesgo de tener diabetes.  Hgase las pruebas de [employer] se lo haya indicado el mdico. Puede hacerse pruebas de deteccin de forma peridica, especialmente si presenta ciertos factores de riesgo para la diabetes tipo2.  Haga una cita con un especialista en alimentacin y nutricin (nutricionista certificado). Un nutricionista certificado puede ayudarlo a preparar un plan de alimentacin saludable, y a comprender los tamaos de las porciones y las etiquetas de los alimentos. POR QU ESTOS CAMBIOS SON IMPORTANTES?  Al hacer cambios en el estilo de vida y la alimentacin, es posible prevenir o retardar la diabetes tipo2 y los problemas de salud relacionados.  Puede ser difcil reconocer los signos de la diabetes tipo2. La mejor manera de evitar los posibles daos al organismo es tomar medidas para prevenir la enfermedad antes de presentar sntomas. QU PUEDE SUCEDER SI NO SE REALIZAN CAMBIOS?  Los niveles sanguneos de glucosa pueden seguir aumentando. Es peligroso Systems analyst glucemia alta durante mucho tiempo. Demasiada glucosa en la sangre puede daar los vasos sanguneos, el  corazn, los riones, los nervios y los ojos.  Puede desarrollar prediabetes o diabetes tipo2. La diabetes tipo2 puede producir muchos problemas de salud crnicos y complicaciones, por ejemplo: ? Cardiopata. ? Ictus. ? Ceguera. ? Enfermedad renal. ? Depresin. ? Mala Avery Dennison y en las piernas, que podra llevar a la extraccin quirrgica (amputacin) en casos graves. DNDE ENCONTRAR ASISTENCIA:  Pdale al mdico que le recomiende a un nutricionista certificado, a Radio broadcast assistant para el cuidado de la diabetes o un programa para Sports coach de [place].  Busque grupos para bajar de peso locales o en lnea.  Inscrbase en un gimnasio, club de preparacin fsica o grupo de actividades al [employer], [place] un club para salir a Writer. DNDE ENCONTRAR MS INFORMACIN: Para obtener ms informacin sobre la diabetes y la prevencin de la diabetes, visite los siguientes sitios web:  Asociacin Americana de la Diabetes (American Diabetes Association, ADA): www.diabetes.[place] Diabetes y las Enfermedades Digestivas y [place] [facility] of Diabetes and Digestive and Kidney Diseases): FindSpin.nl Para obtener ms informacin sobre una alimentacin saludable, visite los siguientes sitios web:  Choose My Plate (MiPlato), Departamento de Agricultura de EE.UU. (U.S. Department of Agriculture, Engineer, structural): http://wiley-williams.com/  [employer] Dietary Guidelines (Pautas de alimentacin) de la Oficina de Prevencin de [place] y Promocin de  la Edison International Barista of Disease Prevention and Health Promotion, [place]): SurferLive.at Resumen  Puede reducir el riesgo de desarrollar diabetes tipo2 al aumentar la actividad fsica, comer alimentos saludables y bajar de [place], segn se le indique.  Hable con el mdico sobre el riesgo de desarrollar diabetes tipo2. Pregntele [employer] Auto  de sangre o las pruebas de  deteccin que deba [place]. Esta informacin no tiene Marine scientist el consejo del mdico. Asegrese de hacerle al mdico cualquier pregunta que tenga. Document Released: 07/09/2015 Document Revised: 07/09/2015 Document Reviewed: 07/09/2015 Elsevier Interactive Patient Education  2018 Reynolds [employer].   Hipertensin Hypertension El trmino hipertensin es otra forma de denominar a la presin arterial elevada. La presin arterial elevada fuerza al corazn a trabajar ms para bombear la sangre. Esto puede causar problemas con el paso del [place]. Una lectura de presin arterial est compuesta por 2 nmeros. Hay un nmero superior (sistlico) sobre un nmero inferior (diastlico). Lo ideal es tener la presin arterial por debajo de 120/80. Las decisiones saludables pueden ayudarle a disminuir su presin arterial. Es posible que necesite medicamentos que le ayuden a disminuir su presin arterial si:  Su presin arterial no disminuye mediante decisiones saludables.  Su presin arterial est por encima de 130/80.  Siga estas instrucciones en su casa: Comida y bebida  Si se lo indican, siga el plan de alimentacin de DASH (Dietary Approaches to Stop Hypertension, Maneras de alimentarse para detener la hipertensin). Esta dieta incluye: ? Que la mitad del plato de cada comida sea de frutas y verduras. ? Que un cuarto del plato de cada comida sea de cereales integrales. Los cereales integrales incluyen pasta integral, arroz integral y pan integral. ? Comer y beber productos lcteos con bajo contenido de [place], como leche descremada o yogur bajo en grasas. ? Que un cuarto del plato de cada comida sea de protenas bajas en grasa (magras). Las protenas bajas en grasa incluyen pescado, pollo sin piel, huevos, frijoles y tofu. ? Evitar consumir carne grasa, carne curada y procesada, o pollo con piel. ? Evitar consumir alimentos prehechos o procesados.  Consuma menos de 1500 mg de sal (sodio) por  da.  Limite el consumo de alcohol a no ms de 1 medida por da si es mujer y no est Music therapist y a 2 medidas por da si es hombre. Una medida equivale a 12onzas de cerveza, 5onzas de vino o 1onzas de bebidas alcohlicas de alta graduacin. Estilo de vida  Trabaje con su mdico para mantenerse en un peso saludable o para perder peso. Pregntele a su mdico cul es el peso recomendable para usted.  Realice al menos 30 minutos de ejercicio que haga que se acelere su corazn (ejercicio Arboriculturist) la [employer] Financial de la [place]. Estos pueden incluir caminar, nadar o andar en bicicleta.  Realice al menos 30 minutos de ejercicio que fortalezca sus msculos (ejercicios de resistencia) al menos 3 das a la [place]. Estos pueden incluir levantar pesas o hacer pilates.  No consuma ningn producto que contenga nicotina o tabaco. Esto incluye cigarrillos y cigarrillos electrnicos. Si necesita ayuda para dejar de fumar, consulte al [employer].  Controle su presin arterial en su casa tal como le indic el mdico.  Concurra a todas las visitas de control como se lo haya indicado el mdico. Esto es importante. Medicamentos  Delphi de venta libre y los recetados solamente como se lo haya indicado el mdico. Siga cuidadosamente las indicaciones.  No omita las dosis de medicamentos para  la presin arterial. Los medicamentos pierden eficacia si omite dosis. El hecho de omitir las dosis tambin [place] el riesgo de otros problemas.  Pregntele a su mdico a qu efectos secundarios o reacciones a los Careers information officer. Comunquese con un mdico si:  Piensa que tiene [place] reaccin a los medicamentos que est tomando.  Tiene dolores de cabeza frecuentes (recurrentes).  Siente mareos.  Tiene hinchazn en los tobillos.  Tiene problemas de visin. Solicite ayuda de inmediato si:  Siente un dolor de cabeza muy intenso.  Comienza a sentirse confundido.  Se siente dbil  o adormecido.  Siente que va a desmayarse.  Siente un dolor muy intenso en: ? El pecho. ? El vientre (abdomen).  Devuelve (vomita) ms de una vez.  Tiene dificultad para respirar. Resumen  El trmino hipertensin es otra forma de denominar a la presin arterial elevada.  Las decisiones saludables pueden ayudarle a disminuir su presin arterial. Si no puede controlar su presin arterial mediante decisiones saludables, es posible que deba tomar medicamentos. Esta informacin no tiene Marine scientist el consejo del mdico. Asegrese de hacerle al mdico cualquier pregunta que tenga. Document Released: 11/05/2009 Document Revised: 04/29/2016 Document Reviewed: 04/29/2016 Elsevier Interactive Patient Education  2018 [place] diabetes mellitus y los alimentos (Diabetes Mellitus and Food) Es importante que controle su nivel de azcar en la sangre (glucosa). El nivel de glucosa en sangre depende en gran medida de lo que usted come. Comer alimentos saludables en las cantidades [place] a lo largo del Training and development officer, aproximadamente a la misma hora [employer], lo ayudar a Chief Technology Officer su nivel de Multimedia programmer. Tambin puede ayudarlo a retrasar o Patent attorney de la diabetes mellitus. Comer de Affiliated Computer Services saludable incluso puede ayudarlo a Chartered loss adjuster de presin arterial y a Science writer o Theatre manager un peso saludable. Entre las recomendaciones generales para alimentarse y Audiological scientist los alimentos de forma saludable, se incluyen las siguientes:  Respetar las comidas principales y comer colaciones con regularidad. Evitar pasar largos perodos sin comer con el fin de perder peso.  Seguir una dieta que consista principalmente en alimentos de origen vegetal, como frutas, vegetales, frutos secos, legumbres y cereales integrales.  Utilizar mtodos de coccin a baja temperatura, como hornear, en lugar de mtodos de coccin a alta temperatura, como frer en abundante aceite. Trabaje con el  nutricionista para aprender a Financial planner nutricional de las etiquetas de los alimentos. CMO PUEDEN AFECTARME LOS ALIMENTOS? Carbohidratos Los carbohidratos afectan el nivel de glucosa en sangre ms que cualquier otro tipo de alimento. El nutricionista lo ayudar a Teacher, adult education cuntos carbohidratos puede consumir en cada comida y ensearle a contarlos. El recuento de carbohidratos es importante para mantener la glucosa en sangre en un nivel saludable, en especial si utiliza insulina o toma determinados medicamentos para la diabetes mellitus. Alcohol El alcohol puede provocar disminuciones sbitas de la glucosa en sangre (hipoglucemia), en especial si utiliza insulina o toma determinados medicamentos para la diabetes mellitus. La hipoglucemia es una afeccin que puede poner en peligro la vida. Los sntomas de la hipoglucemia (somnolencia, mareos y Data processing manager) son similares a los sntomas de haber consumido mucho alcohol. Si el mdico lo autoriza a beber alcohol, hgalo con moderacin y siga estas pautas:  Las mujeres no deben beber ms de un trago por da, y los hombres no deben beber ms de dos tragos por Training and development officer. Un trago es igual a: ? 12 onzas (355 ml) de cerveza ? 5 onzas de vino (150  ml) de vino ? 1,5onzas (26ml) de bebidas espirituosas  No beba con el estmago vaco.  Mantngase hidratado. Beba agua, gaseosas dietticas o t helado sin azcar.  Las gaseosas comunes, los jugos y otros refrescos podran contener muchos carbohidratos y se Civil Service fast streamer. QU ALIMENTOS NO SE RECOMIENDAN? Cuando haga las elecciones de alimentos, es importante que recuerde que todos los alimentos son distintos. Algunos tienen menos nutrientes que otros por porcin, aunque podran tener la misma cantidad de caloras o carbohidratos. Es difcil darle al cuerpo lo que necesita cuando consume alimentos con menos nutrientes. Estos son algunos ejemplos de alimentos que debera evitar ya que contienen muchas  caloras y carbohidratos, pero pocos nutrientes:  Physicist, medical trans (la mayora de los alimentos procesados incluyen grasas trans en la etiqueta de Informacin nutricional).  Gaseosas comunes.  Jugos.  Caramelos.  Dulces, como tortas, pasteles, rosquillas y [place].  Comidas fritas. QU ALIMENTOS PUEDO COMER? Consuma alimentos ricos en nutrientes, que nutrirn el cuerpo y lo mantendrn saludable. Los alimentos que debe comer tambin dependern de varios factores, como:  Las caloras que necesita.  Los medicamentos que toma.  Su peso.  El nivel de glucosa en [place].  El [place] de presin arterial.  El nivel de colesterol. Debe consumir una amplia variedad de alimentos, por ejemplo:  Protenas. ? Cortes de Nucor Corporation. ? Protenas con bajo contenido de grasas saturadas, como pescado, clara de huevo y frijoles. Evite las carnes procesadas.  Frutas y vegetales. ? [place] y Photographer que pueden ayudar a [employer] niveles sanguneos de [place], como [place], mangos y batatas.  Productos lcteos. ? Elija productos lcteos sin grasa o con bajo contenido de [place], como [place], yogur y [place].  Cereales, panes, pastas y arroz. ? Elija cereales integrales, como panes multicereales, avena en grano y arroz integral. Estos alimentos pueden ayudar a controlar la presin arterial.  Daphene Jaeger. ? Alimentos que contengan grasas saludables, como frutos secos, Musician, aceite de [place], aceite de canola y pescado. TODOS LOS QUE PADECEN DIABETES MELLITUS TIENEN EL [place] PLAN DE [place]? Dado que todas las personas que padecen diabetes mellitus son distintas, no hay un solo plan de comidas que funcione para todos. Es muy importante que se rena con un nutricionista que lo ayudar a crear un plan de comidas adecuado para usted. Esta informacin no tiene Marine scientist el consejo del mdico. Asegrese de hacerle al mdico cualquier pregunta que tenga. Document Released: 08/25/2007 Document  Revised: 06/08/2014 Document Reviewed: 04/14/2013 Elsevier Interactive Patient Education  2017 Reynolds [employer].

## 2017-02-12 LAB — COMPREHENSIVE METABOLIC PANEL
ALK PHOS: 65 IU/L (ref 39–117)
ALT: 13 IU/L (ref 0–32)
AST: 13 IU/L (ref 0–40)
Albumin/Globulin Ratio: 1.5 (ref 1.2–2.2)
Albumin: 4.3 g/dL (ref 3.5–5.5)
BUN/Creatinine Ratio: 21 (ref 9–23)
BUN: 20 mg/dL (ref 6–24)
Bilirubin Total: 0.3 mg/dL (ref 0.0–1.2)
CHLORIDE: 103 mmol/L (ref 96–106)
CO2: 21 mmol/L (ref 20–29)
Calcium: 9.4 mg/dL (ref 8.7–10.2)
Creatinine, Ser: 0.95 mg/dL (ref 0.57–1.00)
GFR calc Af Amer: 83 mL/min/{1.73_m2} (ref >59)
GFR calc non Af Amer: 72 mL/min/{1.73_m2} (ref >59)
GLOBULIN, TOTAL: 2.8 g/dL (ref 1.5–4.5)
Glucose: 99 mg/dL (ref 65–99)
POTASSIUM: 4.7 mmol/L (ref 3.5–5.2)
SODIUM: 142 mmol/L (ref 134–144)
Total Protein: 7.1 g/dL (ref 6.0–8.5)

## 2017-02-12 LAB — LIPID PANEL
CHOLESTEROL TOTAL: 132 mg/dL (ref 100–199)
Chol/HDL Ratio: 2.9 ratio (ref 0.0–4.4)
HDL: 45 mg/dL (ref >39)
LDL CALC: 62 mg/dL (ref 0–99)
TRIGLYCERIDES: 126 mg/dL (ref 0–149)
VLDL Cholesterol Cal: 25 mg/dL (ref 5–40)

## 2017-02-19 LAB — HEMOGLOBIN A1C
ESTIMATED AVERAGE GLUCOSE: 143 mg/dL
HEMOGLOBIN A1C: 6.6 % — AB (ref 4.8–5.6)

## 2017-03-04 ENCOUNTER — Telehealth: Payer: Self-pay | Admitting: Urgent Care

## 2017-03-04 ENCOUNTER — Other Ambulatory Visit: Payer: Self-pay | Admitting: *Deleted

## 2017-03-04 DIAGNOSIS — E1165 Type 2 diabetes mellitus with hyperglycemia: Secondary | ICD-10-CM

## 2017-03-04 MED ORDER — SITAGLIPTIN PHOS-METFORMIN HCL 50-1000 MG PO TABS: 1.0000 | ORAL_TABLET | Freq: Two times a day (BID) | ORAL | 0 refills | Status: DC

## 2017-03-04 NOTE — Telephone Encounter (Signed)
Pt left message on referrals phone stating she needs a refill of her Janumet. Pt can be reached at [phone]. Please advise.

## 2017-03-24 ENCOUNTER — Telehealth: Payer: Self-pay | Admitting: Urgent Care

## 2017-03-30 NOTE — Telephone Encounter (Signed)
Pt needs a refill on the Janumet and Atorvastatin.  I see request from the pharmacy on the 24 for the [place], but that is all.  She came by the office this evening about this and says she only has a few pills left. [phone].  Uses the [place] at [facility].

## 2017-03-31 ENCOUNTER — Other Ambulatory Visit: Payer: Self-pay

## 2017-03-31 DIAGNOSIS — E782 Mixed hyperlipidemia: Secondary | ICD-10-CM

## 2017-03-31 DIAGNOSIS — E119 Type 2 diabetes mellitus without complications: Secondary | ICD-10-CM

## 2017-03-31 MED ORDER — ATORVASTATIN CALCIUM 20 MG PO TABS: 20.0000 mg | ORAL_TABLET | Freq: Every day | ORAL | 1 refills | Status: DC

## 2017-03-31 NOTE — Progress Notes (Unsigned)
Pt wanted Janumet and Atorvastatin refilled. Janumet refilled 10/4 with 90 day supply - pt unaware Atorvastatin given 08/2016 for [facility] yr at [facility].  Pt has changed pharmacies and wants called to [facility].  Re sent for remaining 6 mo to [facility].

## 2017-05-26 ENCOUNTER — Telehealth: Payer: Self-pay | Admitting: Urgent Care

## 2017-05-26 NOTE — Telephone Encounter (Signed)
Copied from [place] [id number]. Topic: General - Other >> May 26, 2017 11:27 AM Neva Seat wrote: Pt has no refills for Lisinopril 5mg .  She wants more refills.  She has none left  [facility] [address]  [phone]

## 2017-05-26 NOTE — Telephone Encounter (Signed)
Called and left message that there should be 1 refill that will last till March OV:02/11/17 LR: 02/11/17 90# 1 refill

## 2017-05-27 ENCOUNTER — Telehealth: Payer: Self-pay

## 2017-05-27 NOTE — Telephone Encounter (Signed)
Pt arrived at office with question re Lisinopril refill.  States she was told by pharmacy that she does not have any refills remaining.  Contacted pharmacy, Paediatric nurse at [facility].  Confirmed that pt does have one refill remaining for 90 tablets.  Advised pt.

## 2017-05-28 ENCOUNTER — Ambulatory Visit (INDEPENDENT_AMBULATORY_CARE_PROVIDER_SITE_OTHER): Payer: BLUE CROSS/BLUE SHIELD | Admitting: Family Medicine

## 2017-05-28 ENCOUNTER — Other Ambulatory Visit: Payer: Self-pay

## 2017-05-28 ENCOUNTER — Encounter: Payer: Self-pay | Admitting: Family Medicine

## 2017-05-28 VITALS — BP 126/84 | HR 97 | Temp 98.8°F | Ht 65.35 in | Wt 237.0 lb

## 2017-05-28 DIAGNOSIS — Z1231 Encounter for screening mammogram for malignant neoplasm of breast: Secondary | ICD-10-CM

## 2017-05-28 DIAGNOSIS — N914 Secondary oligomenorrhea: Secondary | ICD-10-CM | POA: Diagnosis not present

## 2017-05-28 DIAGNOSIS — Z01419 Encounter for gynecological examination (general) (routine) without abnormal findings: Secondary | ICD-10-CM | POA: Diagnosis not present

## 2017-05-28 NOTE — Patient Instructions (Signed)
     IF you received an x-ray today, you will receive an invoice from [facility]. Please contact [facility] at [phone] with questions or concerns regarding your invoice.   IF you received labwork today, you will receive an invoice from [facility]. Please contact [facility] at [phone] with questions or concerns regarding your invoice.   Our billing staff will not be able to assist you with questions regarding bills from these companies.  You will be contacted with the lab results as soon as they are available. The fastest way to get your results is to activate your My Chart account. Instructions are located on the last page of this paperwork. If you have not heard from us regarding the results in 2 weeks, please contact this office.        IF you received an x-ray today, you will receive an invoice from [facility]. Please contact [facility] at [phone] with questions or concerns regarding your invoice.   IF you received labwork today, you will receive an invoice from [facility]. Please contact [facility] at [phone] with questions or concerns regarding your invoice.   Our billing staff will not be able to assist you with questions regarding bills from these companies.  You will be contacted with the lab results as soon as they are available. The fastest way to get your results is to activate your My Chart account. Instructions are located on the last page of this paperwork. If you have not heard from us regarding the results in 2 weeks, please contact this office.     

## 2017-05-28 NOTE — Progress Notes (Signed)
12/28/20183:28 PM  [place] Sep 11, 1970, 46 y.o. female [id number]  Chief Complaint  Patient presents with  . Gynecologic Exam    AT LEAST 10 YRS SINCE LAST ONE    HPI:   Patient is a 46 y.o. female with past medical history significant for DM2 and HTN who presents today for CPE  [id number] Last pap about 10 years, denies abnormal paps Had one ovary removed about 20 years ago 2/2 cyst Menarche at age 80 Menses irregular, has had 2 in 2018, last one in aug Denies any hot flashes, mood changes, vaginal dryness Last mammogram in 2013, birads 2  Depression screen [facility] 2/9 05/28/2017 02/11/2017 02/08/2017  Decreased Interest 0 0 0  Down, Depressed, Hopeless 0 0 0  PHQ - 2 Score 0 0 0    No Known Allergies  Prior to Admission medications   Medication Sig Start Date End Date Taking? Authorizing Provider  atorvastatin (LIPITOR) 20 MG tablet Take 1 tablet (20 mg total) by mouth daily. 03/31/17  Yes Jaynee Eagles, PA-C  lisinopril-hydrochlorothiazide (PRINZIDE,ZESTORETIC) 10-12.5 MG tablet Take 1 tablet by mouth daily. 02/11/17  Yes Jaynee Eagles, PA-C  sitaGLIPtin-metformin (JANUMET) 50-1000 MG tablet Take 1 tablet by mouth 2 (two) times daily with a meal. 03/04/17  Yes Jaynee Eagles, PA-C  blood glucose meter kit and supplies KIT Please check fasting blood sugar in the morning. Patient not taking: Reported on 05/28/2017 09/25/16   Jaynee Eagles, PA-C  CONTOUR NEXT TEST test strip USE TO TEST BLOOD SUGAR IN THE MORNING Patient not taking: Reported on 05/28/2017 12/25/16   Jaynee Eagles, PA-C  cyclobenzaprine (FLEXERIL) 5 MG tablet Take 1 tablet (5 mg total) by mouth 3 (three) times daily as needed for muscle spasms. Patient not taking: Reported on 05/28/2017 02/08/17   Rutherford Guys, MD  Ferrous Sulfate (IRON) 325 (65 FE) MG TABS Take 1 tablet by mouth daily. Patient not taking: Reported on 05/28/2017 11/08/14   Jaynee Eagles, PA-C  ibuprofen (ADVIL,MOTRIN) 600 MG tablet Take 1 tablet  (600 mg total) by mouth every 8 (eight) hours as needed. Patient not taking: Reported on 05/28/2017 02/08/17   Rutherford Guys, MD  insulin glargine (LANTUS) 100 unit/mL SOPN Inject 0.15 mLs (15 Units total) into the skin at bedtime. Patient not taking: Reported on 05/28/2017 10/03/16   Jaynee Eagles, PA-C  Insulin Pen Needle (PEN NEEDLES) 32G X 4 MM MISC 1 each by Does not apply route daily. Patient not taking: Reported on 05/28/2017 10/03/16   Jaynee Eagles, PA-C    Past Medical History:  Diagnosis Date  . Anemia   . Diabetes mellitus without complication ([place])   . Hyperlipidemia   . Hypertension     Past Surgical History:  Procedure Laterality Date  . LAPAROSCOPIC UNILATERAL SALPINGO OOPHERECTOMY      Social History   Tobacco Use  . Smoking status: Never Smoker  . Smokeless tobacco: Never Used  Substance Use Topics  . Alcohol use: No    Alcohol/week: 0.0 oz    Family History  Problem Relation Age of Onset  . Hyperlipidemia Mother   . Hypertension Mother   . Hypertension Father     Review of Systems  Constitutional: Negative for chills and fever.  Respiratory: Negative for cough and shortness of breath.   Cardiovascular: Negative for chest pain, palpitations and leg swelling.  Gastrointestinal: Negative for abdominal pain, constipation, diarrhea, nausea and vomiting.  Genitourinary:       Neg breast lumps or nipple  discharge Neg vaginal discharge, pelvic pain, dyspareunia  Neurological: Negative for tingling, sensory change and focal weakness.    OBJECTIVE:  Blood pressure 126/84, pulse 97, temperature 98.8 F (37.1 C), temperature source Oral, height 5' 5.35" (1.66 m), weight 237 lb (107.5 kg), SpO2 98 %.  Physical Exam  Constitutional: She is oriented to person, place, and time and well-developed, well-nourished, and in no distress.  HENT:  Head: Normocephalic and atraumatic.  Right Ear: Hearing, tympanic membrane, external ear and ear canal normal.  Left Ear:  Hearing, tympanic membrane, external ear and ear canal normal.  Mouth/Throat: Oropharynx is clear and moist.  Eyes: EOM are normal. Pupils are equal, round, and reactive to light.  Neck: Neck supple. No thyromegaly present.  Cardiovascular: Normal rate, regular rhythm, normal heart sounds and intact distal pulses. Exam reveals no gallop and no friction rub.  No murmur heard. Pulmonary/Chest: Effort normal and breath sounds normal. She has no wheezes. She has no rales. Right breast exhibits no inverted nipple, no mass, no nipple discharge and no skin change. Left breast exhibits no inverted nipple, no mass, no nipple discharge and no skin change.  Abdominal: Soft. Bowel sounds are normal. She exhibits no distension and no mass. There is no tenderness.  Genitourinary: Uterus is not enlarged, not fixed and not tender.  Cervix is not fixed. Cervix exhibits no motion tenderness and no lesion. Vulva exhibits no lesion and no rash. Vagina exhibits normal mucosa and no lesion. Watery  odorless  white and vaginal discharge found.  Genitourinary Comments: Unable to palpate either ovary, but there was no mass or tenderness in adnexal area  Musculoskeletal: Normal range of motion. She exhibits no edema.  Lymphadenopathy:    She has no cervical adenopathy.    She has no axillary adenopathy.       Right: No supraclavicular adenopathy present.       Left: No supraclavicular adenopathy present.  Neurological: She is alert and oriented to person, place, and time. She has normal reflexes. Gait normal.  Skin: Skin is warm and dry.  Psychiatric: Mood and affect normal.  Nursing note and vitals reviewed.    ASSESSMENT and PLAN 1. Encounter for annual routine gynecological examination Routine HCM labs ordered. HCM reviewed/discussed. Anticipatory guidance regarding healthy weight, lifestyle and choices given.   - Pap IG w/ reflex to HPV when ASC-U  2. Visit for screening mammogram - MM DIGITAL SCREENING  BILATERAL; Future  3. Secondary oligomenorrhea - TSH - FSH/LH - T4, Free - US PELVIC COMPLETE WITH TRANSVAGINAL; Future  Return in about 1 year (around 05/28/2018).    Rutherford Guys, MD Primary Care at [place], [place] [address] Ph.  [phone] Fax [phone]

## 2017-05-29 LAB — TSH: TSH: 6.12 u[IU]/mL — ABNORMAL HIGH (ref 0.450–4.500)

## 2017-05-29 LAB — FSH/LH
FSH: 11.1 m[IU]/mL
LH: 26.1 m[IU]/mL

## 2017-05-29 LAB — PAP IG W/ RFLX HPV ASCU: PAP Smear Comment: 0

## 2017-05-29 LAB — T4, FREE: Free T4: 1.13 ng/dL (ref 0.82–1.77)

## 2017-06-01 ENCOUNTER — Encounter: Payer: Self-pay | Admitting: Family Medicine

## 2017-06-18 ENCOUNTER — Ambulatory Visit: Payer: BLUE CROSS/BLUE SHIELD | Admitting: Urgent Care

## 2017-06-18 ENCOUNTER — Encounter: Payer: Self-pay | Admitting: Urgent Care

## 2017-06-18 ENCOUNTER — Other Ambulatory Visit: Payer: Self-pay

## 2017-06-18 VITALS — BP 114/82 | HR 100 | Temp 99.3°F | Resp 16 | Ht 65.0 in | Wt 233.0 lb

## 2017-06-18 DIAGNOSIS — E038 Other specified hypothyroidism: Secondary | ICD-10-CM

## 2017-06-18 DIAGNOSIS — E039 Hypothyroidism, unspecified: Secondary | ICD-10-CM | POA: Diagnosis not present

## 2017-06-18 DIAGNOSIS — I10 Essential (primary) hypertension: Secondary | ICD-10-CM

## 2017-06-18 DIAGNOSIS — D649 Anemia, unspecified: Secondary | ICD-10-CM | POA: Diagnosis not present

## 2017-06-18 DIAGNOSIS — E782 Mixed hyperlipidemia: Secondary | ICD-10-CM | POA: Diagnosis not present

## 2017-06-18 DIAGNOSIS — E119 Type 2 diabetes mellitus without complications: Secondary | ICD-10-CM | POA: Diagnosis not present

## 2017-06-18 LAB — POCT GLYCOSYLATED HEMOGLOBIN (HGB A1C): HEMOGLOBIN A1C: 7.7

## 2017-06-18 MED ORDER — ATORVASTATIN CALCIUM 20 MG PO TABS: 20.0000 mg | ORAL_TABLET | Freq: Every day | ORAL | 1 refills | Status: DC

## 2017-06-18 MED ORDER — LISINOPRIL-HYDROCHLOROTHIAZIDE 10-12.5 MG PO TABS: 1.0000 | ORAL_TABLET | Freq: Every day | ORAL | 1 refills | Status: DC

## 2017-06-18 MED ORDER — SITAGLIPTIN PHOS-METFORMIN HCL 50-1000 MG PO TABS: 1.0000 | ORAL_TABLET | Freq: Two times a day (BID) | ORAL | 1 refills | Status: DC

## 2017-06-18 NOTE — Progress Notes (Signed)
    MRN: [id number] DOB: [birth year]  Subjective:   Jackie Wells is a 47 y.o. female presenting for follow up on Hypertension, HL, Diabetes, medication refills.   HTN - Currently managed with lis-HCTZ. Patient is not checking blood pressure at home. Avoids salt in diet, is exercising. Denies dizziness, chronic headache, blurred vision, chest pain, shortness of breath, heart racing, palpitations, nausea, vomiting, abdominal pain, hematuria, lower leg swelling. Denies smoking cigarettes or drinking alcohol.  HL - Managed with atorvastatin.  DM - Managed with sitagliptin-metformin. Last a1c done 02/10/2017 and was 6.6%. Stopped insulin then. Has made significant dietary changes. Denies polydipsia, polyuria, numbness or tingling.   Jackie Wells has a current medication list which includes the following prescription(s): atorvastatin, lisinopril-hydrochlorothiazide, sitagliptin-metformin, blood glucose meter kit and supplies, contour next test, cyclobenzaprine, iron, ibuprofen, insulin glargine, and pen needles. Also has No Known Allergies.  Jackie Wells  has a past medical history of Anemia, Diabetes mellitus without complication ([place]), Hyperlipidemia, and Hypertension. Also  has a past surgical history that includes Laparoscopic unilateral salpingo oophorectomy.  Objective:   Vitals: BP 114/82   Pulse 100   Temp 99.3 F (37.4 C) (Oral)   Resp 16   Ht _0  (1.651 m)   Wt 233 lb (105.7 kg)   LMP  (LMP Unknown)   SpO2 96%   BMI 38.77 kg/m   Physical Exam  Constitutional: She is oriented to person, place, and time. She appears well-developed and well-nourished.  HENT:  Mouth/Throat: Oropharynx is clear and moist.  Eyes: No scleral icterus.  Neck: Normal range of motion. Neck supple. No thyromegaly present.  Cardiovascular: Normal rate, regular rhythm and intact distal pulses. Exam reveals no gallop and no friction rub.  No murmur heard. Pulmonary/Chest: No respiratory distress.  She has no wheezes. She has no rales.  Abdominal: Soft. Bowel sounds are normal. She exhibits no distension and no mass. There is no tenderness. There is no guarding.  Neurological: She is alert and oriented to person, place, and time.  Skin: Skin is warm and dry.  Psychiatric: She has a normal mood and affect.   Results for orders placed or performed in visit on 06/18/17 (from the past 24 hour(s))  POCT glycosylated hemoglobin (Hb A1C)     Status: None   Collection Time: 06/18/17  4:15 PM  Result Value Ref Range   Hemoglobin A1C 7.7    Assessment and Plan :   Type 2 diabetes mellitus without complication, without long-term current use of insulin ([place]) - Plan: Comprehensive metabolic panel, POCT glycosylated hemoglobin (Hb A1C), atorvastatin (LIPITOR) 20 MG tablet  Mixed hyperlipidemia - Plan: Lipid panel, atorvastatin (LIPITOR) 20 MG tablet  Essential hypertension - Plan: Comprehensive metabolic panel, lisinopril-hydrochlorothiazide (PRINZIDE,ZESTORETIC) 10-12.5 MG tablet  Anemia, unspecified type - Plan: CBC  Subclinical hypothyroidism - Plan: Thyroid Panel With TSH  Labs pending, refills provided. Encouraged continued efforts at weight loss and healthy lifestyle. Consider referral to endo if abnormal thyroid labs persist. Recheck in 6 months.  Jaynee Eagles, PA-C Primary Care at [place] [phone] 06/18/2017  3:50 PM

## 2017-06-18 NOTE — Patient Instructions (Addendum)
We recommend that you schedule a mammogram for breast cancer screening. Typically, you do not need a referral to do this. Please contact a local imaging center to schedule your mammogram.  [facility] - [phone]  *ask for the Radiology [place] ([place]) - [phone] or [phone]  [facility] - [phone] [place] [phone] [facility] - [phone]  *ask for the [facility] - [phone]  *ask for the Radiology Department [facility] - [phone]  *ask for the Mammography Department [place] - [phone]     Diabetes mellitus y nutricin Diabetes Mellitus and Nutrition Si sufre de diabetes (diabetes mellitus), es muy importante tener hbitos alimenticios saludables debido a que sus niveles de Designer, television/film set sangre (glucosa) se ven afectados en gran medida por lo que come y bebe. Comer alimentos saludables en las cantidades [place], aproximadamente a la [employer], [place] ayudar a:  Aeronautical engineer glucemia.  Disminuir el riesgo de sufrir una enfermedad cardaca.  Mejorar la presin arterial.  Science writer o mantener un peso saludable.  Todas las personas que sufren de diabetes son diferentes y cada una tiene necesidades diferentes en cuanto a un plan de alimentacin. El mdico puede recomendarle que trabaje con un especialista en dietas y nutricin (nutricionista) para Financial trader plan para usted. Su plan de alimentacin puede variar segn factores como:  Las caloras que necesita.  Los medicamentos que toma.  Su peso.  Sus niveles de glucemia, presin arterial y colesterol.  Su nivel de [place].  Otras afecciones que tenga, como enfermedades cardacas o renales.  Cmo me afectan los carbohidratos? Los carbohidratos afectan el nivel de glucemia ms que cualquier otro tipo de alimento. La ingesta  de carbohidratos naturalmente aumenta la cantidad glucosa en la sangre. El recuento de carbohidratos es un mtodo destinado a Catering manager un registro de la cantidad de carbohidratos que se ingieren. El recuento de carbohidratos es importante para Theatre manager la glucemia a un nivel saludable, en especial si utiliza insulina o toma determinados medicamentos por va oral para la diabetes. Es importante saber la cantidad de carbohidratos que se pueden ingerir en cada comida sin correr Engineer, manufacturing. Esto es Psychologist, forensic. El nutricionista puede ayudarlo a calcular la cantidad de carbohidratos que debe ingerir en cada comida y colacin. Los alimentos que contienen carbohidratos incluyen:  Pan, cereal, arroz, pasta y galletas.  Papas y maz.  Guisantes, frijoles y lentejas.  Leche y Estate agent.  Lambert Mody y [place].  Postres, como pasteles, galletitas, helado y caramelos.  Cmo me afecta el alcohol? El alcohol puede provocar disminuciones sbitas de la glucemia (hipoglucemia), en especial si utiliza insulina o toma determinados medicamentos por va oral para la diabetes. La hipoglucemia es una afeccin potencialmente mortal. Los sntomas de la hipoglucemia (somnolencia, mareos y confusin) son similares a los sntomas de haber consumido demasiado alcohol. Si el mdico afirma que el alcohol es seguro para usted, siga estas pautas:  Limite el consumo de alcohol a no ms de 1 medida por da si es mujer y no est Music therapist, y a 2 medidas si es hombre. Una medida equivale a 12oz (365ml) de cerveza, 5oz (11ml) de vino o 1oz (9ml) de bebidas de alta graduacin alcohlica.  No beba con el estmago vaco.  Mantngase hidratado con agua, gaseosas dietticas o t helado sin azcar.  Tenga en cuenta que las gaseosas comunes, los [place]  y otros refrescos pueden contener mucha azcar y se deben contar como carbohidratos.  Consejos para seguir Photographer las etiquetas de los alimentos  Comience por  controlar el tamao de la porcin en la etiqueta. La cantidad de caloras, carbohidratos, grasas y otros nutrientes mencionados en la etiqueta se basan en una porcin del alimento. Muchos alimentos contienen ms de una porcin por envase.  Verifique la cantidad total de gramos (g) de carbohidratos totales en una porcin. Puede calcular la cantidad de porciones de carbohidratos al dividir el total de carbohidratos por 15. Por ejemplo, si un alimento posee un total de 30g de carbohidratos, equivale a 2 porciones de carbohidratos.  Verifique la cantidad de gramos (g) de grasas saturadas y grasas trans en una porcin. Escoja alimentos que no contengan grasa o que tengan un bajo contenido.  Controle la cantidad de miligramos (mg) de sodio en una porcin. La [employer] de las personas deben limitar la ingesta de sodio total a menos de 2300mg  por Training and development officer.  Siempre consulte la informacin nutricional de los alimentos etiquetados como "con bajo contenido de grasa" o "sin grasa". Estos alimentos pueden ser ms altos en azcar agregada o en carbohidratos refinados y deben evitarse.  Hable con el nutricionista para identificar sus objetivos diarios en cuanto a los nutrientes mencionados en la etiqueta. De compras  Evite comprar alimentos procesados, enlatados o prehechos. Estos alimentos tienden a TEFL teacher cantidad de [place], sodio y azcar agregada.  Compre en la zona exterior de la tienda de comestibles. Esta incluye frutas y [employer], granos a granel, carnes frescas y productos lcteos frescos. Coccin  Utilice mtodos de coccin a baja temperatura, como hornear, en lugar de mtodos de coccin a alta temperatura, como frer en abundante aceite.  Cocine con aceites saludables, como el aceite de [place], canola o [place].  Evite cocinar con manteca, crema o carnes con alto contenido de grasa. Planificacin de las comidas  International Paper comidas y las colaciones de forma regular, preferentemente a la  misma hora todos [place]. Evite pasar largos perodos de tiempo sin comer.  Consuma alimentos ricos en fibra, como frutas frescas, verduras, frijoles y cereales integrales. Consulte al nutricionista sobre cuntas porciones de carbohidratos puede consumir en cada comida.  Consuma entre 4 y 6 onzas de protenas magras por da, como carnes [place], pollo, pescado, [employer] o tofu. 1 onza equivale a 1 onza de carne, pollo o pescado, 1 huevo, o 1/4 taza de tofu.  Coma algunos alimentos por da que contengan grasas saludables, como aguacates, frutos secos, semillas y pescado. Estilo de vida   Controle su nivel de glucemia con regularidad.  Haga ejercicio al menos 74minutos, 5das o ms por semana, o como se lo haya indicado el mdico.  Tome los [employer] se lo haya indicado el mdico.  No consuma ningn producto que contenga nicotina o tabaco, como cigarrillos y Psychologist, sport and exercise. Si necesita ayuda para dejar de fumar, consulte al [employer] Corporation con un asesor o instructor en diabetes para identificar estrategias para controlar el estrs y cualquier desafo emocional y social. Cules son algunas de las preguntas que puedo hacerle a mi mdico?  Es necesario que me rena con Radio broadcast assistant en diabetes?  Es necesario que me rena con un nutricionista?  A qu nmero puedo llamar si tengo preguntas?  Cules son los mejores momentos para controlar la glucemia? Dnde encontrar ms informacin:  Asociacin Americana de la Diabetes (American Diabetes Association): diabetes.org/food-and-fitness/food  Academia de Nutricin y Information systems manager (  Academy of Nutrition and Dietetics): PokerClues.dk  [place] Diabetes y Organ y [facility] of Diabetes and Digestive and Kidney Diseases) ([place], NIH):  ContactWire.be Resumen  Un plan de alimentacin saludable lo ayudar a Aeronautical engineer glucemia y Theatre manager un estilo de vida saludable.  Trabajar con un especialista en dietas y nutricin (nutricionista) puede ayudarlo a Insurance claims handler de alimentacin para usted.  Tenga en cuenta que los carbohidratos y el alcohol tienen efectos inmediatos en sus niveles de glucemia. Es importante contar los carbohidratos y consumir alcohol con prudencia. Esta informacin no tiene Marine scientist el consejo del mdico. Asegrese de hacerle al mdico cualquier pregunta que tenga. Document Released: 08/25/2007 Document Revised: 09/07/2016 Document Reviewed: 09/07/2016 Elsevier Interactive Patient Education  2018 [place] Hypothyroidism El hipotiroidismo es un trastorno de la tiroides. una glndula grande ubicada en la parte anterior e inferior del cuello. La tiroides Administrator hormonas que controlan el funcionamiento del [place]. En los casos de hipotiroidismo, la glndula no produce la cantidad suficiente de estas hormonas. Cules son las causas? Las causas del hipotiroidismo pueden incluir lo siguiente:  Infecciones virales.  Embarazo.  Un ataque del sistema de defensa (sistema inmunitario) a la tiroides.  Ciertos medicamentos.  Defectos congnitos.  Radioterapias anteriores en la cabeza o el cuello.  Tratamiento previo con yodo radioactivo.  Extirpacin quirrgica previa de una parte o de toda la tiroides.  Problemas con la glndula ubicada en el centro del cerebro (hipfisis).  Cules son los signos o los sntomas? Los signos y los sntomas de hipotiroidismo pueden ser los siguientes:  Sensacin de falta de Teacher, early years/pre ([place]).  Incapacidad para tolerar el fro.  Aumento de peso que no puede explicarse por un cambio en la dieta o en los hbitos de ejercicio fsico.  Piel  seca.  Pelo grueso.  Irregularidades menstruales.  Ralentizacin de los procesos de pensamiento.  Estreimiento.  Tristeza o depresin.  Cmo se diagnostica? El mdico puede diagnosticar el hipotiroidismo con anlisis de sangre y Engineer, materials. Cmo se trata? El hipotiroidismo se trata con medicamentos que reemplazan las hormonas que el cuerpo no produce. Despus de Biochemist, clinical, pueden pasar varias semanas hasta la desaparicin de los sntomas. Siga estas instrucciones en su casa:  Tome los medicamentos solamente como se lo haya indicado el mdico.  Si empieza a tomar medicamentos nuevos, infrmele al mdico.  Consulting civil engineer a todas las visitas de control como se lo haya indicado el mdico. Esto es importante. A medida que la enfermedad mejora, es posible que haya que modificar las dosis. Tendr que hacerse anlisis de sangre peridicamente, de modo que el mdico pueda controlar la enfermedad. Comunquese con un mdico si:  Los sntomas no mejoran con Dispensing optician.  Est tomando medicamentos sustitutivos de la tiroides y: ? Copywriter, advertising. ? Siente temblores. ? Est ansioso. ? [place] de peso rpidamente. ? No puede Agricultural engineer. ? Tiene cambios emocionales. ? Tiene diarrea. ? Se siente dbil. Solicite ayuda de inmediato si:  Electronics engineer.  Tiene latidos cardacos irregulares o siente dolor en el pecho.  Nota que la frecuencia cardaca est acelerada. Esta informacin no tiene Marine scientist el consejo del mdico. Asegrese de hacerle al mdico cualquier pregunta que tenga. Document Released: 05/18/2005 Document Revised: 08/24/2016 Document Reviewed: 10/03/2013 Elsevier Interactive Patient Education  2018 [employer].     IF you received an x-ray today, you will receive  an Pharmacologist from [facility] Radiology. Please contact [facility] Radiology at [phone] with questions or concerns regarding your invoice.   IF you received labwork  today, you will receive an invoice from [place]. Please contact [facility] at [phone] with questions or concerns regarding your invoice.   Our billing staff will not be able to assist you with questions regarding bills from these companies.  You will be contacted with the lab results as soon as they are available. The fastest way to get your results is to activate your My Chart account. Instructions are located on the last page of this paperwork. If you have not heard from [place] regarding the results in 2 weeks, please contact this office.

## 2017-06-19 LAB — COMPREHENSIVE METABOLIC PANEL
A/G RATIO: 1.4 (ref 1.2–2.2)
ALK PHOS: 67 IU/L (ref 39–117)
ALT: 18 IU/L (ref 0–32)
AST: 16 IU/L (ref 0–40)
Albumin: 4.4 g/dL (ref 3.5–5.5)
BILIRUBIN TOTAL: 0.3 mg/dL (ref 0.0–1.2)
BUN/Creatinine Ratio: 24 — ABNORMAL HIGH (ref 9–23)
BUN: 24 mg/dL (ref 6–24)
CHLORIDE: 99 mmol/L (ref 96–106)
CO2: 19 mmol/L — ABNORMAL LOW (ref 20–29)
Calcium: 9.7 mg/dL (ref 8.7–10.2)
Creatinine, Ser: 1.02 mg/dL — ABNORMAL HIGH (ref 0.57–1.00)
GFR calc non Af Amer: 66 mL/min/{1.73_m2} (ref >59)
GFR, EST AFRICAN AMERICAN: 76 mL/min/{1.73_m2} (ref >59)
GLUCOSE: 188 mg/dL — AB (ref 65–99)
Globulin, Total: 3.1 g/dL (ref 1.5–4.5)
POTASSIUM: 4.6 mmol/L (ref 3.5–5.2)
Sodium: 139 mmol/L (ref 134–144)
TOTAL PROTEIN: 7.5 g/dL (ref 6.0–8.5)

## 2017-06-19 LAB — THYROID PANEL WITH TSH
FREE THYROXINE INDEX: 1.9 (ref 1.2–4.9)
T3 UPTAKE RATIO: 26 % (ref 24–39)
T4, Total: 7.3 ug/dL (ref 4.5–12.0)
TSH: 4.76 u[IU]/mL — ABNORMAL HIGH (ref 0.450–4.500)

## 2017-06-19 LAB — LIPID PANEL
CHOLESTEROL TOTAL: 128 mg/dL (ref 100–199)
Chol/HDL Ratio: 2.9 ratio (ref 0.0–4.4)
HDL: 44 mg/dL (ref >39)
LDL Calculated: 47 mg/dL (ref 0–99)
Triglycerides: 185 mg/dL — ABNORMAL HIGH (ref 0–149)
VLDL CHOLESTEROL CAL: 37 mg/dL (ref 5–40)

## 2017-06-19 LAB — CBC
Hematocrit: 33.9 % — ABNORMAL LOW (ref 34.0–46.6)
Hemoglobin: 12 g/dL (ref 11.1–15.9)
MCH: 30.2 pg (ref 26.6–33.0)
MCHC: 35.4 g/dL (ref 31.5–35.7)
MCV: 85 fL (ref 79–97)
Platelets: 283 10*3/uL (ref 150–379)
RBC: 3.97 x10E6/uL (ref 3.77–5.28)
RDW: 13 % (ref 12.3–15.4)
WBC: 9.6 10*3/uL (ref 3.4–10.8)

## 2017-10-13 ENCOUNTER — Other Ambulatory Visit: Payer: Self-pay | Admitting: Family Medicine

## 2017-10-13 DIAGNOSIS — I10 Essential (primary) hypertension: Secondary | ICD-10-CM

## 2017-12-23 ENCOUNTER — Other Ambulatory Visit: Payer: Self-pay | Admitting: Urgent Care

## 2017-12-23 DIAGNOSIS — I10 Essential (primary) hypertension: Secondary | ICD-10-CM

## 2017-12-24 NOTE — Telephone Encounter (Signed)
Refill request for Lisinopril-Hydrochlorothiazide / LOV with Jackie Wells on 06/18/17 / Medication listed twice with same dosage. / Will send to provider for review

## 2017-12-24 NOTE — Telephone Encounter (Signed)
Copied from [place] [id number]. Topic: Quick Communication - Rx Refill/Question >> Dec 24, 2017  5:02 PM Oliver Pila B wrote: Medication: lisinopril-hydrochlorothiazide (PRINZIDE,ZESTORETIC) 10-12.5 MG tablet [[id number]]  atorvastatin (LIPITOR) 20 MG tablet [[id number]]    Has the patient contacted their pharmacy? Yes.   (Agent: If no, request that the patient contact the pharmacy for the refill.) (Agent: If yes, when and what did the pharmacy advise?)  Preferred Pharmacy (with phone number [facility] street name): [facility]  Agent: Please be advised that RX refills may take up to 3 business days. We ask that you follow-up with your pharmacy.

## 2018-01-14 ENCOUNTER — Other Ambulatory Visit: Payer: Self-pay | Admitting: Urgent Care

## 2018-01-14 MED ORDER — SITAGLIPTIN PHOS-METFORMIN HCL 50-1000 MG PO TABS: 1.0000 | ORAL_TABLET | Freq: Two times a day (BID) | ORAL | 0 refills | Status: DC

## 2018-01-14 NOTE — Telephone Encounter (Signed)
Copied from [place] [id number]. Topic: Quick Communication - Rx Refill/Question >> Jan 14, 2018  5:36 PM Selinda Flavin B, NT wrote: Medication: sitaGLIPtin-metformin (JANUMET) 50-1000 MG tablet  Has the patient contacted their pharmacy? Yes.   (Agent: If no, request that the patient contact the pharmacy for the refill.) (Agent: If yes, when and what did the pharmacy advise?)  Preferred Pharmacy (with phone number or street name): [facility] [place], [place] - [address]  Agent: Please be advised that RX refills may take up to 3 business days. We ask that you follow-up with your pharmacy.

## 2018-01-25 ENCOUNTER — Other Ambulatory Visit: Payer: Self-pay

## 2018-01-25 ENCOUNTER — Telehealth: Payer: Self-pay | Admitting: Urgent Care

## 2018-01-25 DIAGNOSIS — I10 Essential (primary) hypertension: Secondary | ICD-10-CM

## 2018-01-25 MED ORDER — LISINOPRIL-HYDROCHLOROTHIAZIDE 10-12.5 MG PO TABS: 1.0000 | ORAL_TABLET | Freq: Every day | ORAL | 0 refills | Status: DC

## 2018-01-25 NOTE — Telephone Encounter (Signed)
Copied from [place]. Topic: Quick Communication - Rx Refill/Question >> Jan 25, 2018  2:52 PM Jarold Motto, Fraser Din wrote: Medication: lisinopril-hydrochlorothiazide (PRINZIDE,ZESTORETIC) 10-12.5 MG tablet [[id number]]   Has the patient contacted their pharmacy? No Preferred Pharmacy (with phone number or street name):[place], [place] - [address] [phone] (Phone) [phone] (Fax)   Agent: Please be advised that RX refills may take up to 3 business days. We ask that you follow-up with your pharmacy.

## 2018-03-03 ENCOUNTER — Other Ambulatory Visit: Payer: Self-pay | Admitting: Family Medicine

## 2018-03-03 DIAGNOSIS — I10 Essential (primary) hypertension: Secondary | ICD-10-CM

## 2018-03-03 NOTE — Telephone Encounter (Signed)
Copied from [place] [id number] [id number]. Topic: Quick Communication - Rx Refill/Question >> Mar 03, 2018  6:43 PM Waylan Rocher, [place] L wrote: Medication: lisinopril-hydrochlorothiazide (PRINZIDE,ZESTORETIC) 10-12.5 MG tablet (was seeing Jaynee Eagles, scheduled with Pamella Pert 03/22/2018)  Has the patient contacted their pharmacy? Yes.   (Agent: If no, request that the patient contact the pharmacy for the refill.) (Agent: If yes, when and what did the pharmacy advise?)  Preferred Pharmacy (with phone number or street name): [place], [place] - [address] [place] [address] Phone: [phone] Fax: [phone]  Agent: Please be advised that RX refills may take up to 3 business days. We ask that you follow-up with your pharmacy.

## 2018-03-04 MED ORDER — LISINOPRIL-HYDROCHLOROTHIAZIDE 10-12.5 MG PO TABS: 1.0000 | ORAL_TABLET | Freq: Every day | ORAL | 0 refills | Status: DC

## 2018-03-04 NOTE — Telephone Encounter (Signed)
Refill request for lisinopril-hctz 10-12.5 # 20 with 0 refills approved.  Pt has upcoming appt with [place] on 03/22/18.  No further refills without kept appt. [place]

## 2018-03-22 ENCOUNTER — Ambulatory Visit: Payer: BLUE CROSS/BLUE SHIELD | Admitting: Family Medicine

## 2018-03-22 ENCOUNTER — Other Ambulatory Visit: Payer: Self-pay

## 2018-03-22 ENCOUNTER — Encounter: Payer: Self-pay | Admitting: Family Medicine

## 2018-03-22 VITALS — BP 111/77 | HR 79 | Temp 98.1°F | Ht 65.0 in | Wt 217.2 lb

## 2018-03-22 DIAGNOSIS — E119 Type 2 diabetes mellitus without complications: Secondary | ICD-10-CM | POA: Diagnosis not present

## 2018-03-22 DIAGNOSIS — E039 Hypothyroidism, unspecified: Secondary | ICD-10-CM | POA: Diagnosis not present

## 2018-03-22 DIAGNOSIS — E038 Other specified hypothyroidism: Secondary | ICD-10-CM

## 2018-03-22 DIAGNOSIS — Z23 Encounter for immunization: Secondary | ICD-10-CM

## 2018-03-22 DIAGNOSIS — I10 Essential (primary) hypertension: Secondary | ICD-10-CM | POA: Diagnosis not present

## 2018-03-22 DIAGNOSIS — E782 Mixed hyperlipidemia: Secondary | ICD-10-CM

## 2018-03-22 LAB — POCT URINALYSIS DIP (MANUAL ENTRY)
Bilirubin, UA: NEGATIVE
Blood, UA: NEGATIVE
Glucose, UA: NEGATIVE mg/dL
Leukocytes, UA: NEGATIVE
Nitrite, UA: NEGATIVE
Protein Ur, POC: NEGATIVE mg/dL
Spec Grav, UA: 1.025 (ref 1.010–1.025)
Urobilinogen, UA: 0.2 E.U./dL
pH, UA: 5.5 (ref 5.0–8.0)

## 2018-03-22 LAB — POCT GLYCOSYLATED HEMOGLOBIN (HGB A1C): Hemoglobin A1C: 10.9 % — AB (ref 4.0–5.6)

## 2018-03-22 MED ORDER — LISINOPRIL-HYDROCHLOROTHIAZIDE 10-12.5 MG PO TABS: 1.0000 | ORAL_TABLET | Freq: Every day | ORAL | 1 refills | Status: DC

## 2018-03-22 MED ORDER — DULAGLUTIDE 0.75 MG/0.5ML ~~LOC~~ SOAJ: 0.7500 mg | SUBCUTANEOUS | 3 refills | Status: DC

## 2018-03-22 MED ORDER — SITAGLIPTIN PHOS-METFORMIN HCL 50-1000 MG PO TABS: 1.0000 | ORAL_TABLET | Freq: Two times a day (BID) | ORAL | 1 refills | Status: DC

## 2018-03-22 MED ORDER — ATORVASTATIN CALCIUM 20 MG PO TABS: 20.0000 mg | ORAL_TABLET | Freq: Every day | ORAL | 1 refills | Status: DC

## 2018-03-22 NOTE — Progress Notes (Signed)
Shay   

## 2018-03-22 NOTE — Progress Notes (Signed)
10/22/20192:50 PM  Jackie Wells [birth year], 47 y.o. female [id number]  Chief Complaint  Patient presents with  . Hypertension    follow up  . Diabetes    follow up  . Medication Refill    lipitor 57m, janumet 50-1021m lisinopril 10-12.65m465m  HPI:   Patient is a [age]o. female with past medical history significant for HTN, HLP, DM2, subclinical hypothyroidism who presents today for routine followup  Previous PCP MarJaynee Eaglesst visit Jan 2019  Urine microalb/crt: done today ACE: yes Statin: yes Pneumonia vaccine: 10/2016 Eye exam: goes yearly, referral made today Foot exam: done today Complications: none  Taking meds as prescribed Does not check cbgs Denies polyuria or polydipsia Does not exercise nor follow diet Used to be on lantus 15units, stopped in sept 2018, a1c 6.6 back then Hated the injections Has glucometer at home Has gone to DM educator  Fall Risk  03/22/2018 05/28/2017 02/11/2017 02/08/2017 11/05/2016  Falls in the past year? No No Yes Yes No  Number falls in past yr: - - 2 or more 1 -  Injury with Fall? - - (No Data) Yes -  Comment - - body aches back and left shoulder -     [facility] 03/22/2018 05/28/2017 02/11/2017  Decreased Interest 0 0 0  Down, Depressed, Hopeless 0 0 0  PHQ - 2 Score 0 0 0    No Known Allergies  Prior to Admission medications   Medication Sig Start Date End Date Taking? Authorizing Provider  atorvastatin (LIPITOR) 20 MG tablet Take 1 tablet (20 mg total) by mouth daily. 06/18/17  Yes ManJaynee EaglesA-C  blood glucose meter kit and supplies KIT Please check fasting blood sugar in the morning. 09/25/16  Yes ManJaynee EaglesA-C  lisinopril-hydrochlorothiazide (PRINZIDE,ZESTORETIC) 10-12.5 MG tablet Take 1 tablet by mouth daily. 01/25/18  Yes ManJaynee EaglesA-C  lisinopril-hydrochlorothiazide (PRINZIDE,ZESTORETIC) 10-12.5 MG tablet Take 1 tablet by mouth daily. 03/04/18  Yes StaForrest MoronD    sitaGLIPtin-metformin (JANUMET) 50-1000 MG tablet Take 1 tablet by mouth 2 (two) times daily with a meal. 01/14/18  Yes ManJaynee EaglesA-C    Past Medical History:  Diagnosis Date  . Anemia   . Diabetes mellitus without complication ([facility]CCLesage . Hyperlipidemia   . Hypertension     Past Surgical History:  Procedure Laterality Date  . LAPAROSCOPIC UNILATERAL SALPINGO OOPHERECTOMY      Social History   Tobacco Use  . Smoking status: Never Smoker  . Smokeless tobacco: Never Used  Substance Use Topics  . Alcohol use: No    Alcohol/week: 0.0 standard drinks    Family History  Problem Relation Age of Onset  . Hyperlipidemia Mother   . Hypertension Mother   . Hypertension Father     Review of Systems  Constitutional: Negative for chills and fever.  Respiratory: Negative for cough and shortness of breath.   Cardiovascular: Negative for chest pain, palpitations and leg swelling.  Gastrointestinal: Negative for abdominal pain, nausea and vomiting.     OBJECTIVE:  Blood pressure 111/77, pulse 79, temperature 98.1 F (36.7 C), temperature source Oral, height _0  (1.651 m), weight 217 lb 3.2 oz (98.5 kg), SpO2 97 %. Body mass index is 36.14 kg/m.   Wt Readings from Last 3 Encounters:  03/22/18 217 lb 3.2 oz (98.5 kg)  06/18/17 233 lb (105.7 kg)  05/28/17 237 lb (107.5 kg)   Physical Exam  Constitutional: She is oriented to  person, place, and time. She appears well-developed and well-nourished.  HENT:  Head: Normocephalic and atraumatic.  Mouth/Throat: Mucous membranes are normal.  Eyes: Pupils are equal, round, and reactive to light. Conjunctivae and EOM are normal. No scleral icterus.  Neck: Neck supple.  Pulmonary/Chest: Effort normal.  Neurological: She is alert and oriented to person, place, and time.  Skin: Skin is warm and dry.  Psychiatric: She has a normal mood and affect.  Nursing note and vitals reviewed.   Results for orders placed or performed in visit  on 03/22/18 (from the past 24 hour(s))  POCT glycosylated hemoglobin (Hb A1C)     Status: Abnormal   Collection Time: 03/22/18  2:43 PM  Result Value Ref Range   Hemoglobin A1C 10.9 (A) 4.0 - 5.6 %   HbA1c POC (<> result, manual entry)     HbA1c, POC (prediabetic range)     HbA1c, POC (controlled diabetic range)    POCT urinalysis dipstick     Status: Abnormal   Collection Time: 03/22/18  3:34 PM  Result Value Ref Range   Color, UA yellow yellow   Clarity, UA clear clear   Glucose, UA negative negative mg/dL   Bilirubin, UA negative negative   Ketones, POC UA trace (5) (A) negative mg/dL   Spec Grav, UA 1.025 1.010 - 1.025   Blood, UA negative negative   pH, UA 5.5 5.0 - 8.0   Protein Ur, POC negative negative mg/dL   Urobilinogen, UA 0.2 0.2 or 1.0 E.U./dL   Nitrite, UA Negative Negative   Leukocytes, UA Negative Negative   Previous a1c 7.7    ASSESSMENT and PLAN  1. Subclinical hypothyroidism Checking labs today, medications will be adjusted as needed.  - TSH  2. Essential hypertension Controlled. Continue current regime.  - CMP14+EGFR - Lipid panel  3. Type 2 diabetes mellitus without complication, without long-term current use of insulin (HCC) Uncontrolled. Starting trulicty. Discussed diet and exercise. Weight loss. - Ambulatory referral to Ophthalmology - POCT glycosylated hemoglobin (Hb A1C)  4. Need for prophylactic vaccination and inoculation against influenza - Flu Vaccine QUAD 36+ mos IM    Return in about 3 months (around 06/22/2018).    Rutherford Guys, MD Primary Care at [facility], [place] [address] Ph.  [phone] Fax [phone]

## 2018-03-22 NOTE — Patient Instructions (Signed)
° ° ° °  If you have lab work done today you will be contacted with your lab results within the next 2 weeks.  If you have not heard from us then please contact us. The fastest way to get your results is to register for My Chart. ° ° °IF you received an x-ray today, you will receive an invoice from [facility]. Please contact [facility] at [phone] with questions or concerns regarding your invoice.  ° °IF you received labwork today, you will receive an invoice from [facility]. Please contact [facility] at [phone] with questions or concerns regarding your invoice.  ° °Our billing staff will not be able to assist you with questions regarding bills from these companies. ° °You will be contacted with the lab results as soon as they are available. The fastest way to get your results is to activate your My Chart account. Instructions are located on the last page of this paperwork. If you have not heard from us regarding the results in 2 weeks, please contact this office. °  ° ° ° °

## 2018-03-23 LAB — CMP14+EGFR
ALT: 21 IU/L (ref 0–32)
AST: 18 IU/L (ref 0–40)
Albumin/Globulin Ratio: 1.5 (ref 1.2–2.2)
Albumin: 4.3 g/dL (ref 3.5–5.5)
Alkaline Phosphatase: 73 IU/L (ref 39–117)
BUN/Creatinine Ratio: 22 (ref 9–23)
BUN: 17 mg/dL (ref 6–24)
Bilirubin Total: 0.3 mg/dL (ref 0.0–1.2)
CO2: 24 mmol/L (ref 20–29)
Calcium: 9.6 mg/dL (ref 8.7–10.2)
Chloride: 98 mmol/L (ref 96–106)
Creatinine, Ser: 0.77 mg/dL (ref 0.57–1.00)
GFR calc Af Amer: 106 mL/min/{1.73_m2} (ref >59)
GFR calc non Af Amer: 92 mL/min/{1.73_m2} (ref >59)
Globulin, Total: 2.8 g/dL (ref 1.5–4.5)
Glucose: 174 mg/dL — ABNORMAL HIGH (ref 65–99)
Potassium: 4.6 mmol/L (ref 3.5–5.2)
Sodium: 138 mmol/L (ref 134–144)
Total Protein: 7.1 g/dL (ref 6.0–8.5)

## 2018-03-23 LAB — T4, FREE: Free T4: 1.33 ng/dL (ref 0.82–1.77)

## 2018-03-23 LAB — LIPID PANEL
Chol/HDL Ratio: 3.3 ratio (ref 0.0–4.4)
Cholesterol, Total: 142 mg/dL (ref 100–199)
HDL: 43 mg/dL (ref >39)
LDL Calculated: 70 mg/dL (ref 0–99)
Triglycerides: 147 mg/dL (ref 0–149)
VLDL Cholesterol Cal: 29 mg/dL (ref 5–40)

## 2018-03-23 LAB — MICROALBUMIN / CREATININE URINE RATIO
Creatinine, Urine: 116.2 mg/dL
Microalb/Creat Ratio: 12.5 mg/g creat (ref 0.0–30.0)
Microalbumin, Urine: 14.5 ug/mL

## 2018-03-23 LAB — TSH: TSH: 4.75 u[IU]/mL — ABNORMAL HIGH (ref 0.450–4.500)

## 2018-03-30 ENCOUNTER — Encounter: Payer: Self-pay | Admitting: Radiology

## 2018-04-01 ENCOUNTER — Other Ambulatory Visit: Payer: Self-pay | Admitting: Family Medicine

## 2018-04-01 NOTE — Telephone Encounter (Signed)
Copied from [place] [id number]. Topic: Quick Communication - Rx Refill/Question >> Apr 01, 2018 12:03 PM Keene Breath wrote: Medication: sitaGLIPtin-metformin (JANUMET) 50-1000 MG tablet  Patient called to request a refill for the above medication.  CB# [phone]  Preferred Pharmacy (with phone number or street name): [place], [place] - [address] [phone] (Phone) [phone] (Fax)

## 2018-07-22 ENCOUNTER — Other Ambulatory Visit: Payer: Self-pay | Admitting: Family Medicine

## 2018-07-22 MED ORDER — DULAGLUTIDE 0.75 MG/0.5ML ~~LOC~~ SOAJ: 0.7500 mg | SUBCUTANEOUS | 3 refills | Status: DC

## 2018-07-22 NOTE — Telephone Encounter (Signed)
Copied from [place] [id number]. Topic: Quick Communication - Rx Refill/Question >> Jul 22, 2018  8:04 AM Yvette Rack wrote: Medication: Dulaglutide (TRULICITY) 3.57 SV/7.7LT SOPN  Please refill until appt on 08-29-18 with santiago  Has the patient contacted their pharmacy? No. She called to make an appt (Agent: If no, request that the patient contact the pharmacy for the refill.) (Agent: If yes, when and what did the pharmacy advise?)  Preferred Pharmacy (with phone number or street name): [place], [place] - [address] [phone] (Phone) [phone] (Fax)    Agent: Please be advised that RX refills may take up to 3 business days. We ask that you follow-up with your pharmacy.

## 2018-08-29 ENCOUNTER — Ambulatory Visit: Payer: BLUE CROSS/BLUE SHIELD | Admitting: Family Medicine

## 2018-09-09 ENCOUNTER — Other Ambulatory Visit: Payer: Self-pay

## 2018-09-09 ENCOUNTER — Telehealth: Payer: BLUE CROSS/BLUE SHIELD | Admitting: Family Medicine

## 2018-09-21 ENCOUNTER — Other Ambulatory Visit: Payer: Self-pay | Admitting: Family Medicine

## 2018-09-21 DIAGNOSIS — I10 Essential (primary) hypertension: Secondary | ICD-10-CM

## 2018-09-21 DIAGNOSIS — E119 Type 2 diabetes mellitus without complications: Secondary | ICD-10-CM

## 2018-09-21 DIAGNOSIS — E782 Mixed hyperlipidemia: Secondary | ICD-10-CM

## 2018-10-12 ENCOUNTER — Other Ambulatory Visit: Payer: Self-pay | Admitting: Family Medicine

## 2018-10-27 ENCOUNTER — Other Ambulatory Visit: Payer: Self-pay | Admitting: Family Medicine

## 2018-10-27 DIAGNOSIS — I10 Essential (primary) hypertension: Secondary | ICD-10-CM

## 2018-10-27 NOTE — Telephone Encounter (Signed)
Requested medication (s) are due for refill today: Yes  Requested medication (s) are on the active medication list: Yes  Last refill: 1 month ago  Future visit scheduled: No  Notes to clinic: Unable to refill, appointment needed     Requested Prescriptions  Pending Prescriptions Disp Refills   lisinopril-hydrochlorothiazide (ZESTORETIC) 10-12.5 MG tablet [Pharmacy Med Name: Lisinopril-hydroCHLOROthiazide 10-12.5 MG Oral Tablet] 30 tablet 0    Sig: TAKE 1 TABLET BY MOUTH ONCE DAILY **  PATIENT  NEEDS  APPOINTMENT  FOR  MORE  MEDICATION  REFILLS**     Cardiovascular:  ACEI + Diuretic Combos Failed - 10/27/2018  5:16 PM      Failed - Na in normal range and within 180 days    Sodium  Date Value Ref Range Status  03/22/2018 138 134 - 144 mmol/L Final         Failed - K in normal range and within 180 days    Potassium  Date Value Ref Range Status  03/22/2018 4.6 3.5 - 5.2 mmol/L Final         Failed - Cr in normal range and within 180 days    Creat  Date Value Ref Range Status  07/08/2015 0.59 0.50 - 1.10 mg/dL Final   Creatinine, Ser  Date Value Ref Range Status  03/22/2018 0.77 0.57 - 1.00 mg/dL Final         Failed - Ca in normal range and within 180 days    Calcium  Date Value Ref Range Status  03/22/2018 9.6 8.7 - 10.2 mg/dL Final         Failed - Valid encounter within last 6 months    Recent Outpatient Visits          7 months ago Type 2 diabetes mellitus without complication, without long-term current use of insulin ([place])   [facility] at [facility], Lilia Argue, MD   1 year ago Type 2 diabetes mellitus without complication, without long-term current use of insulin [facility])   Primary Care at [place], [place]   1 year ago Encounter for annual routine gynecological examination   [facility], Lilia Argue, MD   1 year ago Controlled type 2 diabetes mellitus without complication, with long-term current use of insulin [facility])   Primary Care at  [facility], [place]   1 year ago Other acute back pain   [facility], Lilia Argue, MD             Passed - Patient is not pregnant      Passed - Last BP in normal range    BP Readings from Last 1 Encounters:  03/22/18 111/77

## 2018-11-05 NOTE — Telephone Encounter (Signed)
Patient has [place] and will find new provider

## 2018-11-14 ENCOUNTER — Other Ambulatory Visit: Payer: Self-pay | Admitting: Family Medicine

## 2018-11-16 NOTE — Telephone Encounter (Signed)
Patient has [employer]	[place] has to go to [facility]

## 2018-11-20 ENCOUNTER — Other Ambulatory Visit: Payer: Self-pay | Admitting: Family Medicine

## 2018-11-25 ENCOUNTER — Telehealth: Payer: Self-pay | Admitting: Family Medicine

## 2018-11-25 ENCOUNTER — Other Ambulatory Visit: Payer: Self-pay | Admitting: Family Medicine

## 2018-11-25 DIAGNOSIS — I10 Essential (primary) hypertension: Secondary | ICD-10-CM

## 2018-11-25 DIAGNOSIS — E119 Type 2 diabetes mellitus without complications: Secondary | ICD-10-CM

## 2018-11-25 DIAGNOSIS — E782 Mixed hyperlipidemia: Secondary | ICD-10-CM

## 2018-11-25 NOTE — Telephone Encounter (Signed)
Pt is requesting a courtesy refill on meds. Appt has been scheduled for 7/13

## 2018-11-25 NOTE — Telephone Encounter (Signed)
Forwarding medication refill to PCP for review. 

## 2018-11-28 NOTE — Telephone Encounter (Signed)
Rx was sent to pharmacy. 

## 2018-12-12 ENCOUNTER — Encounter: Payer: Self-pay | Admitting: Family Medicine

## 2018-12-12 ENCOUNTER — Other Ambulatory Visit: Payer: Self-pay

## 2018-12-12 ENCOUNTER — Ambulatory Visit (INDEPENDENT_AMBULATORY_CARE_PROVIDER_SITE_OTHER): Payer: BLUE CROSS/BLUE SHIELD | Admitting: Family Medicine

## 2018-12-12 VITALS — BP 121/85 | HR 86 | Temp 98.7°F | Ht 65.0 in | Wt 234.0 lb

## 2018-12-12 DIAGNOSIS — I10 Essential (primary) hypertension: Secondary | ICD-10-CM

## 2018-12-12 DIAGNOSIS — E782 Mixed hyperlipidemia: Secondary | ICD-10-CM | POA: Diagnosis not present

## 2018-12-12 DIAGNOSIS — E1165 Type 2 diabetes mellitus with hyperglycemia: Secondary | ICD-10-CM

## 2018-12-12 DIAGNOSIS — E039 Hypothyroidism, unspecified: Secondary | ICD-10-CM | POA: Diagnosis not present

## 2018-12-12 DIAGNOSIS — E038 Other specified hypothyroidism: Secondary | ICD-10-CM

## 2018-12-12 LAB — POCT GLYCOSYLATED HEMOGLOBIN (HGB A1C): Hemoglobin A1C: 10.8 % — AB (ref 4.0–5.6)

## 2018-12-12 MED ORDER — LISINOPRIL-HYDROCHLOROTHIAZIDE 10-12.5 MG PO TABS: 1.0000 | ORAL_TABLET | Freq: Every day | ORAL | 1 refills | Status: DC

## 2018-12-12 MED ORDER — METFORMIN HCL 1000 MG PO TABS: 1000.0000 mg | ORAL_TABLET | Freq: Two times a day (BID) | ORAL | 3 refills | Status: DC

## 2018-12-12 MED ORDER — ATORVASTATIN CALCIUM 40 MG PO TABS: 40.0000 mg | ORAL_TABLET | Freq: Every day | ORAL | 3 refills | Status: DC

## 2018-12-12 MED ORDER — TRULICITY 1.5 MG/0.5ML ~~LOC~~ SOAJ: 1.5000 mg | SUBCUTANEOUS | 5 refills | Status: DC

## 2018-12-12 NOTE — Progress Notes (Signed)
7/13/20202:46 PM  Jackie Wells [birth year], 48 y.o., female [id number]  Chief Complaint  Patient presents with  . Hypertension    refill on lipitor, lisinopril  . Diabetes    refill on diabetes med janumet  . Medication Reaction    thinks the medication is causing a issue with skin on arms    HPI:   Patient is a 48 y.o. female with past medical history significant for HTN, HLP, DM2, subclinical hypothyroidism who presents today for routine followup  Last OV Oct 6195 Started trulicity  Urine microalb/crt: Oct 2019, negative ACE: yes Statin: yes Pneumonia vaccine: 10/2016 Eye exam: needs appt for this year Foot exam: Oct 0932 Complications: none   Patient started checking her cbgs mostly in fasting, occ in the evening 14 day avg 236 Had been without medications as she ran out refills for about 6 weeks She denies any side effects Last week was able to restart medications Has given up all soda  Lab Results  Component Value Date   HGBA1C 10.9 (A) 03/22/2018   HGBA1C 7.7 06/18/2017   HGBA1C 6.6 (H) 02/18/2017   Lab Results  Component Value Date   MICROALBUR 0.9 07/08/2015   LDLCALC 70 03/22/2018   CREATININE 0.77 03/22/2018    Depression screen PHQ 2/9 12/12/2018 03/22/2018 05/28/2017  Decreased Interest 0 0 0  Down, Depressed, Hopeless 0 0 0  PHQ - 2 Score 0 0 0    Fall Risk  12/12/2018 03/22/2018 05/28/2017 02/11/2017 02/08/2017  Falls in the past year? 0 No No Yes Yes  Number falls in past yr: 0 - - 2 or more 1  Injury with Fall? 0 - - (No Data) Yes  Comment - - - body aches back and left shoulder     No Known Allergies  Prior to Admission medications   Medication Sig Start Date End Date Taking? Authorizing Provider  atorvastatin (LIPITOR) 20 MG tablet Take 1 tablet by mouth once daily 11/28/18  Yes Rutherford Guys, MD  blood glucose meter kit and supplies KIT Please check fasting blood sugar in the morning. 09/25/16  Yes Jaynee Eagles, PA-C   Dulaglutide (TRULICITY) 6.71 IW/5.8KD SOPN Inject 0.75 mg into the skin once a week. Please keep upcoming appointment 11/28/18  Yes Rutherford Guys, MD  JANUMET 50-1000 MG tablet TAKE 1 TABLET BY MOUTH TWICE DAILY WITH A MEAL . APPOINTMENT REQUIRED FOR FUTURE REFILLS 11/28/18  Yes Rutherford Guys, MD  lisinopril-hydrochlorothiazide (ZESTORETIC) 10-12.5 MG tablet Take 1 tablet by mouth daily. Please keep upcoming appointment. 11/28/18  Yes Rutherford Guys, MD    Past Medical History:  Diagnosis Date  . Anemia   . Diabetes mellitus without complication ([place])   . Hyperlipidemia   . Hypertension     Past Surgical History:  Procedure Laterality Date  . LAPAROSCOPIC UNILATERAL SALPINGO OOPHERECTOMY      Social History   Tobacco Use  . Smoking status: Never Smoker  . Smokeless tobacco: Never Used  Substance Use Topics  . Alcohol use: No    Alcohol/week: 0.0 standard drinks    Family History  Problem Relation Age of Onset  . Hyperlipidemia Mother   . Hypertension Mother   . Hypertension Father     Review of Systems  Constitutional: Negative for chills and fever.  Respiratory: Negative for cough and shortness of breath.   Cardiovascular: Negative for chest pain, palpitations and leg swelling.  Gastrointestinal: Negative for abdominal pain, nausea and vomiting.  OBJECTIVE:  Today's Vitals   12/12/18 1439  BP: 121/85  Pulse: 86  Temp: 98.7 F (37.1 C)  TempSrc: Oral  SpO2: 97%  Weight: 234 lb (106.1 kg)  Height: 5' 5"  (1.651 m)   Body mass index is 38.94 kg/m.  Wt Readings from Last 3 Encounters:  12/12/18 234 lb (106.1 kg)  03/22/18 217 lb 3.2 oz (98.5 kg)  06/18/17 233 lb (105.7 kg)    Physical Exam Vitals signs and nursing note reviewed.  Constitutional:      Appearance: She is well-developed.  HENT:     Head: Normocephalic and atraumatic.     Mouth/Throat:     Pharynx: No oropharyngeal exudate.  Eyes:     General: No scleral icterus.     Conjunctiva/sclera: Conjunctivae normal.     Pupils: Pupils are equal, round, and reactive to light.  Neck:     Musculoskeletal: Neck supple.  Cardiovascular:     Rate and Rhythm: Normal rate and regular rhythm.     Heart sounds: Normal heart sounds. No murmur. No friction rub. No gallop.   Pulmonary:     Effort: Pulmonary effort is normal.     Breath sounds: Normal breath sounds. No wheezing or rales.  Musculoskeletal:     Right lower leg: No edema.     Left lower leg: No edema.  Skin:    General: Skin is warm and dry.  Neurological:     Mental Status: She is alert and oriented to person, place, and time.     Results for orders placed or performed in visit on 12/12/18 (from the past 24 hour(s))  POCT glycosylated hemoglobin (Hb A1C)     Status: Abnormal   Collection Time: 12/12/18  3:28 PM  Result Value Ref Range   Hemoglobin A1C 10.8 (A) 4.0 - 5.6 %   HbA1c POC (<> result, manual entry)     HbA1c, POC (prediabetic range)     HbA1c, POC (controlled diabetic range)     ASSESSMENT and PLAN  1. Uncontrolled type 2 diabetes mellitus with hyperglycemia (HCC) D/c januvia, increase trulicity. Cont metformin. Discussed importance of LFM. Home cbg monitoring.  - POCT glycosylated hemoglobin (Hb A1C)  2. Essential hypertension Controlled. Continue current regime.  - CMP14+EGFR - lisinopril-hydrochlorothiazide (ZESTORETIC) 10-12.5 MG tablet; Take 1 tablet by mouth daily.  3. Mixed hyperlipidemia Increased atorvastatin. - Lipid panel - atorvastatin (LIPITOR) 40 MG tablet; Take 1 tablet (40 mg total) by mouth daily.  4. Subclinical hypothyroidism - TSH - T4, Free  Other orders - metFORMIN (GLUCOPHAGE) 1000 MG tablet; Take 1 tablet (1,000 mg total) by mouth 2 (two) times daily with a meal. - Dulaglutide (TRULICITY) 1.5 WU/9.8JX SOPN; Inject 1.5 mg into the skin once a week.  Return in about 3 months (around 03/14/2019).    Rutherford Guys, MD Primary Care at [facility], [place] [address] Ph.  [phone] Fax [phone]

## 2018-12-12 NOTE — Patient Instructions (Signed)
1. chequear la azucar todas las mananas  2. caminar 30 minutos todos los dias

## 2018-12-13 LAB — CMP14+EGFR
ALT: 22 IU/L (ref 0–32)
AST: 15 IU/L (ref 0–40)
Albumin/Globulin Ratio: 1.7 (ref 1.2–2.2)
Albumin: 4.3 g/dL (ref 3.8–4.8)
Alkaline Phosphatase: 76 IU/L (ref 39–117)
BUN/Creatinine Ratio: 23 (ref 9–23)
BUN: 20 mg/dL (ref 6–24)
Bilirubin Total: 0.4 mg/dL (ref 0.0–1.2)
CO2: 21 mmol/L (ref 20–29)
Calcium: 9.4 mg/dL (ref 8.7–10.2)
Chloride: 101 mmol/L (ref 96–106)
Creatinine, Ser: 0.88 mg/dL (ref 0.57–1.00)
GFR calc Af Amer: 90 mL/min/{1.73_m2} (ref >59)
GFR calc non Af Amer: 78 mL/min/{1.73_m2} (ref >59)
Globulin, Total: 2.5 g/dL (ref 1.5–4.5)
Glucose: 185 mg/dL — ABNORMAL HIGH (ref 65–99)
Potassium: 4.3 mmol/L (ref 3.5–5.2)
Sodium: 136 mmol/L (ref 134–144)
Total Protein: 6.8 g/dL (ref 6.0–8.5)

## 2018-12-13 LAB — LIPID PANEL
Chol/HDL Ratio: 3.3 ratio (ref 0.0–4.4)
Cholesterol, Total: 138 mg/dL (ref 100–199)
HDL: 42 mg/dL (ref >39)
LDL Calculated: 59 mg/dL (ref 0–99)
Triglycerides: 186 mg/dL — ABNORMAL HIGH (ref 0–149)
VLDL Cholesterol Cal: 37 mg/dL (ref 5–40)

## 2018-12-13 LAB — TSH: TSH: 4.91 u[IU]/mL — ABNORMAL HIGH (ref 0.450–4.500)

## 2018-12-13 LAB — T4, FREE: Free T4: 1.17 ng/dL (ref 0.82–1.77)

## 2018-12-20 ENCOUNTER — Encounter (HOSPITAL_COMMUNITY): Payer: Self-pay | Admitting: Radiology

## 2019-03-14 ENCOUNTER — Ambulatory Visit (INDEPENDENT_AMBULATORY_CARE_PROVIDER_SITE_OTHER): Payer: BLUE CROSS/BLUE SHIELD | Admitting: Family Medicine

## 2019-03-14 ENCOUNTER — Other Ambulatory Visit: Payer: Self-pay

## 2019-03-14 ENCOUNTER — Encounter: Payer: Self-pay | Admitting: Family Medicine

## 2019-03-14 VITALS — BP 112/78 | HR 84 | Temp 98.5°F | Ht 65.0 in | Wt 226.0 lb

## 2019-03-14 DIAGNOSIS — E1165 Type 2 diabetes mellitus with hyperglycemia: Secondary | ICD-10-CM | POA: Diagnosis not present

## 2019-03-14 DIAGNOSIS — Z114 Encounter for screening for human immunodeficiency virus [HIV]: Secondary | ICD-10-CM | POA: Diagnosis not present

## 2019-03-14 DIAGNOSIS — I10 Essential (primary) hypertension: Secondary | ICD-10-CM

## 2019-03-14 DIAGNOSIS — E782 Mixed hyperlipidemia: Secondary | ICD-10-CM | POA: Diagnosis not present

## 2019-03-14 DIAGNOSIS — Z23 Encounter for immunization: Secondary | ICD-10-CM | POA: Diagnosis not present

## 2019-03-14 MED ORDER — ATORVASTATIN CALCIUM 40 MG PO TABS: 40.0000 mg | ORAL_TABLET | Freq: Every day | ORAL | 3 refills | Status: DC

## 2019-03-14 MED ORDER — METFORMIN HCL 1000 MG PO TABS: 1000.0000 mg | ORAL_TABLET | Freq: Two times a day (BID) | ORAL | 3 refills | Status: DC

## 2019-03-14 MED ORDER — LISINOPRIL-HYDROCHLOROTHIAZIDE 10-12.5 MG PO TABS: 1.0000 | ORAL_TABLET | Freq: Every day | ORAL | 1 refills | Status: DC

## 2019-03-14 MED ORDER — TRULICITY 1.5 MG/0.5ML ~~LOC~~ SOAJ: 1.5000 mg | SUBCUTANEOUS | 5 refills | Status: DC

## 2019-03-14 MED ORDER — BLOOD GLUCOSE MONITOR KIT: PACK | 5 refills | Status: AC

## 2019-03-14 NOTE — Patient Instructions (Addendum)
If you have lab work done today you will be contacted with your lab results within the next 2 weeks.  If you have not heard from [place] then please contact us. The fastest way to get your results is to register for My Chart.   IF you received an x-ray today, you will receive an invoice from [facility]. Please contact [facility] at [phone] with questions or concerns regarding your invoice.   IF you received labwork today, you will receive an invoice from [facility]. Please contact [facility] at [phone] with questions or concerns regarding your invoice.   Our billing staff will not be able to assist you with questions regarding bills from these companies.  You will be contacted with the lab results as soon as they are available. The fastest way to get your results is to activate your My Chart account. Instructions are located on the last page of this paperwork. If you have not heard from [place] regarding the results in 2 weeks, please contact this office.     Recuento de caloras para bajar de peso Calorie Counting for [employer] Loss Las caloras son unidades de Teacher, early years/pre. Su cuerpo necesita una cierta cantidad de caloras de los alimentos para que le ayuden a Company secretary. Cuando come ms caloras de las que el cuerpo necesita, este acumula las caloras extra [place]. Cuando come [employer] de las que el cuerpo [place], este quema grasa para obtener la energa que requiere. El recuento de caloras es el registro de la cantidad de caloras que come y Pharmacologist. El recuento de caloras puede ser de ayuda si necesita perder peso. Si se asegura de comer menos caloras de las que el cuerpo necesita, debe bajar de [place]. Pregntele al mdico cul es un peso sano para usted. Para que el recuento de caloras funcione, tendr que comer la cantidad de caloras adecuadas para usted en un da, para bajar una cantidad de peso saludable por semana. Un nutricionista puede determinar  la cantidad de caloras que necesita por da y sugerirle cmo alcanzar su objetivo calrico.  Una cantidad de peso saludable para bajar por semana suele ser [place] 1 y Ivar Drape (0,5 a 0,9kg). Esto significa con frecuencia que su ingesta diaria de caloras se debera reducir unas 500 a 750caloras.  Ingerir 1200 a 1500caloras por Administrator, Civil Service a la [place] a Administrator, Civil Service.  Ingerir de 1500 a 1800caloras por Administrator, Civil Service a la [employer] de los hombres a Administrator, Civil Service. En qu consiste el plan? Mi objetivo es comer _____1500_____ Raenette Rover da. Si como esta cantidad de caloras por da, debo bajar unas ______1____ Terrall Laity. Qu debo saber acerca del recuento de caloras? A fin de alcanzar su objetivo diario de caloras, tendr que:  Averiguar cuntas caloras hay en cada alimento que le Therapist, occupational. Intente hacerlo antes de comer.  Decida la cantidad que puede comer del alimento.  Anote lo que comi y cuntas caloras tena. Esta tarea se conoce como llevar un registro de comidas. Para perder peso con xito es importante equilibrar el recuento de caloras con un estilo de vida saludable que incluya actividad fsica de forma regular. Tenga un objetivo de 129minutos de ejercicio moderado (como caminar) o 75 minutos de ejercicio vigoroso (como correr) todas las semanas. Dnde encuentro informacin sobre las caloras?  Es posible Animator cantidad de caloras que contiene un alimento en la etiqueta de informacin nutricional. Si un alimento no  tiene una etiqueta de informacin nutricional, intente buscar las caloras en Internet o pida ayuda al nutricionista. Recuerde que las caloras se calculan por porcin. Si opta por comer ms de una porcin de un alimento, tendr [employer] las caloras por porcin por la cantidad de porciones que planea comer. Por ejemplo, la etiqueta de un envase de pan puede decir que el tamao de una porcin es [place], y que  una porcin tiene 90caloras. Si come 1rodaja, habr comido 90caloras. Si come 2rodajas, habr comido 180caloras. Cmo llevo un registro de comidas? Despus de cada comida, registre la siguiente informacin en el registro de comidas:  Lo que comi. No olvide incluir los aderezos, las salsas y otros extras de la comida.  La cantidad que comi. Esto se puede medir en tazas, onzas o cantidad de alimentos.  Cuntas caloras ingiri por comida y por bebida.  La cantidad total de caloras en la comida. Tenga a Materials engineer de comidas, por ejemplo, en un anotador de bolsillo o utilice una aplicacin mvil o sitio web. Algunos programas calcularn las caloras y Automotive engineer la cantidad de caloras que le quedan para llegar al objetivo diario. Cules son algunos consejos para el recuento de caloras?   Use las caloras de los alimentos y las bebidas que lo sacien y no lo dejen con apetito: ? Algunos ejemplos de alimentos que lo sacian son los frutos secos y Engineer, mining de frutos secos, verduras, Advertising account planner y Clinical research associate con alto contenido de Pharmacist, hospital como los cereales integrales. Los alimentos con alto contenido de [place] son aquellos que tienen ms de 5g de fibra por porcin. ? Las [employer] refrescos, especialmente las bebidas a base de caf y los jugos, que contienen muchas caloras, pero no le dan saciedad.  Coma alimentos nutritivos y evite las caloras vacas. Las caloras vacas son aquellas que se obtienen de los alimentos o las bebidas que no contienen muchos nutrientes ni protenas, como los dulces y los refrescos. Es mejor comer una comida nutritiva altamente calrica (como un aguacate) que una con pocos nutrientes (como una bolsa de patatas fritas).  Sepa cuntas caloras tienen los alimentos que come con ms frecuencia. Esto le ayudar a contar las caloras ms rpidamente.  Preste atencin a las [employer]. Las bebidas de bajas caloras incluyen agua y  refrescos sin Location manager.  Preste atencin a las etiquetas nutricionales de alimentos "bajos en grasas" o "sin grasas". Estos alimentos a veces tienen la misma cantidad de caloras o ms caloras que las versiones ricas en grasa. Con frecuencia, tambin tienen agregados de azcar, almidn o sal, para darles el sabor que fue eliminado con la grasa.  Encuentre un mtodo para controlar las caloras que funcione para usted. Sea creativo. Pruebe aplicaciones o programas distintos, si llevar un registro de las caloras no funciona para usted. Cules son algunos consejos para controlar las porciones?  Sepa cuntas caloras hay en una porcin. Esto lo ayudar a saber cuntas porciones de un alimento determinado puede comer.  Use una taza medidora para medir los tamaos de las porciones. Tambin Secondary school teacher las porciones en una balanza de cocina. Con el tiempo, podr hacer un clculo estimativo de los tamaos de las porciones de algunos alimentos.  Dedique tiempo a poner porciones de diferentes alimentos en sus platos, tazones y tazas predilectos, a fin de saber cmo se ve una porcin.  Intente no comer directamente de una bolsa o una caja. Esto puede llevarlo a comer en exceso.  Ponga la cantidad [employer] gustara comer en una taza o un plato, a fin de asegurarse de que est comiendo la porcin correcta.  Use platos, vasos y tazones ms pequeos para no comer en exceso.  Intente no realizar varias tareas al [employer] (como mirar la TV o usar su computadora) [place] come. Si es la hora de comer, sintese a Conservation officer, nature y disfrute de Environmental education officer. Esto lo ayudar a Marine scientist cundo est satisfecho. Tambin le ayudar a tomar conciencia de lo que est comiendo y de la cantidad. Cules son algunos consejos para seguir este plan? Lectura de las etiquetas de los alimentos  Controle el recuento de caloras en comparacin con el tamao de la porcin. El tamao de la porcin puede ser ms pequeo de lo que suele  comer.  Verifique la fuente de las caloras. Asegrese de que la comida que ingiere tenga alto contenido de vitaminas y protenas y sea baja en grasas saturadas y grasas trans. De compras  Lea las etiquetas nutricionales cuando compre. Esto le ayudar a tomar decisiones ms saludables antes de comprar [employer].  Haga una lista para el almacn y resptela. La coccin  Intente cocinar sus alimentos preferidos de una manera ms saludable. Por ejemplo, pruebe hornear en vez de frer.  Utilice productos lcteos descremados. Planificacin de los alimentos  Utilice ms frutas y verduras. La mitad de sus platos debe ser de frutas y verduras.  Incluya protenas [employer]Mc[employer]ee y el pescado. Cmo puedo hacer el recuento de caloras cuando como afuera?  Pida porciones ms pequeas.  Considere la posibilidad de Publishing rights manager un plato principal y las guarniciones, en lugar de pedir su propio plato principal.  Si pide su propio plato principal, coma solo la mitad. Pida una caja al comienzo de la comida y ponga all el resto del plato principal, para no sentir la tentacin de comerlo.  Si se detallan las caloras en el men, elija las opciones que contengan la menor cantidad.  Elija platos que incluyan verduras, frutas, cereales integrales, productos lcteos con bajo contenido de grasa y Advertising account planner.  Opte por los alimentos hervidos, asados, cocidos a la parrilla o al vapor. No coma alimentos que contengan mantequilla, estn empanados, fritos o que se sirvan con salsa a base de crema. Generalmente, los alimentos que se etiquetan como "crujientes" estn fritos, a menos que se indique lo contrario.  Elija el agua, la [place], [place] t helado sin azcar u otras bebidas que no contengan azcares agregados. Si desea una bebida alcohlica, escoja una opcin con menos caloras como una copa de vino o una cerveza ligera.  Ordene los [employer], las salsas y los jarabes aparte. Estos son, con  frecuencia, de alto contenido en caloras, por lo que debe limitar la cantidad que ingiere.  Si desea [place], elija una de hortalizas y pida carnes a la parrilla. Evite las guarniciones adicionales como el tocino, el queso o los alimentos fritos. Ordene el aderezo aparte o pida aceite de [place] y vinagre o limn para Haematologist.  Haga un clculo estimativo de la cantidad de porciones que le sirven. Por ejemplo, una porcin de arroz cocido equivale a media taza o la mitad del tamao de una pelota de bisbol. Conocer el tamao de las porciones lo ayudar a Personnel officer atento a la cantidad de comida que come [employer] Petroleum. La lista que sigue le [place] el tamao de algunas porciones comunes a partir de objetos cotidianos: ? 1onza (28g) = 4dados  apilados. ? 3onzas (85g) = 51mazo de cartas. ? 1cucharadita = 1dado. ? 1cucharada = media pelota de tenis de mesa. ? 2cucharadas = 1pelota de tenis de mesa. ? Media taza = media pelota de bisbol. ? 1taza = 1 pelota de bisbol. Resumen  El recuento de caloras es el registro de la cantidad de caloras que come y Pharmacologist. Si come menos caloras de las que el cuerpo necesita, debe bajar de [place].  Una cantidad de peso saludable para bajar por semana suele ser [place] 1 y Ivar Drape (0,5 a 0,9kg). Esto significa, con frecuencia, reducir su ingesta diaria de caloras unas 500 a 750 caloras.  Es posible Animator cantidad de caloras que contiene un alimento en la etiqueta de informacin nutricional. Si un alimento no tiene una etiqueta de informacin nutricional, intente buscar las caloras en Internet o pida ayuda al nutricionista.  Use las caloras de los alimentos y las bebidas que lo sacien y no de los alimentos y las bebidas que lo dejan con apetito.  Use platos, vasos y tazones ms pequeos para no comer en exceso. Esta informacin no tiene Marine scientist el consejo del mdico. Asegrese de hacerle al mdico cualquier pregunta  que tenga. Document Released: 09/03/2008 Document Revised: 08/17/2016 Document Reviewed: 08/17/2016 Elsevier Patient Education  [place].

## 2019-03-14 NOTE — Progress Notes (Signed)
10/13/20208:19 AM  [place] June 15, 1970, 48 y.o., female [id number]  Chief Complaint  Patient presents with  . Follow-up    chronic conditions, medication refill on all meds including supplies    HPI:   Patient is a 48 y.o. female with past medical history significant for HTN, HLP, DM2, subclinical hypothyroidism who presents today for routine followup  Last OV July 2020 D/c januvia, increased trulicity Increased atorvastatin Tolerating new doses Not checking cbgs at home Denies any polydipsia or polyuria Not following any diet Denies referral to CDE  Lab Results  Component Value Date   HGBA1C 10.8 (A) 12/12/2018   HGBA1C 10.9 (A) 03/22/2018   HGBA1C 7.7 06/18/2017   Lab Results  Component Value Date   MICROALBUR 0.9 07/08/2015   LDLCALC 59 12/12/2018   CREATININE 0.88 12/12/2018    Depression screen PHQ 2/9 03/14/2019 12/12/2018 03/22/2018  Decreased Interest 0 0 0  Down, Depressed, Hopeless 0 0 0  PHQ - 2 Score 0 0 0    Fall Risk  03/14/2019 12/12/2018 03/22/2018 05/28/2017 02/11/2017  Falls in the past year? 0 0 No No Yes  Number falls in past yr: 0 0 - - 2 or more  Injury with Fall? 0 0 - - (No Data)  Comment - - - - body aches     No Known Allergies  Prior to Admission medications   Medication Sig Start Date End Date Taking? Authorizing Provider  atorvastatin (LIPITOR) 40 MG tablet Take 1 tablet (40 mg total) by mouth daily. 12/12/18  Yes Rutherford Guys, MD  blood glucose meter kit and supplies KIT Please check fasting blood sugar in the morning. 09/25/16  Yes Jaynee Eagles, PA-C  Dulaglutide (TRULICITY) 1.5 KM/6.3OT SOPN Inject 1.5 mg into the skin once a week. 12/12/18  Yes Rutherford Guys, MD  lisinopril-hydrochlorothiazide (ZESTORETIC) 10-12.5 MG tablet Take 1 tablet by mouth daily. 12/12/18  Yes Rutherford Guys, MD  metFORMIN (GLUCOPHAGE) 1000 MG tablet Take 1 tablet (1,000 mg total) by mouth 2 (two) times daily with a meal. 12/12/18   Yes Rutherford Guys, MD    Past Medical History:  Diagnosis Date  . Anemia   . Diabetes mellitus without complication ([place])   . Hyperlipidemia   . Hypertension     Past Surgical History:  Procedure Laterality Date  . LAPAROSCOPIC UNILATERAL SALPINGO OOPHERECTOMY      Social History   Tobacco Use  . Smoking status: Never Smoker  . Smokeless tobacco: Never Used  Substance Use Topics  . Alcohol use: No    Alcohol/week: 0.0 standard drinks    Family History  Problem Relation Age of Onset  . Hyperlipidemia Mother   . Hypertension Mother   . Hypertension Father     Review of Systems  Constitutional: Negative for chills and fever.  Respiratory: Negative for cough and shortness of breath.   Cardiovascular: Negative for chest pain, palpitations and leg swelling.  Gastrointestinal: Negative for abdominal pain, nausea and vomiting.     OBJECTIVE:  Today's Vitals   03/14/19 0816  BP: 112/78  Pulse: 84  Temp: 98.5 F (36.9 C)  SpO2: 98%  Weight: 226 lb (102.5 kg)  Height: 5' 5"  (1.651 m)   Body mass index is 37.61 kg/m.  Wt Readings from Last 3 Encounters:  03/14/19 226 lb (102.5 kg)  12/12/18 234 lb (106.1 kg)  03/22/18 217 lb 3.2 oz (98.5 kg)    Physical Exam Vitals signs and nursing note reviewed.  Constitutional:      Appearance: She is well-developed.  HENT:     Head: Normocephalic and atraumatic.     Mouth/Throat:     Pharynx: No oropharyngeal exudate.  Eyes:     General: No scleral icterus.    Conjunctiva/sclera: Conjunctivae normal.     Pupils: Pupils are equal, round, and reactive to light.  Neck:     Musculoskeletal: Neck supple.  Cardiovascular:     Rate and Rhythm: Normal rate and regular rhythm.     Heart sounds: Normal heart sounds. No murmur. No friction rub. No gallop.   Pulmonary:     Effort: Pulmonary effort is normal.     Breath sounds: Normal breath sounds. No wheezing or rales.  Skin:    General: Skin is warm and dry.   Neurological:     Mental Status: She is alert and oriented to person, place, and time.     No results found for this or any previous visit (from the past 24 hour(s)).  No results found.   ASSESSMENT and PLAN  1. Uncontrolled type 2 diabetes mellitus with hyperglycemia (HCC) Non compliant. Checking labs today, medications will be adjusted as needed.  - TSH - Lipid panel - CMP14+EGFR - Hemoglobin A1c  2. Essential hypertension Controlled. Continue current regime.  - TSH - Lipid panel - CMP14+EGFR - lisinopril-hydrochlorothiazide (ZESTORETIC) 10-12.5 MG tablet; Take 1 tablet by mouth daily.  3. Mixed hyperlipidemia Checking labs today, medications will be adjusted as needed.  - TSH - Lipid panel - CMP14+EGFR - atorvastatin (LIPITOR) 40 MG tablet; Take 1 tablet (40 mg total) by mouth daily.  4. Screening for HIV (human immunodeficiency virus) - HIV Antibody (routine testing w rflx)  5. Need for prophylactic vaccination and inoculation against influenza - Flu Vaccine QUAD 36+ mos IM  Other orders - Dulaglutide (TRULICITY) 1.5 MM/3.8TR SOPN; Inject 1.5 mg into the skin once a week. - metFORMIN (GLUCOPHAGE) 1000 MG tablet; Take 1 tablet (1,000 mg total) by mouth 2 (two) times daily with a meal. - blood glucose meter kit and supplies KIT; Per insurance preference. Check blood glucose once a day. E11.9  Return in about 3 months (around 06/14/2019).    Rutherford Guys, MD Primary Care at [place], [place] [address] Ph.  [phone] Fax [phone]

## 2019-03-15 LAB — CMP14+EGFR
ALT: 23 IU/L (ref 0–32)
AST: 22 IU/L (ref 0–40)
Albumin/Globulin Ratio: 1.6 (ref 1.2–2.2)
Albumin: 4.4 g/dL (ref 3.8–4.8)
Alkaline Phosphatase: 80 IU/L (ref 39–117)
BUN/Creatinine Ratio: 21 (ref 9–23)
BUN: 20 mg/dL (ref 6–24)
Bilirubin Total: 0.5 mg/dL (ref 0.0–1.2)
CO2: 20 mmol/L (ref 20–29)
Calcium: 9.5 mg/dL (ref 8.7–10.2)
Chloride: 99 mmol/L (ref 96–106)
Creatinine, Ser: 0.96 mg/dL (ref 0.57–1.00)
GFR calc Af Amer: 81 mL/min/{1.73_m2} (ref >59)
GFR calc non Af Amer: 70 mL/min/{1.73_m2} (ref >59)
Globulin, Total: 2.8 g/dL (ref 1.5–4.5)
Glucose: 187 mg/dL — ABNORMAL HIGH (ref 65–99)
Potassium: 4.5 mmol/L (ref 3.5–5.2)
Sodium: 137 mmol/L (ref 134–144)
Total Protein: 7.2 g/dL (ref 6.0–8.5)

## 2019-03-15 LAB — HEMOGLOBIN A1C
Est. average glucose Bld gHb Est-mCnc: 226 mg/dL
Hgb A1c MFr Bld: 9.5 % — ABNORMAL HIGH (ref 4.8–5.6)

## 2019-03-15 LAB — LIPID PANEL
Chol/HDL Ratio: 3.2 ratio (ref 0.0–4.4)
Cholesterol, Total: 126 mg/dL (ref 100–199)
HDL: 40 mg/dL (ref >39)
LDL Chol Calc (NIH): 55 mg/dL (ref 0–99)
Triglycerides: 189 mg/dL — ABNORMAL HIGH (ref 0–149)
VLDL Cholesterol Cal: 31 mg/dL (ref 5–40)

## 2019-03-15 LAB — TSH: TSH: 5.52 u[IU]/mL — ABNORMAL HIGH (ref 0.450–4.500)

## 2019-03-15 LAB — HIV ANTIBODY (ROUTINE TESTING W REFLEX): HIV Screen 4th Generation wRfx: NONREACTIVE

## 2019-06-13 ENCOUNTER — Telehealth: Payer: Self-pay | Admitting: Family Medicine

## 2019-06-13 NOTE — Telephone Encounter (Signed)
Pt called to see if she could get a refill of her insulin to get her to her appointment if possible   Please advise

## 2019-06-15 ENCOUNTER — Ambulatory Visit: Payer: BLUE CROSS/BLUE SHIELD | Admitting: Family Medicine

## 2019-06-15 NOTE — Telephone Encounter (Signed)
Spoke with interpreter Clide Dales [phone] to speak with pt. Pt's VM was not set up so I will speak with the provider to make sure it is time for refill. Will try to call pt back later.

## 2019-06-16 ENCOUNTER — Encounter: Payer: Self-pay | Admitting: Family Medicine

## 2019-11-28 ENCOUNTER — Other Ambulatory Visit: Payer: Self-pay

## 2019-11-28 ENCOUNTER — Encounter: Payer: Self-pay | Admitting: Family Medicine

## 2019-11-28 ENCOUNTER — Telehealth (INDEPENDENT_AMBULATORY_CARE_PROVIDER_SITE_OTHER): Payer: Self-pay | Admitting: Family Medicine

## 2019-11-28 DIAGNOSIS — E782 Mixed hyperlipidemia: Secondary | ICD-10-CM

## 2019-11-28 DIAGNOSIS — E1165 Type 2 diabetes mellitus with hyperglycemia: Secondary | ICD-10-CM

## 2019-11-28 DIAGNOSIS — I10 Essential (primary) hypertension: Secondary | ICD-10-CM

## 2019-11-28 MED ORDER — ATORVASTATIN CALCIUM 40 MG PO TABS: 40.0000 mg | ORAL_TABLET | Freq: Every day | ORAL | 0 refills | Status: DC

## 2019-11-28 MED ORDER — GLIPIZIDE 10 MG PO TABS: 10.0000 mg | ORAL_TABLET | Freq: Two times a day (BID) | ORAL | 0 refills | Status: DC

## 2019-11-28 MED ORDER — LISINOPRIL-HYDROCHLOROTHIAZIDE 10-12.5 MG PO TABS: 1.0000 | ORAL_TABLET | Freq: Every day | ORAL | 0 refills | Status: DC

## 2019-11-28 MED ORDER — METFORMIN HCL 1000 MG PO TABS: 1000.0000 mg | ORAL_TABLET | Freq: Two times a day (BID) | ORAL | 0 refills | Status: DC

## 2019-11-28 NOTE — Progress Notes (Signed)
Virtual Visit Note  I connected with patient on 11/28/19 at 628pm by phone due to technical difficulties and verified that I am speaking with the correct person using two identifiers. Jackie Wells is currently located at home and patient is currently with them during visit. The provider, Rutherford Guys, MD is located in their office at time of visit.  I discussed the limitations, risks, security and privacy concerns of performing an evaluation and management service by telephone and the availability of in person appointments. I also discussed with the patient that there may be a patient responsible charge related to this service. The patient expressed understanding and agreed to proceed.   I provided 12 minutes of non-face-to-face time during this encounter.  Chief Complaint  Patient presents with  . Diabetes    no medications for 6 + months     HPI ? PMH: HTN, HLP, DM2, subclinical hypothyroidism  Last OV Oct 2020 Has not been able to followup with [place] as she had problems with her insurance, affordability Unable to sign up at work until open enrollment in October She has been checking cbgs at home, low 300s Does not check BP Has been wo medications for about 6 months  Lab Results  Component Value Date   HGBA1C 9.5 (H) 03/14/2019   HGBA1C 10.8 (A) 12/12/2018   HGBA1C 10.9 (A) 03/22/2018   Lab Results  Component Value Date   MICROALBUR 0.9 07/08/2015   [place] 55 03/14/2019   CREATININE 0.96 03/14/2019    No Known Allergies  Prior to Admission medications   Medication Sig Start Date End Date Taking? Authorizing Provider  atorvastatin (LIPITOR) 40 MG tablet Take 1 tablet (40 mg total) by mouth daily. Patient not taking: Reported on 11/28/2019 03/14/19   Rutherford Guys, MD  blood glucose meter kit and supplies KIT Per insurance preference. Check blood glucose once a day. E11.9 Patient not taking: Reported on 11/28/2019 03/14/19   Rutherford Guys, MD    Dulaglutide (TRULICITY) 1.5 WY/6.3ZC SOPN Inject 1.5 mg into the skin once a week. Patient not taking: Reported on 11/28/2019 03/14/19   Rutherford Guys, MD  lisinopril-hydrochlorothiazide (ZESTORETIC) 10-12.5 MG tablet Take 1 tablet by mouth daily. Patient not taking: Reported on 11/28/2019 03/14/19   Rutherford Guys, MD  metFORMIN (GLUCOPHAGE) 1000 MG tablet Take 1 tablet (1,000 mg total) by mouth 2 (two) times daily with a meal. Patient not taking: Reported on 11/28/2019 03/14/19   Rutherford Guys, MD    Past Medical History:  Diagnosis Date  . Anemia   . Diabetes mellitus without complication ([place])   . Hyperlipidemia   . Hypertension     Past Surgical History:  Procedure Laterality Date  . LAPAROSCOPIC UNILATERAL SALPINGO OOPHERECTOMY      Social History   Tobacco Use  . Smoking status: Never Smoker  . Smokeless tobacco: Never Used  Substance Use Topics  . Alcohol use: No    Alcohol/week: 0.0 standard drinks    Family History  Problem Relation Age of Onset  . Hyperlipidemia Mother   . Hypertension Mother   . Hypertension Father     Review of Systems  Constitutional: Negative for chills and fever.  Eyes: Negative for blurred vision.  Respiratory: Negative for cough and shortness of breath.   Cardiovascular: Negative for chest pain, palpitations and leg swelling.  Gastrointestinal: Negative for abdominal pain, nausea and vomiting.  Genitourinary: Negative for frequency and urgency.  Neurological: Negative for dizziness and  headaches.  Endo/Heme/Allergies: Negative for polydipsia.     Objective  Vitals as reported by the patient: none   ASSESSMENT and PLAN  1. Mixed hyperlipidemia - atorvastatin (LIPITOR) 40 MG tablet; Take 1 tablet (40 mg total) by mouth daily.  2. Essential hypertension - lisinopril-hydrochlorothiazide (ZESTORETIC) 10-12.5 MG tablet; Take 1 tablet by mouth daily.  3. Uncontrolled type 2 diabetes mellitus with hyperglycemia ([place]) -  Basic Metabolic Panel; Future - Hemoglobin A1c; Future  Restarting meds. Changing trulicity to glipizide as she does not have insurance. 30 days given, needs to be seen in person prior to labs   Other orders - metFORMIN (GLUCOPHAGE) 1000 MG tablet; Take 1 tablet (1,000 mg total) by mouth 2 (two) times daily with a meal. - glipiZIDE (GLUCOTROL) 10 MG tablet; Take 1 tablet (10 mg total) by mouth 2 (two) times daily before a meal.  FOLLOW-UP: before 4 weeks   The above assessment and management plan was discussed with the patient. The patient verbalized understanding of and has agreed to the management plan. Patient is aware to call the clinic if symptoms persist or worsen. Patient is aware when to return to the clinic for a follow-up visit. Patient educated on when it is appropriate to go to the emergency department.     Rutherford Guys, MD Primary Care at [place], [place] [address] Ph.  [phone] Fax [phone]

## 2019-11-28 NOTE — Patient Instructions (Signed)
° ° ° °  If you have lab work done today you will be contacted with your lab results within the next 2 weeks.  If you have not heard from us then please contact us. The fastest way to get your results is to register for My Chart. ° ° °IF you received an x-ray today, you will receive an invoice from [facility]. Please contact [facility] at [phone] with questions or concerns regarding your invoice.  ° °IF you received labwork today, you will receive an invoice from [facility]. Please contact [facility] at [phone] with questions or concerns regarding your invoice.  ° °Our billing staff will not be able to assist you with questions regarding bills from these companies. ° °You will be contacted with the lab results as soon as they are available. The fastest way to get your results is to activate your My Chart account. Instructions are located on the last page of this paperwork. If you have not heard from us regarding the results in 2 weeks, please contact this office. °  ° ° ° °

## 2019-12-14 ENCOUNTER — Encounter: Payer: Self-pay | Admitting: Family Medicine

## 2019-12-14 ENCOUNTER — Other Ambulatory Visit: Payer: Self-pay

## 2019-12-14 ENCOUNTER — Ambulatory Visit (INDEPENDENT_AMBULATORY_CARE_PROVIDER_SITE_OTHER): Payer: Self-pay | Admitting: Family Medicine

## 2019-12-14 VITALS — BP 123/83 | HR 85 | Temp 97.6°F | Ht 64.5 in | Wt 223.4 lb

## 2019-12-14 DIAGNOSIS — E1165 Type 2 diabetes mellitus with hyperglycemia: Secondary | ICD-10-CM

## 2019-12-14 DIAGNOSIS — I10 Essential (primary) hypertension: Secondary | ICD-10-CM

## 2019-12-14 LAB — BASIC METABOLIC PANEL
BUN/Creatinine Ratio: 21 (ref 9–23)
BUN: 16 mg/dL (ref 6–24)
CO2: 20 mmol/L (ref 20–29)
Calcium: 9.5 mg/dL (ref 8.7–10.2)
Chloride: 103 mmol/L (ref 96–106)
Creatinine, Ser: 0.77 mg/dL (ref 0.57–1.00)
GFR calc Af Amer: 105 mL/min/{1.73_m2} (ref >59)
GFR calc non Af Amer: 91 mL/min/{1.73_m2} (ref >59)
Glucose: 175 mg/dL — ABNORMAL HIGH (ref 65–99)
Potassium: 4.5 mmol/L (ref 3.5–5.2)
Sodium: 139 mmol/L (ref 134–144)

## 2019-12-14 LAB — HEMOGLOBIN A1C
Est. average glucose Bld gHb Est-mCnc: 315 mg/dL
Hgb A1c MFr Bld: 12.6 % — ABNORMAL HIGH (ref 4.8–5.6)

## 2019-12-14 NOTE — Progress Notes (Signed)
7/15/20219:47 AM  [place] [birth year], 49 y.o., female [id number]  Chief Complaint  Patient presents with  . Follow-up    diabetes    HPI:   Patient is a 49 y.o. female with past medical history significant for HTN, HLP, DM2, subclinical hypothyroidism who presents today for followup  Last OV 2 weeks ago, restarted meds (atorvastain, lisinopril-hctz, metformin, glipizide) Checking fasting cbgs, coming down from 300s Today fasting 192 Has been working on her diet, has cut back on bread and soda, snacking  She reports that her vision has remained with blurry vision, if she uses her glasses she sees fine, sees optho for routine visit in nov 2021 She feels she has been losing weight, her pants are loser No increased thirst or urination Denies any sx hypoglycemia  Depression screen [facility] 2/9 12/14/2019 11/28/2019 03/14/2019  Decreased Interest 0 0 0  Down, Depressed, Hopeless 0 0 0  PHQ - 2 Score 0 0 0    Fall Risk  12/14/2019 11/28/2019 03/14/2019 12/12/2018 03/22/2018  Falls in the past year? 0 0 0 0 No  Number falls in past yr: 0 0 0 0 -  Injury with Fall? 0 0 0 0 -  Comment - - - - -  Follow up Falls evaluation completed Falls evaluation completed - - -     No Known Allergies  Prior to Admission medications   Medication Sig Start Date End Date Taking? Authorizing Provider  atorvastatin (LIPITOR) 40 MG tablet Take 1 tablet (40 mg total) by mouth daily. 11/28/19  Yes Rutherford Guys, MD  blood glucose meter kit and supplies KIT Per insurance preference. Check blood glucose once a day. E11.9 03/14/19  Yes Rutherford Guys, MD  glipiZIDE (GLUCOTROL) 10 MG tablet Take 1 tablet (10 mg total) by mouth 2 (two) times daily before a meal. 11/28/19  Yes Rutherford Guys, MD  lisinopril-hydrochlorothiazide (ZESTORETIC) 10-12.5 MG tablet Take 1 tablet by mouth daily. 11/28/19  Yes Rutherford Guys, MD  metFORMIN (GLUCOPHAGE) 1000 MG tablet Take 1 tablet (1,000 mg total) by  mouth 2 (two) times daily with a meal. 11/28/19  Yes Rutherford Guys, MD    Past Medical History:  Diagnosis Date  . Anemia   . Diabetes mellitus without complication ([place])   . Hyperlipidemia   . Hypertension     Past Surgical History:  Procedure Laterality Date  . LAPAROSCOPIC UNILATERAL SALPINGO OOPHERECTOMY      Social History   Tobacco Use  . Smoking status: Never Smoker  . Smokeless tobacco: Never Used  Substance Use Topics  . Alcohol use: No    Alcohol/week: 0.0 standard drinks    Family History  Problem Relation Age of Onset  . Hyperlipidemia Mother   . Hypertension Mother   . Hypertension Father     Review of Systems  Constitutional: Negative for chills and fever.  Respiratory: Negative for cough and shortness of breath.   Cardiovascular: Negative for chest pain, palpitations and leg swelling.  Gastrointestinal: Negative for abdominal pain, nausea and vomiting.     OBJECTIVE:  Today's Vitals   12/14/19 0940  BP: 123/83  Pulse: 85  Temp: 97.6 F (36.4 C)  TempSrc: Temporal  SpO2: 96%  Weight: 223 lb 6.4 oz (101.3 kg)  Height: 5' 4.5" (1.638 m)   Body mass index is 37.75 kg/m.   Wt Readings from Last 3 Encounters:  12/14/19 223 lb 6.4 oz (101.3 kg)  03/14/19 226 lb (102.5 kg)  12/12/18 234 lb (106.1 kg)       Physical Exam Vitals and nursing note reviewed.  Constitutional:      Appearance: She is well-developed.  HENT:     Head: Normocephalic and atraumatic.     Mouth/Throat:     Pharynx: No oropharyngeal exudate.  Eyes:     General: No scleral icterus.    Conjunctiva/sclera: Conjunctivae normal.     Pupils: Pupils are equal, round, and reactive to light.  Cardiovascular:     Rate and Rhythm: Normal rate and regular rhythm.     Heart sounds: Normal heart sounds. No murmur heard.  No friction rub. No gallop.   Pulmonary:     Effort: Pulmonary effort is normal.     Breath sounds: Normal breath sounds. No wheezing or rales.    Musculoskeletal:     Cervical back: Neck supple.  Skin:    General: Skin is warm and dry.  Neurological:     Mental Status: She is alert and oriented to person, place, and time.     Diabetic Foot Form - Detailed   Diabetic Foot Exam - detailed Diabetic Foot exam was performed with the following findings: Yes 12/14/2019  9:46 AM  Visual Foot Exam completed.: Yes  Can the patient see the bottom of their feet?: Yes Are the shoes appropriate in style and fit?: Yes Is there swelling or and abnormal foot shape?: No Is there a claw toe deformity?: No Is there elevated skin temparature?: No Is there foot or ankle muscle weakness?: No Normal Range of Motion: Yes Pulse Foot Exam completed.: Yes  Right posterior Tibialias: Present   Right Dorsalis Pedis: Present   Sensory Foot Exam Completed.: Yes Semmes-Weinstein Monofilament Test       No results found for this or any previous visit (from the past 24 hour(s)).  No results found.   ASSESSMENT and PLAN  1. Uncontrolled type 2 diabetes mellitus with hyperglycemia (HCC) Resumed meds. cbgs slowly coming down. Discussed importance of LFM, weight loss. Gets labs today for new baseline. Treatment limited due to no insurance, consider actos if next A1c not at goal.  - HM DIABETES FOOT EXAM - Microalbumin / creatinine urine ratio - Hemoglobin F0Y - Basic Metabolic Panel  2. Essential hypertension Controlled. Continue current regime.   Return in about 3 months (around 03/15/2020).    Rutherford Guys, MD [facility], [place] [address] Ph.  [phone] Fax [phone]

## 2019-12-14 NOTE — Patient Instructions (Addendum)
   If you have lab work done today you will be contacted with your lab results within the next 2 weeks.  If you have not heard from us then please contact us. The fastest way to get your results is to register for My Chart.   IF you received an x-ray today, you will receive an invoice from [facility]. Please contact [facility] at [phone] with questions or concerns regarding your invoice.   IF you received labwork today, you will receive an invoice from [facility]. Please contact [facility] at [phone] with questions or concerns regarding your invoice.   Our billing staff will not be able to assist you with questions regarding bills from these companies.  You will be contacted with the lab results as soon as they are available. The fastest way to get your results is to activate your My Chart account. Instructions are located on the last page of this paperwork. If you have not heard from us regarding the results in 2 weeks, please contact this office.     Diabetes mellitus y nutricin, en adultos Diabetes Mellitus and Nutrition, Adult Si sufre de diabetes (diabetes mellitus), es muy importante tener hbitos alimenticios saludables debido a que sus niveles de azcar en la sangre (glucosa) se ven afectados en gran medida por lo que come y bebe. Comer alimentos saludables en las cantidades adecuadas, aproximadamente a la misma hora todos los das, lo ayudar a:  Controlar la glucemia.  Disminuir el riesgo de sufrir una enfermedad cardaca.  Mejorar la presin arterial.  Alcanzar o mantener un peso saludable. Todas las personas que sufren de diabetes son diferentes y cada una tiene necesidades diferentes en cuanto a un plan de alimentacin. El mdico puede recomendarle que trabaje con un especialista en dietas y nutricin (nutricionista) para elaborar el mejor plan para usted. Su plan de alimentacin puede variar segn factores como:  Las caloras que  necesita.  Los medicamentos que toma.  Su peso.  Sus niveles de glucemia, presin arterial y colesterol.  Su nivel de actividad.  Otras afecciones que tenga, como enfermedades cardacas o renales. Cmo me afectan los carbohidratos? Los carbohidratos, o hidratos de carbono, afectan su nivel de glucemia ms que cualquier otro tipo de alimento. La ingesta de carbohidratos naturalmente aumenta la cantidad de glucosa en la sangre. El recuento de carbohidratos es un mtodo destinado a llevar un registro de la cantidad de carbohidratos que se consumen. El recuento de carbohidratos es importante para mantener la glucemia a un nivel saludable, especialmente si utiliza insulina o toma determinados medicamentos por va oral para la diabetes. Es importante conocer la cantidad de carbohidratos que se pueden ingerir en cada comida sin correr ningn riesgo. Esto es diferente en cada persona. Su nutricionista puede ayudarlo a calcular la cantidad de carbohidratos que debe ingerir en cada comida y en cada refrigerio. Entre los alimentos que contienen carbohidratos, se incluyen:  Pan, cereal, arroz, pastas y galletas.  Papas y maz.  Guisantes, frijoles y lentejas.  Leche y yogur.  Frutas y jugo.  Postres, como pasteles, galletas, helado y caramelos. Cmo me afecta el alcohol? El alcohol puede provocar disminuciones sbitas de la glucemia (hipoglucemia), especialmente si utiliza insulina o toma determinados medicamentos por va oral para la diabetes. La hipoglucemia es una afeccin potencialmente mortal. Los sntomas de la hipoglucemia (somnolencia, mareos y confusin) son similares a los sntomas de haber consumido demasiado alcohol. Si el mdico afirma que el alcohol es seguro para usted, siga estas pautas:    Limite el consumo de alcohol a no ms de 1medida por da si es mujer y no est embarazada, y a 2medidas si es hombre. Una medida equivale a 12oz (355ml) de cerveza, 5oz (148ml) de vino o  1oz (44ml) de bebidas alcohlicas de alta graduacin.  No beba con el estmago vaco.  Mantngase hidratado bebiendo agua, refrescos dietticos o t helado sin azcar.  Tenga en cuenta que los refrescos comunes, los jugos y otras bebida para mezclar pueden contener mucha azcar y se deben contar como carbohidratos. Cules son algunos consejos para seguir este plan?  Leer las etiquetas de los alimentos  Comience por leer el tamao de la porcin en la "Informacin nutricional" en las etiquetas de los alimentos envasados y las bebidas. La cantidad de caloras, carbohidratos, grasas y otros nutrientes mencionados en la etiqueta se basan en una porcin del alimento. Muchos alimentos contienen ms de una porcin por envase.  Verifique la cantidad total de gramos (g) de carbohidratos totales en una porcin. Puede calcular la cantidad de porciones de carbohidratos al dividir el total de carbohidratos por 15. Por ejemplo, si un alimento tiene un total de 30g de carbohidratos, equivale a 2 porciones de carbohidratos.  Verifique la cantidad de gramos (g) de grasas saturadas y grasas trans en una porcin. Escoja alimentos que no contengan grasa o que tengan un bajo contenido.  Verifique la cantidad de miligramos (mg) de sal (sodio) en una porcin. La mayora de las personas deben limitar la ingesta de sodio total a menos de 2300mg por da.  Siempre consulte la informacin nutricional de los alimentos etiquetados como "con bajo contenido de grasa" o "sin grasa". Estos alimentos pueden tener un mayor contenido de azcar agregada o carbohidratos refinados, y deben evitarse.  Hable con su nutricionista para identificar sus objetivos diarios en cuanto a los nutrientes mencionados en la etiqueta. Al ir de compras  Evite comprar alimentos procesados, enlatados o precocinados. Estos alimentos tienden a tener una mayor cantidad de grasa, sodio y azcar agregada.  Compre en la zona exterior de la tienda de  comestibles. Esta zona incluye frutas y verduras frescas, granos a granel, carnes frescas y productos lcteos frescos. Al cocinar  Utilice mtodos de coccin a baja temperatura, como hornear, en lugar de mtodos de coccin a alta temperatura, como frer en abundante aceite.  Cocine con aceites saludables, como el aceite de oliva, canola o girasol.  Evite cocinar con manteca, crema o carnes con alto contenido de grasa. Planificacin de las comidas  Coma las comidas y los refrigerios regularmente, preferentemente a la misma hora todos los das. Evite pasar largos perodos de tiempo sin comer.  Consuma alimentos ricos en fibra, como frutas frescas, verduras, frijoles y cereales integrales. Consulte a su nutricionista sobre cuntas porciones de carbohidratos puede consumir en cada comida.  Consuma entre 4 y 6 onzas (oz) de protenas magras por da, como carnes magras, pollo, pescado, huevos o tofu. Una onza de protena magra equivale a: ? 1 onza de carne, pollo o pescado. ? 1huevo. ?  taza de tofu.  Coma algunos alimentos por da que contengan grasas saludables, como aguacates, frutos secos, semillas y pescado. Estilo de vida  Controle su nivel de glucemia con regularidad.  Haga actividad fsica habitualmente como se lo haya indicado el mdico. Esto puede incluir lo siguiente: ? 150minutos semanales de ejercicio de intensidad moderada o alta. Esto podra incluir caminatas dinmicas, ciclismo o gimnasia acutica. ? Realizar ejercicios de elongacin y de fortalecimiento, como yoga o levantamiento   de pesas, por lo menos 2veces por semana.  Tome los medicamentos como se lo haya indicado el mdico.  No consuma ningn producto que contenga nicotina o tabaco, como cigarrillos y cigarrillos electrnicos. Si necesita ayuda para dejar de fumar, consulte al mdico.  Trabaje con un asesor o instructor en diabetes para identificar estrategias para controlar el estrs y cualquier desafo emocional  y social. Preguntas para hacerle al mdico  Es necesario que consulte a un instructor en el cuidado de la diabetes?  Es necesario que me rena con un nutricionista?  A qu nmero puedo llamar si tengo preguntas?  Cules son los mejores momentos para controlar la glucemia? Dnde encontrar ms informacin:  Asociacin Estadounidense de la Diabetes (American Diabetes Association): diabetes.org  Academia de Nutricin y Diettica (Academy of Nutrition and Dietetics): www.eatright.org  Instituto Nacional de la Diabetes y las Enfermedades Digestivas y Renales (National Institute of Diabetes and Digestive and Kidney Diseases, NIH): www.niddk.nih.gov Resumen  Un plan de alimentacin saludable lo ayudar a controlar la glucemia y mantener un estilo de vida saludable.  Trabajar con un especialista en dietas y nutricin (nutricionista) puede ayudarlo a elaborar el mejor plan de alimentacin para usted.  Tenga en cuenta que los carbohidratos (hidratos de carbono) y el alcohol tienen efectos inmediatos en sus niveles de glucemia. Es importante contar los carbohidratos que ingiere y consumir alcohol con prudencia. Esta informacin no tiene como fin reemplazar el consejo del mdico. Asegrese de hacerle al mdico cualquier pregunta que tenga. Document Revised: 01/26/2017 Document Reviewed: 09/07/2016 Elsevier Patient Education  2020 Elsevier Inc.  

## 2019-12-15 LAB — MICROALBUMIN / CREATININE URINE RATIO
Creatinine, Urine: 77 mg/dL
Microalb/Creat Ratio: 14 mg/g creat (ref 0–29)
Microalbumin, Urine: 10.5 ug/mL

## 2020-01-01 ENCOUNTER — Telehealth: Payer: Self-pay | Admitting: Family Medicine

## 2020-01-01 ENCOUNTER — Other Ambulatory Visit: Payer: Self-pay | Admitting: Family Medicine

## 2020-01-01 DIAGNOSIS — E782 Mixed hyperlipidemia: Secondary | ICD-10-CM

## 2020-01-01 DIAGNOSIS — I10 Essential (primary) hypertension: Secondary | ICD-10-CM

## 2020-01-01 NOTE — Telephone Encounter (Signed)
Pt is looking for the followin 4 meds   What is the name of the medication? atorvastatin (LIPITOR) 40 MG tablet [[id number] glipiZIDE (GLUCOTROL) 10 MG tablet [[id number] lisinopril-hydrochlorothiazide (ZESTORETIC) 10-12.5 MG tablet [[id number]]  metFORMIN (GLUCOPHAGE) 1000 MG tablet        Have you contacted your pharmacy to request a refill? y   Which pharmacy would you like this sent to?  [facility] [address] [place] - [address]  [address] [place] [address]  Phone:  [phone] Fax:  [phone]  DEA #:  --  Patient notified that their request is being sent to the clinical staff for review and that they should receive a call once it is complete. If they do not receive a call within 72 hours they can check with their pharmacy or our office.    Please resend

## 2020-01-02 NOTE — Telephone Encounter (Signed)
Patient informed her refills have been authorized and sent to [facility].

## 2020-03-15 ENCOUNTER — Other Ambulatory Visit: Payer: Self-pay

## 2020-03-15 ENCOUNTER — Telehealth: Payer: Self-pay | Admitting: Emergency Medicine

## 2020-03-15 DIAGNOSIS — I10 Essential (primary) hypertension: Secondary | ICD-10-CM

## 2020-03-15 DIAGNOSIS — E782 Mixed hyperlipidemia: Secondary | ICD-10-CM

## 2020-03-15 MED ORDER — METFORMIN HCL 1000 MG PO TABS: ORAL_TABLET | ORAL | 0 refills | Status: DC

## 2020-03-15 MED ORDER — LISINOPRIL-HYDROCHLOROTHIAZIDE 10-12.5 MG PO TABS: 1.0000 | ORAL_TABLET | Freq: Every day | ORAL | 0 refills | Status: DC

## 2020-03-15 MED ORDER — ATORVASTATIN CALCIUM 40 MG PO TABS: 40.0000 mg | ORAL_TABLET | Freq: Every day | ORAL | 0 refills | Status: DC

## 2020-03-15 MED ORDER — GLIPIZIDE 10 MG PO TABS: ORAL_TABLET | ORAL | 0 refills | Status: DC

## 2020-03-15 NOTE — Telephone Encounter (Signed)
completed

## 2020-03-15 NOTE — Telephone Encounter (Signed)
Pt has a toc scheduled with dr. Mitchel Honour for 04/22/2020 \  May need courtesy refills on  the following  atorvastatin (LIPITOR) 40 MG tablet [[id number] metFORMIN (GLUCOPHAGE) 1000 MG tablet  lisinopril-hydrochlorothiazide (ZESTORETIC) 10-12.5 MG tablet [[id number]]  glipiZIDE (GLUCOTROL) 10 MG tablet [    What is the name of the medication?  Have you contacted your pharmacy to request a refill? n  Which pharmacy would you like this sent to? [facility] [address] [place] - [address]  [address], [place] [address]  Phone:  [phone] Fax:  [phone]  DEA #:  --   Patient notified that their request is being sent to the clinical staff for review and that they should receive a call once it is complete. If they do not receive a call within 72 hours they can check with their pharmacy or our office.

## 2020-03-26 ENCOUNTER — Other Ambulatory Visit: Payer: Self-pay

## 2020-03-26 ENCOUNTER — Encounter: Payer: Self-pay | Admitting: Podiatry

## 2020-03-26 ENCOUNTER — Ambulatory Visit (INDEPENDENT_AMBULATORY_CARE_PROVIDER_SITE_OTHER): Payer: Self-pay

## 2020-03-26 ENCOUNTER — Ambulatory Visit (INDEPENDENT_AMBULATORY_CARE_PROVIDER_SITE_OTHER): Payer: Self-pay | Admitting: Podiatry

## 2020-03-26 DIAGNOSIS — M7750 Other enthesopathy of unspecified foot: Secondary | ICD-10-CM

## 2020-03-26 DIAGNOSIS — M722 Plantar fascial fibromatosis: Secondary | ICD-10-CM

## 2020-03-26 DIAGNOSIS — M79671 Pain in right foot: Secondary | ICD-10-CM

## 2020-03-26 MED ORDER — MELOXICAM 15 MG PO TABS
15.0000 mg | ORAL_TABLET | Freq: Every day | ORAL | 0 refills | Status: DC
Start: 2020-03-26 — End: 2020-10-21

## 2020-03-26 NOTE — Patient Instructions (Signed)
Fascitis plantar, rehabilitacin Plantar Fasciitis Rehab Pregunte al mdico qu ejercicios son seguros para usted. Haga los ejercicios exactamente como se lo haya indicado el mdico y gradelos como se lo hayan indicado. Es normal sentir un estiramiento leve, tironeo, opresin o Tree surgeon al [employer] [place]. Detngase de inmediato si siente un dolor repentino o Printmaker. No comience a hacer estos ejercicios hasta que se lo indique el mdico. Ejercicios de elongacin y amplitud de movimiento Estos ejercicios calientan los msculos y las articulaciones, y mejoran la movilidad y la flexibilidad del pie. Adems, ayudan a Best boy. Estiramiento de la fascia plantar  1. Sintese con la pierna izquierda/derecha cruzada sobre la rodilla opuesta. 2. Sostenga el taln con [place], con el pulgar cerca del arco. Con la otra mano, sostenga los dedos de los pies y empjelos con [place]. Debe sentir un estiramiento en la parte de abajo de los dedos o del pie (fascia plantar), o de ambos. 3. Mantenga esta posicin durante _________ segundos. 4. Afloje lentamente los dedos y vuelva a la posicin inicial. Repita __________ veces. Realice este ejercicio __________ veces al da. Estiramiento de los gemelos, de pie Este ejercicio tambin se denomina estiramiento de la pantorrilla (los msculos gemelos). Estira los msculos posteriores de la parte superior de la pantorrilla. 1. Prese con las manos [employer] pared. 2. Extienda la pierna izquierda/derecha hacia atrs y flexione ligeramente la rodilla de la pierna de adelante. 3. Mantenga los talones apoyados en el suelo y la rodilla de atrs extendida, y lleve el peso hacia la pared. No arquee la espalda. Debe sentir un estiramiento suave en la parte superior de la pantorrilla izquierda/derecha. 4. Mantenga esta posicin durante __________ segundos. Repita __________ veces. Realice este ejercicio __________ veces al  da. Estiramiento del msculo sleo, de pie Este ejercicio tambin se denomina estiramiento de la pantorrilla (sleo). Estira los msculos posteriores de la parte inferior de la pantorrilla. 1. Prese con las manos [employer] pared. 2. Extienda la pierna izquierda/derecha hacia atrs y flexione ligeramente la rodilla de la pierna de adelante. 3. Mantenga los talones apoyados en el suelo, flexione la rodilla de atrs y lleve el peso ligeramente a la pierna de atrs. Debe sentir un estiramiento suave en la parte profunda de la parte inferior de la pantorrilla. 4. Mantenga esta posicin durante __________ segundos. Repita __________ veces. Realice este ejercicio __________ veces al da. Estiramiento de los [employer] gemelos y sleo, de pie con un escaln Este ejercicio estira los msculos posteriores de la parte inferior de la pierna. Estos msculos se encuentran en la parte superior de la pantorrilla (gastrocnemio) y la parte inferior de la pantorrilla (sleo). 1. Prese sobre un escaln apoyando solo la regin metatarsiana de su pie derecho/izquierdo. La regin metatarsiana del pie es la superficie sobre la que caminamos, justo debajo de los dedos. 2. Mantenga el otro pie apoyado con firmeza en el mismo escaln. 3. Sostngase de la pared o de una baranda para mantener el equilibrio. 4. [place] lentamente el otro pie y deje que el peso del cuerpo presione el taln izquierdo/derecho sobre el borde del escaln. Debe sentir un estiramiento en la pantorrilla izquierda/derecha. 5. Mantenga esta posicin durante __________ segundos. 6. Vuelva a poner ambos pies sobre el escaln. 7. Repita este ejercicio con una leve flexin en la rodilla izquierda/derecha. Reptalo __________ veces con la rodilla izquierda/derecha extendida y __________ veces con la rodilla izquierda/derecha flexionada. Realice este ejercicio __________ veces al da. Ejercicio de  equilibrio Este ejercicio aumenta el equilibrio y el  control de la fuerza del arco, para ayudar a reducir la presin sobre la fascia plantar. Pararse sobre una pierna Si este ejercicio es muy fcil, puede intentar hacerlo con los ojos cerrados o parado sobre [place]. 1. Sin calzado, prese cerca de una baranda o [place]. Puede sostenerse de la baranda o del marco de la puerta, segn lo necesite. 2. Prese sobre el pie izquierdo/derecho. Sin despegar el dedo gordo del suelo, intente mantener el arco levantado. No deje que el pie se vaya hacia adentro. 3. Mantenga esta posicin durante __________ segundos. Repita __________ veces. Realice este ejercicio __________ veces al da. Esta informacin no tiene Marine scientist el consejo del mdico. Asegrese de hacerle al mdico cualquier pregunta que tenga. Document Revised: 04/20/2018 Document Reviewed: 04/20/2018 Elsevier Patient Education  2020 [employer].

## 2020-03-27 NOTE — Progress Notes (Signed)
Subjective:   Patient ID: Jackie Wells, female   DOB: 49 y.o.   MRN: [id number]   HPI 49 year old female presents the office today for concerns of heel pain which is been ongoing for the last 6 weeks 12 weeks on the right side.  She states that she has pain only when she first gets up or if she could not move on until the bathroom but the pain has become more consistent.  She denies any recent injury or trauma denies any swelling.  She had no recent treatment for this.  She is diabetic and last A1c was 12.6.   Review of Systems  All other systems reviewed and are negative.  Past Medical History:  Diagnosis Date  . Anemia   . Diabetes mellitus without complication ([place])   . Hyperlipidemia   . Hypertension     Past Surgical History:  Procedure Laterality Date  . LAPAROSCOPIC UNILATERAL SALPINGO OOPHERECTOMY       Current Outpatient Medications:  .  atorvastatin (LIPITOR) 40 MG tablet, Take 1 tablet (40 mg total) by mouth daily., Disp: 90 tablet, Rfl: 0 .  blood glucose meter kit and supplies KIT, Per insurance preference. Check blood glucose once a day. E11.9, Disp: 1 each, Rfl: 5 .  glipiZIDE (GLUCOTROL) 10 MG tablet, TAKE ONE TABLET BY MOUTH TWICE A DAY BEFORE A MEAL, Disp: 180 tablet, Rfl: 0 .  lisinopril-hydrochlorothiazide (ZESTORETIC) 10-12.5 MG tablet, Take 1 tablet by mouth daily., Disp: 90 tablet, Rfl: 0 .  meloxicam (MOBIC) 15 MG tablet, Take 1 tablet (15 mg total) by mouth daily., Disp: 30 tablet, Rfl: 0 .  metFORMIN (GLUCOPHAGE) 1000 MG tablet, TAKE ONE TABLET BY MOUTH TWICE A DAY WITH A MEAL, Disp: 180 tablet, Rfl: 0  No Known Allergies       Objective:  Physical Exam  General: AAO x3, NAD  Dermatological: Skin is warm, dry and supple bilateral.  There are no open sores, no preulcerative lesions, no rash or signs of infection present.  Vascular: Dorsalis Pedis artery and Posterior Tibial artery pedal pulses are 2/4 bilateral with immedate capillary  fill time.There is no pain with calf compression, swelling, warmth, erythema.   Neruologic: Grossly intact via light touch bilateral.  Negative Tinel sign.  Musculoskeletal: Tenderness to palpation along the plantar medial tubercle of the calcaneus at the insertion of plantar fascia on the right foot. There is no pain along the course of the plantar fascia within the arch of the foot. Plantar fascia appears to be intact. There is no pain with lateral compression of the calcaneus or pain with vibratory sensation. There is no pain along the course or insertion of the achilles tendon. No other areas of tenderness to bilateral lower extremities. Muscular strength 5/5 in all groups tested bilateral.  Gait: Unassisted, Nonantalgic.       Assessment:   49 year old female right heel pain, plantar fasciitis    Plan:  -Treatment options discussed including all alternatives, risks, and complications -Etiology of symptoms were discussed -X-rays were obtained and reviewed with the patient.  No evidence of acute fracture or stress fracture identified today. -Given her increased A1c I held off on a steroid injection.  However her symptoms continue we will consider doing this. -Prescribed mobic. Discussed side effects of the medication and directed to stop if any are to occur and call the office.  -Stretching, icing daily. -Discussed shoe modifications and orthotics.  I dispensed a plantar fascial brace today.  No follow-ups on file.  Trula Slade DPM

## 2020-04-03 ENCOUNTER — Other Ambulatory Visit: Payer: Self-pay

## 2020-04-03 ENCOUNTER — Telehealth: Payer: Self-pay | Admitting: General Practice

## 2020-04-03 DIAGNOSIS — E1165 Type 2 diabetes mellitus with hyperglycemia: Secondary | ICD-10-CM

## 2020-04-03 DIAGNOSIS — E782 Mixed hyperlipidemia: Secondary | ICD-10-CM

## 2020-04-03 DIAGNOSIS — I10 Essential (primary) hypertension: Secondary | ICD-10-CM

## 2020-04-03 MED ORDER — ATORVASTATIN CALCIUM 40 MG PO TABS: 40.0000 mg | ORAL_TABLET | Freq: Every day | ORAL | 0 refills | Status: DC

## 2020-04-03 MED ORDER — LISINOPRIL-HYDROCHLOROTHIAZIDE 10-12.5 MG PO TABS: 1.0000 | ORAL_TABLET | Freq: Every day | ORAL | 0 refills | Status: DC

## 2020-04-03 MED ORDER — GLIPIZIDE 10 MG PO TABS: ORAL_TABLET | ORAL | 0 refills | Status: DC

## 2020-04-03 MED ORDER — METFORMIN HCL 1000 MG PO TABS: ORAL_TABLET | ORAL | 0 refills | Status: DC

## 2020-04-03 NOTE — Telephone Encounter (Signed)
Received a fax after hours about pt need a med refill. Rx listed below. Pt has toc appt with Dr. Mitchel Honour on 04/22/20. She states she only has enough medication   metFORMIN (GLUCOPHAGE) 1000 MG tablet [[id number]]   glipiZIDE (GLUCOTROL) 10 MG tablet [[id number]]  atorvastatin (LIPITOR) 40 MG tablet [[id number]]   lisinopril-hydrochlorothiazide (ZESTORETIC) 10-12.5 MG tablet [[id number]]

## 2020-04-03 NOTE — Telephone Encounter (Signed)
Prescriptions were resent to the pharmacy as prescribed. LVM to inform pt script were resent.

## 2020-04-03 NOTE — Telephone Encounter (Signed)
04/03/2020 - PATIENT IS CALLING TO SAY SHE HAS NEVER GOTTEN HER MEDICATIONS FROM 2 WEEKS AGO. NOW SHE IS COMPLETELY OUT. IT LOOKS LIKE M[facility] COMPLETED IT ON 03/15/20, BUT IT SAYS VERIFICATION STATUS NOT AVAILABLE FOR THIS ORDER. PLEASE CALL PATIENT TO LET HER KNOW THAT IT HAS BEEN SENT TO HER PHARMACY TODAY. BEST PHONE FOR PATIENT IS [phone]   PHARMACY CHOICE IS [facility] ON [address]. [place]

## 2020-04-22 ENCOUNTER — Other Ambulatory Visit: Payer: Self-pay

## 2020-04-22 ENCOUNTER — Ambulatory Visit (INDEPENDENT_AMBULATORY_CARE_PROVIDER_SITE_OTHER): Payer: Self-pay | Admitting: Emergency Medicine

## 2020-04-22 ENCOUNTER — Encounter: Payer: Self-pay | Admitting: Emergency Medicine

## 2020-04-22 VITALS — BP 119/83 | HR 88 | Temp 98.0°F | Resp 16 | Ht 64.5 in | Wt 236.2 lb

## 2020-04-22 DIAGNOSIS — E1159 Type 2 diabetes mellitus with other circulatory complications: Secondary | ICD-10-CM

## 2020-04-22 DIAGNOSIS — I152 Hypertension secondary to endocrine disorders: Secondary | ICD-10-CM

## 2020-04-22 DIAGNOSIS — Z6839 Body mass index (BMI) 39.0-39.9, adult: Secondary | ICD-10-CM

## 2020-04-22 DIAGNOSIS — E1165 Type 2 diabetes mellitus with hyperglycemia: Secondary | ICD-10-CM

## 2020-04-22 DIAGNOSIS — E785 Hyperlipidemia, unspecified: Secondary | ICD-10-CM

## 2020-04-22 DIAGNOSIS — E1169 Type 2 diabetes mellitus with other specified complication: Secondary | ICD-10-CM

## 2020-04-22 DIAGNOSIS — Z23 Encounter for immunization: Secondary | ICD-10-CM

## 2020-04-22 LAB — POCT GLYCOSYLATED HEMOGLOBIN (HGB A1C): Hemoglobin A1C: 9.2 % — AB (ref 4.0–5.6)

## 2020-04-22 LAB — GLUCOSE, POCT (MANUAL RESULT ENTRY): POC Glucose: 220 mg/dl — AB (ref 70–99)

## 2020-04-22 NOTE — Patient Instructions (Addendum)
If you have lab work done today you will be contacted with your lab results within the next 2 weeks.  If you have not heard from [place] then please contact us. The fastest way to get your results is to register for My Chart.   IF you received an x-ray today, you will receive an invoice from [facility]. Please contact [facility] at [phone] with questions or concerns regarding your invoice.   IF you received labwork today, you will receive an invoice from [place]. Please contact [facility] at [phone] with questions or concerns regarding your invoice.   Our billing staff will not be able to assist you with questions regarding bills from these companies.  You will be contacted with the lab results as soon as they are available. The fastest way to get your results is to activate your My Chart account. Instructions are located on the last page of this paperwork. If you have not heard from [place] regarding the results in 2 weeks, please contact this office.     Diabetes mellitus y nutricin, en adultos Diabetes Mellitus and Nutrition, Adult Si sufre de diabetes (diabetes mellitus), es muy importante tener hbitos alimenticios saludables debido a que sus niveles de Designer, television/film set sangre (glucosa) se ven afectados en gran medida por lo que come y bebe. [place] alimentos saludables en las cantidades [place], aproximadamente a la [employer], [place] ayudar a:  Aeronautical engineer glucemia.  Disminuir el riesgo de sufrir una enfermedad cardaca.  Mejorar la presin arterial.  Science writer o mantener un peso saludable. Todas las personas que sufren de diabetes son diferentes y cada una tiene necesidades diferentes en cuanto a un plan de alimentacin. El mdico puede recomendarle que trabaje con un especialista en dietas y nutricin (nutricionista) para Financial trader plan para usted. Su plan de alimentacin puede variar segn factores como:  Las caloras que  necesita.  Los medicamentos que toma.  Su peso.  Sus niveles de glucemia, presin arterial y colesterol.  Su nivel de [place].  Otras afecciones que tenga, como enfermedades cardacas o renales. Cmo me afectan los carbohidratos? Los carbohidratos, o hidratos de carbono, afectan su nivel de glucemia ms que cualquier otro tipo de alimento. La ingesta de carbohidratos naturalmente aumenta la cantidad de [employer]. El recuento de carbohidratos es un mtodo destinado a Catering manager un registro de la cantidad de carbohidratos que se consumen. El recuento de carbohidratos es importante para Theatre manager la glucemia a un nivel saludable, especialmente si utiliza insulina o toma determinados medicamentos por va oral para la diabetes. Es importante conocer la cantidad de carbohidratos que se pueden ingerir en cada comida sin correr Engineer, manufacturing. Esto es Psychologist, forensic. Su nutricionista puede ayudarlo a calcular la cantidad de carbohidratos que debe ingerir en cada comida y en cada refrigerio. Entre los alimentos que contienen carbohidratos, se incluyen:  Pan, cereal, arroz, pastas y galletas.  Papas y maz.  Guisantes, frijoles y lentejas.  Leche y Estate agent.  Lambert Mody y [place].  Postres, como pasteles, galletas, helado y caramelos. Cmo me afecta el alcohol? El alcohol puede provocar disminuciones sbitas de la glucemia (hipoglucemia), especialmente si utiliza insulina o toma determinados medicamentos por va oral para la diabetes. La hipoglucemia es una afeccin potencialmente mortal. Los sntomas de la hipoglucemia (somnolencia, mareos y confusin) son similares a los sntomas de haber consumido demasiado alcohol. Si el mdico afirma que el alcohol es seguro para usted, [place] estas pautas:  Limite el consumo de alcohol a no ms de 66medida por da si es mujer y no est [place], y a 68medidas si es hombre. Una medida equivale a 12oz (342ml) de cerveza, 5oz (159ml) de vino o  1oz (65ml) de bebidas alcohlicas de alta graduacin.  No beba con el estmago vaco.  Mantngase hidratado bebiendo agua, refrescos dietticos o t helado sin azcar.  Tenga en cuenta que los refrescos comunes, los jugos y otras bebida para Optician, dispensing pueden contener mucha azcar y se deben contar como carbohidratos. Cules son algunos consejos para seguir este plan?  Leer las etiquetas de los alimentos  Comience por leer el tamao de la porcin en la "Informacin nutricional" en las etiquetas de los alimentos envasados y las bebidas. La cantidad de caloras, carbohidratos, grasas y otros nutrientes mencionados en la etiqueta se basan en una porcin del alimento. Muchos alimentos contienen ms de una porcin por envase.  Verifique la cantidad total de gramos (g) de carbohidratos totales en una porcin. Puede calcular la cantidad de porciones de carbohidratos al dividir el total de carbohidratos por 15. Por ejemplo, si un alimento tiene un total de 30g de carbohidratos, equivale a 2 porciones de carbohidratos.  Verifique la cantidad de gramos (g) de grasas saturadas y grasas trans en una porcin. Escoja alimentos que no contengan grasa o que tengan un bajo contenido.  Verifique la cantidad de miligramos (mg) de sal (sodio) en una porcin. La mayora de las personas deben limitar la ingesta de sodio total a menos de 2300mg  por Training and development officer.  Siempre consulte la informacin nutricional de los alimentos etiquetados como "con bajo contenido de grasa" o "sin grasa". Estos alimentos pueden tener un mayor contenido de Location manager agregada o carbohidratos refinados, y deben evitarse.  Hable con su nutricionista para identificar sus objetivos diarios en cuanto a los nutrientes mencionados en la etiqueta. Al ir de compras  Evite comprar alimentos procesados, enlatados o precocinados. Estos alimentos tienden a Special educational needs teacher mayor cantidad de [place], sodio y azcar agregada.  Compre en la zona exterior de la tienda de  comestibles. Esta zona incluye frutas y verduras frescas, granos a granel, carnes frescas y productos lcteos frescos. Al cocinar  Utilice mtodos de coccin a baja temperatura, como hornear, en lugar de mtodos de coccin a alta temperatura, como frer en abundante aceite.  Cocine con aceites saludables, como el aceite de [place], [place] o [place].  Evite cocinar con manteca, crema o carnes con alto contenido de grasa. Planificacin de las comidas  Coma las comidas y los refrigerios regularmente, preferentemente a la misma hora todos [place]. Evite pasar largos perodos de tiempo sin comer.  Consuma alimentos ricos en fibra, como frutas frescas, verduras, frijoles y cereales integrales. Consulte a su nutricionista sobre cuntas porciones de carbohidratos puede consumir en cada comida.  Consuma entre 4 y 6 onzas (oz) de protenas magras por da, como carnes [place], pollo, pescado, huevos o tofu. Una onza de protena magra equivale a: ? 1 onza de carne, pollo o pescado. ? 1huevo. ?  taza de tofu.  Coma algunos alimentos por da que contengan grasas saludables, como aguacates, frutos secos, semillas y pescado. Estilo de vida  Controle su nivel de glucemia con regularidad.  Haga actividad fsica habitualmente como se lo haya indicado el mdico. Esto puede incluir lo siguiente: ? 130minutos semanales de ejercicio de intensidad moderada o alta. Esto podra incluir caminatas dinmicas, ciclismo o gimnasia acutica. ? Realizar ejercicios de elongacin y de fortalecimiento, como yoga o levantamiento  de pesas, por lo menos 2veces por semana.  Tome los [employer] se lo haya indicado el mdico.  No consuma ningn producto que contenga nicotina o tabaco, como cigarrillos y Psychologist, sport and exercise. Si necesita ayuda para dejar de fumar, consulte al [employer] con un asesor o instructor en diabetes para identificar estrategias para controlar el estrs y cualquier desafo emocional  y social. Preguntas para hacerle al mdico  Es necesario que consulte a Radio broadcast assistant en el cuidado de la diabetes?  Es necesario que me rena con un nutricionista?  A qu nmero puedo llamar si tengo preguntas?  Cules son los mejores momentos para controlar la glucemia? Dnde encontrar ms informacin:  Asociacin Estadounidense de la Diabetes (American Diabetes Association): diabetes.org  Academia de Nutricin y Information systems manager (Academy of Nutrition and Dietetics): www.eatright.[place] Diabetes y las Enfermedades Digestivas y [place] [facility] of Diabetes and Digestive and Kidney Diseases, NIH): [employer].dk Resumen  Un plan de alimentacin saludable lo ayudar a Aeronautical engineer glucemia y Theatre manager un estilo de vida saludable.  Trabajar con un especialista en dietas y nutricin (nutricionista) puede ayudarlo a Insurance claims handler de alimentacin para usted.  Tenga en cuenta que los carbohidratos (hidratos de carbono) y el alcohol tienen efectos inmediatos en sus niveles de glucemia. Es importante contar los carbohidratos que ingiere y consumir alcohol con prudencia. Esta informacin no tiene Marine scientist el consejo del mdico. Asegrese de hacerle al mdico cualquier pregunta que tenga. Document Revised: 01/26/2017 Document Reviewed: 09/07/2016 Elsevier Patient Education  [place].

## 2020-04-22 NOTE — Assessment & Plan Note (Signed)
Well-controlled hypertension.  Continue present medications Zestoretic. Better controlled diabetes with improved hemoglobin A1c at 9.2.  Continue Metformin and glipizide. Diet and nutrition discussed. Continue statin therapy with Lipitor 40 mg daily. Follow-up in 3 months.

## 2020-04-22 NOTE — Progress Notes (Signed)
Jackie Wells 49 y.o.   Chief Complaint  Patient presents with   Establish Care    pt here for [facility] today, doing well     HISTORY OF PRESENT ILLNESS: This is a 49 y.o. female here to establish care with me.  Used to see Dr. Pamella Pert. History of diabetes presently on Metformin and glipizide. Lab Results  Component Value Date   HGBA1C 12.6 (H) 12/14/2019  Also has history of hypertension on Zestoretic 10-12.5 mg daily. On statin therapy with atorvastatin 40 mg daily. No other complaints or medical concerns today. Fully vaccinated against Covid.   HPI   Prior to Admission medications   Medication Sig Start Date End Date Taking? Authorizing Provider  atorvastatin (LIPITOR) 40 MG tablet Take 1 tablet (40 mg total) by mouth daily. 04/03/20  Yes Wendie Agreste, MD  blood glucose meter kit and supplies KIT Per insurance preference. Check blood glucose once a day. E11.9 03/14/19  Yes Rutherford Guys, MD  glipiZIDE (GLUCOTROL) 10 MG tablet TAKE ONE TABLET BY MOUTH TWICE A DAY BEFORE A MEAL 04/03/20  Yes Wendie Agreste, MD  lisinopril-hydrochlorothiazide (ZESTORETIC) 10-12.5 MG tablet Take 1 tablet by mouth daily. 04/03/20  Yes Wendie Agreste, MD  meloxicam (MOBIC) 15 MG tablet Take 1 tablet (15 mg total) by mouth daily. 03/26/20 03/26/21 Yes Trula Slade, DPM  metFORMIN (GLUCOPHAGE) 1000 MG tablet TAKE ONE TABLET BY MOUTH TWICE A DAY WITH A MEAL 04/03/20  Yes Wendie Agreste, MD    No Known Allergies  Patient Active Problem List   Diagnosis Date Noted   Varicose veins of left lower extremity with complications 27/78/2423   Left shoulder pain 01/04/2015   Diabetes type 2, uncontrolled ([place]) 11/09/2014   Obesity 08/04/2014   Essential hypertension 08/04/2014    Past Medical History:  Diagnosis Date   Anemia    Diabetes mellitus without complication ([place])    Hyperlipidemia    Hypertension     Past Surgical History:  Procedure Laterality  Date   LAPAROSCOPIC UNILATERAL SALPINGO OOPHERECTOMY      Social History   Socioeconomic History   Marital status: Single    Spouse name: Not on file   Number of children: Not on file   Years of education: Not on file   Highest education level: Not on file  Occupational History   Not on file  Tobacco Use   Smoking status: Never Smoker   Smokeless tobacco: Never Used  Substance and Sexual Activity   Alcohol use: No    Alcohol/week: 0.0 standard drinks   Drug use: No   Sexual activity: Yes  Other Topics Concern   Not on file  Social History Narrative   Not on file   Social Determinants of Health   Financial Resource Strain:    Difficulty of Paying Living Expenses: Not on file  Food Insecurity:    Worried About [place] in the Last Year: Not on file   Ran Out of Food in the Last Year: Not on file  Transportation Needs:    Lack of Transportation (Medical): Not on file   Lack of Transportation (Non-Medical): Not on file  Physical Activity:    Days of Exercise per Week: Not on file   Minutes of Exercise per Session: Not on file  Stress:    Feeling of Stress : Not on file  Social Connections:    Frequency of Communication with Friends and Family: Not on file  Frequency of Social Gatherings with Friends and Family: Not on file   Attends Religious Services: Not on file   Active Member of Clubs or Organizations: Not on file   Attends Archivist Meetings: Not on file   Marital Status: Not on file  Intimate Partner Violence:    Fear of Current or Ex-Partner: Not on file   Emotionally Abused: Not on file   Physically Abused: Not on file   Sexually Abused: Not on file    Family History  Problem Relation Age of Onset   Hyperlipidemia Mother    Hypertension Mother    Hypertension Father      Review of Systems  Constitutional: Negative.  Negative for chills and fever.  HENT: Negative.  Negative for congestion and  sore throat.   Respiratory: Negative.  Negative for cough and shortness of breath.   Cardiovascular: Negative.  Negative for chest pain and palpitations.  Gastrointestinal: Negative.  Negative for abdominal pain, nausea and vomiting.  Genitourinary: Negative.  Negative for dysuria and hematuria.  Skin: Negative.  Negative for rash.  Neurological: Negative.  Negative for dizziness and headaches.  All other systems reviewed and are negative.   Today's Vitals   04/22/20 1554  BP: 119/83  Pulse: 88  Resp: 16  Temp: 98 F (36.7 C)  TempSrc: Temporal  SpO2: 97%  Weight: 236 lb 3.2 oz (107.1 kg)  Height: 5' 4.5" (1.638 m)   Body mass index is 39.92 kg/m. Wt Readings from Last 3 Encounters:  04/22/20 236 lb 3.2 oz (107.1 kg)  12/14/19 223 lb 6.4 oz (101.3 kg)  03/14/19 226 lb (102.5 kg)    Physical Exam Vitals reviewed.  Constitutional:      Appearance: Normal appearance. She is obese.  HENT:     Head: Normocephalic.  Eyes:     Extraocular Movements: Extraocular movements intact.     Conjunctiva/sclera: Conjunctivae normal.     Pupils: Pupils are equal, round, and reactive to light.  Cardiovascular:     Rate and Rhythm: Normal rate and regular rhythm.     Pulses: Normal pulses.     Heart sounds: Normal heart sounds.  Pulmonary:     Effort: Pulmonary effort is normal.     Breath sounds: Normal breath sounds.  Abdominal:     General: There is no distension.     Palpations: Abdomen is soft.     Tenderness: There is no abdominal tenderness.  Musculoskeletal:        General: Normal range of motion.     Cervical back: Normal range of motion and neck supple. No tenderness.  Lymphadenopathy:     Cervical: No cervical adenopathy.  Skin:    General: Skin is warm and dry.     Capillary Refill: Capillary refill takes less than 2 seconds.  Neurological:     General: No focal deficit present.     Mental Status: She is alert and oriented to person, place, and time.    Psychiatric:        Mood and Affect: Mood normal.        Behavior: Behavior normal.    Results for orders placed or performed in visit on 04/22/20 (from the past 24 hour(s))  POCT glucose (manual entry)     Status: Abnormal   Collection Time: 04/22/20  4:49 PM  Result Value Ref Range   POC Glucose 220 (A) 70 - 99 mg/dl  POCT glycosylated hemoglobin (Hb A1C)     Status: Abnormal  Collection Time: 04/22/20  4:52 PM  Result Value Ref Range   Hemoglobin A1C 9.2 (A) 4.0 - 5.6 %   HbA1c POC (<> result, manual entry)     HbA1c, POC (prediabetic range)     HbA1c, POC (controlled diabetic range)       ASSESSMENT & PLAN: Hypertension associated with diabetes ([place]) Well-controlled hypertension.  Continue present medications Zestoretic. Better controlled diabetes with improved hemoglobin A1c at 9.2.  Continue Metformin and glipizide. Diet and nutrition discussed. Continue statin therapy with Lipitor 40 mg daily. Follow-up in 3 months.  Tyreka was seen today for establish care.  Diagnoses and all orders for this visit:  Uncontrolled type 2 diabetes mellitus with hyperglycemia (HCC) -     POCT glucose (manual entry) -     POCT glycosylated hemoglobin (Hb A1C)  Body mass index (BMI) of 39.0-39.9 in adult  Dyslipidemia associated with type 2 diabetes mellitus ([place])  Hypertension associated with diabetes ([place])  Other orders -     Flu Vaccine QUAD 6+ mos PF IM (Fluarix Quad PF)    Patient Instructions       If you have lab work done today you will be contacted with your lab results within the next 2 weeks.  If you have not heard from [place] then please contact us. The fastest way to get your results is to register for My Chart.   IF you received an x-ray today, you will receive an invoice from [facility]. Please contact [facility] at [phone] with questions or concerns regarding your invoice.   IF you received labwork today, you will receive an  invoice from [facility]. Please contact [facility] at [phone] with questions or concerns regarding your invoice.   Our billing staff will not be able to assist you with questions regarding bills from these companies.  You will be contacted with the lab results as soon as they are available. The fastest way to get your results is to activate your My Chart account. Instructions are located on the last page of this paperwork. If you have not heard from [place] regarding the results in 2 weeks, please contact this office.     Diabetes mellitus y nutricin, en adultos Diabetes Mellitus and Nutrition, Adult Si sufre de diabetes (diabetes mellitus), es muy importante tener hbitos alimenticios saludables debido a que sus niveles de Designer, television/film set sangre (glucosa) se ven afectados en gran medida por lo que come y bebe. [place] alimentos saludables en las cantidades [place], aproximadamente a la [employer], [place] ayudar a:  Aeronautical engineer glucemia.  Disminuir el riesgo de sufrir una enfermedad cardaca.  Mejorar la presin arterial.  Science writer o mantener un peso saludable. Todas las personas que sufren de diabetes son diferentes y cada una tiene necesidades diferentes en cuanto a un plan de alimentacin. El mdico puede recomendarle que trabaje con un especialista en dietas y nutricin (nutricionista) para Financial trader plan para usted. Su plan de alimentacin puede variar segn factores como:  Las caloras que necesita.  Los medicamentos que toma.  Su peso.  Sus niveles de glucemia, presin arterial y colesterol.  Su nivel de [place].  Otras afecciones que tenga, como enfermedades cardacas o renales. Cmo me afectan los carbohidratos? Los carbohidratos, o hidratos de carbono, afectan su nivel de glucemia ms que cualquier otro tipo de alimento. La ingesta de carbohidratos naturalmente aumenta la cantidad de [employer]. El recuento de carbohidratos es un mtodo destinado  a Catering manager  un registro de la cantidad de carbohidratos que se consumen. El recuento de carbohidratos es importante para Theatre manager la glucemia a un nivel saludable, especialmente si utiliza insulina o toma determinados medicamentos por va oral para la diabetes. Es importante conocer la cantidad de carbohidratos que se pueden ingerir en cada comida sin correr Engineer, manufacturing. Esto es Psychologist, forensic. Su nutricionista puede ayudarlo a calcular la cantidad de carbohidratos que debe ingerir en cada comida y en cada refrigerio. Entre los alimentos que contienen carbohidratos, se incluyen:  Pan, cereal, arroz, pastas y galletas.  Papas y maz.  Guisantes, frijoles y lentejas.  Leche y Estate agent.  Lambert Mody y [place].  Postres, como pasteles, galletas, helado y caramelos. Cmo me afecta el alcohol? El alcohol puede provocar disminuciones sbitas de la glucemia (hipoglucemia), especialmente si utiliza insulina o toma determinados medicamentos por va oral para la diabetes. La hipoglucemia es una afeccin potencialmente mortal. Los sntomas de la hipoglucemia (somnolencia, mareos y confusin) son similares a los sntomas de haber consumido demasiado alcohol. Si el mdico afirma que el alcohol es seguro para usted, [place] estas pautas:  Limite el consumo de alcohol a no ms de 77mdida por da si es mujer y no est eColo y a 244midas si es hombre. Una medida equivale a 12oz (35531mde cerveza, 5oz (148m71me vino o 1oz (44ml47m bebidas alcohlicas de alta graduacin.  No beba con el estmago vaco.  Mantngase hidratado bebiendo agua, refrescos dietticos o t helado sin azcar.  Tenga en cuenta que los refrescos comunes, los jugos y otras bebida para mezclOptician, dispensingen contener mucha azcar y se deben contar como carbohidratos. Cules son algunos consejos para seguir este plan?  Leer las etiquetas de los alimentos  Comience por leer el tamao de la porcin en la Informacin nutricional  en las etiquetas de los alimentos envasados y las bebidas. La cantidad de caloras, carbohidratos, grasas y otros nutrientes mencionados en la etiqueta se basan en una porcin del alimento. Muchos alimentos contienen ms de una porcin por envase.  Verifique la cantidad total de gramos (g) de carbohidratos totales en una porcin. Puede calcular la cantidad de porciones de carbohidratos al dividir el total de carbohidratos por 15. Por ejemplo, si un alimento tiene un total de 30g de carbohidratos, equivale a 2 porciones de carbohidratos.  Verifique la cantidad de gramos (g) de grasas saturadas y grasas trans en una porcin. Escoja alimentos que no contengan grasa o que tengan un bajo contenido.  Verifique la cantidad de miligramos (mg) de sal (sodio) en una porcin. La mayorState Farmas personas deben limitar la ingesta de sodio total a menos de 2300mg 10mda.  STraining and development officermpre consulte la informacin nutricional de los alimentos etiquetados como con bajo contenido de grasa [place] grasa. Estos alimentos pueden tener un mayor contenido de azcar Location managerada o carbohidratos refinados, y deben evitarse.  Hable con su nutricionista para identificar sus objetivos diarios en cuanto a los nutrientes mencionados en la etiqueta. Al ir de compras  Evite comprar alimentos procesados, enlatados o precocinados. Estos alimentos tienden a tener Special educational needs teacher cantidad de grasa,[place] y azcar agregada.  Compre en la zona exterior de la tienda de comestibles. Esta zona incluye frutas y verduras frescas, granos a granel, carnes frescas y productos lcteos frescos. Al cocinar  Utilice mtodos de coccin a baja temperatura, como hornear, en lugar de mtodos de coccin a alta temperatura, como frer en abundante aceite.  Cocine con aceites saludables, como el aceite de oliva,[place]  canola o girasol.  Evite cocinar con manteca, crema o carnes con alto contenido de grasa. Planificacin de las comidas  Coma las comidas y los  refrigerios regularmente, preferentemente a la misma hora todos [place]. Evite pasar largos perodos de tiempo sin comer.  Consuma alimentos ricos en fibra, como frutas frescas, verduras, frijoles y cereales integrales. Consulte a su nutricionista sobre cuntas porciones de carbohidratos puede consumir en cada comida.  Consuma entre 4 y 6 onzas (oz) de protenas magras por da, como carnes [place], pollo, pescado, huevos o tofu. Una onza de protena magra equivale a: ? 1 onza de carne, pollo o pescado. ? 1huevo. ?  taza de tofu.  Coma algunos alimentos por da que contengan grasas saludables, como aguacates, frutos secos, semillas y pescado. Estilo de vida  Controle su nivel de glucemia con regularidad.  Haga actividad fsica habitualmente como se lo haya indicado el mdico. Esto puede incluir lo siguiente: ? 141mnutos semanales de ejercicio de intensidad moderada o alta. Esto podra incluir caminatas dinmicas, ciclismo o gimnasia acutica. ? Realizar ejercicios de elongacin y de fortalecimiento, como yoga o levantamiento de pesas, por lo menos 2veces por semana.  Tome los mTenneco Incse lo haya indicado el mdico.  No consuma ningn producto que contenga nicotina o tabaco, como cigarrillos y cPsychologist, sport and exercise Si necesita ayuda para dejar de fumar, consulte al mHess Corporationcon un asesor o instructor en diabetes para identificar estrategias para controlar el estrs y cualquier desafo emocional y social. Preguntas para hacerle al mdico  Es necesario que consulte a uRadio broadcast assistanten el cuidado de la diabetes?  Es necesario que me rena con un nutricionista?  A qu nmero puedo llamar si tengo preguntas?  Cules son los mejores momentos para controlar la glucemia? Dnde encontrar ms informacin:  Asociacin Estadounidense de la Diabetes (American Diabetes Association): diabetes.org  Academia de Nutricin y DInformation systems manager(Academy of Nutrition and Dietetics):  www.eatright.oRosmanDiabetes y las Enfermedades Digestivas y Renales ([facility] Surgery Center LLCof Diabetes and Digestive and Kidney Diseases, NIH): wDesMoinesFuneral.dkResumen  Un plan de alimentacin saludable lo ayudar a cAeronautical engineerglucemia y mTheatre managerun estilo de vida saludable.  Trabajar con un especialista en dietas y nutricin (nutricionista) puede ayudarlo a eInsurance claims handlerde alimentacin para usted.  Tenga en cuenta que los carbohidratos (hidratos de carbono) y el alcohol tienen efectos inmediatos en sus niveles de glucemia. Es importante contar los carbohidratos que ingiere y consumir alcohol con prudencia. Esta informacin no tiene cMarine scientistel consejo del mdico. Asegrese de hacerle al mdico cualquier pregunta que tenga. Document Revised: 01/26/2017 Document Reviewed: 09/07/2016 Elsevier Patient Education  2020 Elsevier Inc.      MAgustina Caroli MD Urgent [facility]ay[facility] CorneGroup

## 2020-06-22 ENCOUNTER — Other Ambulatory Visit: Payer: Self-pay | Admitting: Family Medicine

## 2020-06-22 DIAGNOSIS — E782 Mixed hyperlipidemia: Secondary | ICD-10-CM

## 2020-07-01 ENCOUNTER — Other Ambulatory Visit: Payer: Self-pay | Admitting: Emergency Medicine

## 2020-07-01 DIAGNOSIS — I10 Essential (primary) hypertension: Secondary | ICD-10-CM

## 2020-07-01 DIAGNOSIS — E1165 Type 2 diabetes mellitus with hyperglycemia: Secondary | ICD-10-CM

## 2020-07-01 DIAGNOSIS — E782 Mixed hyperlipidemia: Secondary | ICD-10-CM

## 2020-07-01 NOTE — Telephone Encounter (Addendum)
Medication Refill - Medication: (PT scheduled an appt for 07/23/20 and is looking for a courtesy refill to hold her over)  metFORMIN (GLUCOPHAGE) 1000 MG tablet  lisinopril-hydrochlorothiazide (ZESTORETIC) 10-12.5 MG tablet atorvastatin (LIPITOR) 40 MG tablet  glipiZIDE (GLUCOTROL) 10 MG tablet    Has the patient contacted their pharmacy? Yes.  Told her to contact PCP, no refills left and PT is getting low.    Preferred Pharmacy (with phone number or street name):  [facility] ([place]), [place] - [address]  [address] [place]) [place] [address]  Phone: [phone] Fax: [phone]    Agent: Please be advised that RX refills may take up to 3 business days. We ask that you follow-up with your pharmacy.

## 2020-07-02 MED ORDER — GLIPIZIDE 10 MG PO TABS: ORAL_TABLET | ORAL | 0 refills | Status: DC

## 2020-07-02 MED ORDER — ATORVASTATIN CALCIUM 40 MG PO TABS: 40.0000 mg | ORAL_TABLET | Freq: Every day | ORAL | 0 refills | Status: DC

## 2020-07-02 MED ORDER — METFORMIN HCL 1000 MG PO TABS: ORAL_TABLET | ORAL | 0 refills | Status: DC

## 2020-07-02 MED ORDER — LISINOPRIL-HYDROCHLOROTHIAZIDE 10-12.5 MG PO TABS: 1.0000 | ORAL_TABLET | Freq: Every day | ORAL | 0 refills | Status: DC

## 2020-07-02 NOTE — Telephone Encounter (Signed)
Requested Prescriptions  Pending Prescriptions Disp Refills  . metFORMIN (GLUCOPHAGE) 1000 MG tablet 60 tablet 0    Sig: TAKE ONE TABLET BY MOUTH TWICE A DAY WITH A MEAL     Endocrinology:  Diabetes - Biguanides Failed - 07/01/2020  3:18 PM      Failed - HBA1C is between 0 and 7.9 and within 180 days    Hemoglobin A1C  Date Value Ref Range Status  04/22/2020 9.2 (A) 4.0 - 5.6 % Final   Hgb A1c MFr Bld  Date Value Ref Range Status  12/14/2019 12.6 (H) 4.8 - 5.6 % Final    Comment:             Prediabetes: 5.7 - 6.4          Diabetes: >6.4          Glycemic control for adults with diabetes: <7.0          Passed - Cr in normal range and within 360 days    Creat  Date Value Ref Range Status  07/08/2015 0.59 0.50 - 1.10 mg/dL Final   Creatinine, Ser  Date Value Ref Range Status  12/14/2019 0.77 0.57 - 1.00 mg/dL Final         Passed - eGFR in normal range and within 360 days    GFR calc Af Amer  Date Value Ref Range Status  12/14/2019 105 >59 mL/min/1.73 Final    Comment:    **[facility] currently reports eGFR in compliance with the current**   recommendations of the [employer]. [facility] will   update reporting as new guidelines are published from the NKF-ASN   Task force.    GFR calc non Af Amer  Date Value Ref Range Status  12/14/2019 91 >59 mL/min/1.73 Final         Passed - Valid encounter within last 6 months    Recent Outpatient Visits          2 months ago Uncontrolled type 2 diabetes mellitus with hyperglycemia [facility])   Primary Care at [facility] [place], [place]   6 months ago Uncontrolled type 2 diabetes mellitus with hyperglycemia [facility])   Primary Care at [place], [place]   7 months ago Uncontrolled type 2 diabetes mellitus with hyperglycemia [facility])   Primary Care at [facility], Lilia Argue, MD   1 year ago Uncontrolled type 2 diabetes mellitus with hyperglycemia [facility])   [facility] at [facility], Lilia Argue, MD    1 year ago Uncontrolled type 2 diabetes mellitus with hyperglycemia [facility])   Primary Care at [place], [place]      Future Appointments            In 3 weeks Sagardia, Ines Bloomer, MD Primary Care at [place], [facility]           . lisinopril-hydrochlorothiazide (ZESTORETIC) 10-12.5 MG tablet 30 tablet 0    Sig: Take 1 tablet by mouth daily.     Cardiovascular:  ACEI + Diuretic Combos Failed - 07/01/2020  3:18 PM      Failed - Na in normal range and within 180 days    Sodium  Date Value Ref Range Status  12/14/2019 139 134 - 144 mmol/L Final         Failed - K in normal range and within 180 days    Potassium  Date Value Ref Range Status  12/14/2019 4.5 3.5 - 5.2 mmol/L Final  Failed - Cr in normal range and within 180 days    Creat  Date Value Ref Range Status  07/08/2015 0.59 0.50 - 1.10 mg/dL Final   Creatinine, Ser  Date Value Ref Range Status  12/14/2019 0.77 0.57 - 1.00 mg/dL Final         Failed - Ca in normal range and within 180 days    Calcium  Date Value Ref Range Status  12/14/2019 9.5 8.7 - 10.2 mg/dL Final         Passed - Patient is not pregnant      Passed - Last BP in normal range    BP Readings from Last 1 Encounters:  04/22/20 119/83         Passed - Valid encounter within last 6 months    Recent Outpatient Visits          2 months ago Uncontrolled type 2 diabetes mellitus with hyperglycemia [facility])   Primary Care at [place], Ines Bloomer, MD   6 months ago Uncontrolled type 2 diabetes mellitus with hyperglycemia [facility])   Primary Care at [place], MD   7 months ago Uncontrolled type 2 diabetes mellitus with hyperglycemia [facility])   [facility] at [facility], Lilia Argue, MD   1 year ago Uncontrolled type 2 diabetes mellitus with hyperglycemia [facility])   Primary Care at [facility], Lilia Argue, MD   1 year ago Uncontrolled type 2 diabetes mellitus with hyperglycemia [facility])   Primary Care at  [place], [place]      Future Appointments            In 3 weeks Sagardia, Ines Bloomer, MD Primary Care at [place], [facility]           . glipiZIDE (GLUCOTROL) 10 MG tablet 60 tablet 0    Sig: TAKE ONE TABLET BY MOUTH TWICE A DAY BEFORE A MEAL     Endocrinology:  Diabetes - Sulfonylureas Failed - 07/01/2020  3:18 PM      Failed - HBA1C is between 0 and 7.9 and within 180 days    Hemoglobin A1C  Date Value Ref Range Status  04/22/2020 9.2 (A) 4.0 - 5.6 % Final   Hgb A1c MFr Bld  Date Value Ref Range Status  12/14/2019 12.6 (H) 4.8 - 5.6 % Final    Comment:             Prediabetes: 5.7 - 6.4          Diabetes: >6.4          Glycemic control for adults with diabetes: <7.0          Passed - Valid encounter within last 6 months    Recent Outpatient Visits          2 months ago Uncontrolled type 2 diabetes mellitus with hyperglycemia [facility])   Primary Care at [place], [place], MD   6 months ago Uncontrolled type 2 diabetes mellitus with hyperglycemia [facility])   Primary Care at [place], MD   7 months ago Uncontrolled type 2 diabetes mellitus with hyperglycemia [facility])   Primary Care at [place], Lilia Argue, MD   1 year ago Uncontrolled type 2 diabetes mellitus with hyperglycemia [facility])   Primary Care at [facility], Lilia Argue, MD   1 year ago Uncontrolled type 2 diabetes mellitus with hyperglycemia [facility])   Primary Care at [facility] [place]  M, MD      Future Appointments            In 3 weeks Sagardia, Ines Bloomer, MD Primary Care at [place], [facility]           . atorvastatin (LIPITOR) 40 MG tablet 30 tablet 0    Sig: Take 1 tablet (40 mg total) by mouth daily.     Cardiovascular:  Antilipid - Statins Failed - 07/01/2020  3:18 PM      Failed - Total Cholesterol in normal range and within 360 days    Cholesterol, Total  Date Value Ref Range Status  03/14/2019 126 100 - 199 mg/dL Final         Failed - LDL in  normal range and within 360 days    LDL Chol Calc (NIH)  Date Value Ref Range Status  03/14/2019 55 0 - 99 mg/dL Final         Failed - HDL in normal range and within 360 days    HDL  Date Value Ref Range Status  03/14/2019 40 >39 mg/dL Final         Failed - Triglycerides in normal range and within 360 days    Triglycerides  Date Value Ref Range Status  03/14/2019 189 (H) 0 - 149 mg/dL Final         Passed - Patient is not pregnant      Passed - Valid encounter within last 12 months    Recent Outpatient Visits          2 months ago Uncontrolled type 2 diabetes mellitus with hyperglycemia [facility])   Primary Care at [place], [place], MD   6 months ago Uncontrolled type 2 diabetes mellitus with hyperglycemia [facility])   [facility] at [facility], Lilia Argue, MD   7 months ago Uncontrolled type 2 diabetes mellitus with hyperglycemia [facility])   Primary Care at [facility], Lilia Argue, MD   1 year ago Uncontrolled type 2 diabetes mellitus with hyperglycemia [facility])   Primary Care at [facility], Lilia Argue, MD   1 year ago Uncontrolled type 2 diabetes mellitus with hyperglycemia [facility])   Primary Care at [facility], Lilia Argue, MD      Future Appointments            In 3 weeks Sagardia, Ines Bloomer, MD Primary Care at [place], [facility]

## 2020-07-23 ENCOUNTER — Ambulatory Visit: Payer: BC Managed Care – PPO | Admitting: Emergency Medicine

## 2020-07-23 ENCOUNTER — Encounter: Payer: Self-pay | Admitting: Emergency Medicine

## 2020-07-23 ENCOUNTER — Other Ambulatory Visit: Payer: Self-pay

## 2020-07-23 VITALS — BP 91/64 | HR 94 | Temp 97.5°F | Resp 16 | Ht 65.0 in | Wt 239.0 lb

## 2020-07-23 DIAGNOSIS — E1159 Type 2 diabetes mellitus with other circulatory complications: Secondary | ICD-10-CM

## 2020-07-23 DIAGNOSIS — I10 Essential (primary) hypertension: Secondary | ICD-10-CM

## 2020-07-23 DIAGNOSIS — E1165 Type 2 diabetes mellitus with hyperglycemia: Secondary | ICD-10-CM | POA: Diagnosis not present

## 2020-07-23 DIAGNOSIS — I152 Hypertension secondary to endocrine disorders: Secondary | ICD-10-CM

## 2020-07-23 DIAGNOSIS — E782 Mixed hyperlipidemia: Secondary | ICD-10-CM | POA: Diagnosis not present

## 2020-07-23 LAB — POCT GLYCOSYLATED HEMOGLOBIN (HGB A1C): Hemoglobin A1C: 9.9 % — AB (ref 4.0–5.6)

## 2020-07-23 LAB — GLUCOSE, POCT (MANUAL RESULT ENTRY): POC Glucose: 331 mg/dl — AB (ref 70–99)

## 2020-07-23 MED ORDER — ATORVASTATIN CALCIUM 40 MG PO TABS: 40.0000 mg | ORAL_TABLET | Freq: Every day | ORAL | 3 refills | Status: DC

## 2020-07-23 MED ORDER — LISINOPRIL-HYDROCHLOROTHIAZIDE 10-12.5 MG PO TABS
1.0000 | ORAL_TABLET | Freq: Every day | ORAL | 3 refills | Status: DC
Start: 2020-07-23 — End: 2020-07-26

## 2020-07-23 MED ORDER — METFORMIN HCL 1000 MG PO TABS: ORAL_TABLET | ORAL | 3 refills | Status: DC

## 2020-07-23 MED ORDER — GLIPIZIDE 10 MG PO TABS
ORAL_TABLET | ORAL | 3 refills | Status: DC
Start: 2020-07-23 — End: 2020-07-26

## 2020-07-23 MED ORDER — DAPAGLIFLOZIN PROPANEDIOL 5 MG PO TABS: 5.0000 mg | ORAL_TABLET | Freq: Every day | ORAL | 3 refills | Status: DC

## 2020-07-23 NOTE — Patient Instructions (Addendum)
If you have lab work done today you will be contacted with your lab results within the next 2 weeks.  If you have not heard from [place] then please contact us. The fastest way to get your results is to register for My Chart.   IF you received an x-ray today, you will receive an invoice from [facility] Rad[facility]. Please contact [facility] Radiology at [phone] with questions or concerns regarding your invoice.   IF you received labwork today, you will receive an invoice from [place]. Please contact [facility] at [phone] with questions or concerns regarding your invoice.   Our billing staff will not be able to assist you with questions regarding bills from these companies.  You will be contacted with the lab results as soon as they are available. The fastest way to get your results is to activate your My Chart account. Instructions are located on the last page of this paperwork. If you have not heard from [place] regarding the results in 2 weeks, please contact this office.     Diabetes mellitus y nutricin, en adultos Diabetes Mellitus and Nutrition, Adult Si sufre de diabetes, o diabetes mellitus, es muy importante tener hbitos alimenticios saludables debido a que sus niveles de Designer, television/film set sangre (glucosa) se ven afectados en gran medida por lo que come y bebe. [place] alimentos saludables en las cantidades correctas, aproximadamente a la misma hora todos los [place], [place] ayudar a:  Aeronautical engineer glucemia.  Disminuir el riesgo de sufrir una enfermedad cardaca.  Mejorar la presin arterial.  Science writer o mantener un peso saludable. Qu puede afectar mi plan de alimentacin? Todas las personas que sufren de diabetes son diferentes y cada una tiene necesidades diferentes en cuanto a un plan de alimentacin. El mdico puede recomendarle que trabaje con un nutricionista para elaborar el mejor plan para usted. Su plan de alimentacin puede variar segn factores como:  Las caloras que  necesita.  Los medicamentos que toma.  Su peso.  Sus niveles de glucemia, presin arterial y colesterol.  Su nivel de [place].  Otras afecciones que tenga, como enfermedades cardacas o renales. Cmo me afectan los carbohidratos? Los carbohidratos, o hidratos de carbono, afectan su nivel de glucemia ms que cualquier otro tipo de alimento. La ingesta de carbohidratos naturalmente aumenta la cantidad de [employer]. El recuento de carbohidratos es un mtodo destinado a Catering manager un registro de la cantidad de carbohidratos que se consumen. El recuento de carbohidratos es importante para Theatre manager la glucemia a un nivel saludable, especialmente si utiliza insulina o toma determinados medicamentos por va oral para la diabetes. Es importante conocer la cantidad de carbohidratos que se pueden ingerir en cada comida sin correr Engineer, manufacturing. Esto es Psychologist, forensic. Su nutricionista puede ayudarlo a calcular la cantidad de carbohidratos que debe ingerir en cada comida y en cada refrigerio. Cmo me afecta el alcohol? El alcohol puede provocar disminuciones sbitas de la glucemia (hipoglucemia), especialmente si utiliza insulina o toma determinados medicamentos por va oral para la diabetes. La hipoglucemia es una afeccin potencialmente mortal. Los sntomas de la hipoglucemia, como somnolencia, mareos y confusin, son similares a los sntomas de haber consumido demasiado alcohol.  No beba alcohol si: ? Su mdico le indica no hacerlo. ? Est embarazada, puede estar embarazada o est tratando de quedar embarazada.  Si bebe alcohol: ? No beba con el estmago vaco. ? Limite la cantidad que bebe:  De 0 a 1 medida por da para las  mujeres.  De 0 a 2 medidas por da para los hombres. ? Est atento a la cantidad de alcohol que hay en las bebidas que toma. En los [place], una medida equivale a una botella de cerveza de 12oz (368ml), un vaso de vino de 5oz (175ml) o un vaso  de una bebida alcohlica de alta graduacin de 1oz (60ml). ? Mantngase hidratado bebiendo agua, refrescos dietticos o t helado sin azcar.  Tenga en cuenta que los refrescos comunes, los jugos y otras bebida para Optician, dispensing pueden contener mucha azcar y se deben contar como carbohidratos. Consejos para seguir Photographer las etiquetas de los alimentos  Comience por leer el tamao de la porcin en la "Informacin nutricional" en las etiquetas de los alimentos envasados y las bebidas. La cantidad de caloras, carbohidratos, grasas y otros nutrientes mencionados en la etiqueta se basan en una porcin del alimento. Muchos alimentos contienen ms de una porcin por envase.  Verifique la cantidad total de gramos (g) de carbohidratos totales en una porcin. Puede calcular la cantidad de porciones de carbohidratos al dividir el total de carbohidratos por 15. Por ejemplo, si un alimento tiene un total de 30g de carbohidratos totales por porcin, equivale a 2 porciones de carbohidratos.  Verifique la cantidad de gramos (g) de grasas saturadas y grasas trans de una porcin. Escoja alimentos que no contengan estas grasas o que su contenido de estas sea [place].  Verifique la cantidad de miligramos (mg) de sal (sodio) en una porcin. [place] [employer] de las personas deben limitar la ingesta de sodio total a menos de 2300mg  por Training and development officer.  Siempre consulte la informacin nutricional de los alimentos etiquetados como "con bajo contenido de grasa" o "sin grasa". Estos alimentos pueden tener un mayor contenido de Location manager agregada o carbohidratos refinados, y deben evitarse.  Hable con su nutricionista para identificar sus objetivos diarios en cuanto a los nutrientes mencionados en la etiqueta. Al ir de compras  Evite comprar alimentos procesados, enlatados o precocidos. Estos alimentos tienden a Special educational needs teacher mayor cantidad de [place], sodio y azcar agregada.  Compre en la zona exterior de la tienda de comestibles. Esta es  la zona donde se encuentran con mayor frecuencia las frutas y las verduras frescas, los cereales a granel, las carnes frescas y los productos lcteos frescos. Al cocinar  Utilice mtodos de coccin a baja temperatura, como hornear, en lugar de mtodos de coccin a alta temperatura, como frer en abundante aceite.  Cocine con aceites saludables, como el aceite de [place], canola o [place].  Evite cocinar con manteca, crema o carnes con alto contenido de grasa. Planificacin de las comidas  Coma las comidas y los refrigerios regularmente, preferentemente a la misma hora todos [place]. Evite pasar largos perodos de tiempo sin comer.  Consuma alimentos ricos en fibra, como frutas frescas, verduras, frijoles y cereales integrales. Consulte a su nutricionista sobre cuntas porciones de carbohidratos puede consumir en cada comida.  Consuma entre 4 y 6 onzas (entre 112 y 168g) de protenas magras por da, como carnes magras, pollo, pescado, huevos o tofu. Una onza (oz) de protena magra equivale a: ? 1 onza (28g) de carne, pollo o pescado. ? 1huevo. ?  de taza (62 g) de tofu.  Coma algunos alimentos por da que contengan grasas saludables, como aguacates, frutos secos, semillas y pescado.   Qu alimentos debo comer? Lambert Mody Bayas. Manzanas. Naranjas. Duraznos. Damascos. Ciruelas. Uvas. Mango. Papaya. [place]. Kiwi. Cerezas. [place] Valeda Malm. Espin[place]. Verduras de [employer], que incluyen  col rizada, acelga, hojas de berza y de [place]. Remolachas. Coliflor. Repollo. Brcoli. Zanahorias. Judas verdes. Tomates. Pimientos. Cebollas. Pepinos. Coles de Bruselas. Granos Granos integrales, como panes, galletas, tortillas, cereales y pastas de salvado o integrales. Avena sin azcar. Quinua. Arroz integral o salvaje. Carnes y Psychiatric nurse. Carne de ave sin piel. Cortes magros de ave y carne de res. Tofu. Frutos secos. Semillas. Lcteos Productos lcteos sin grasa o con bajo contenido de  [place], [place], yogur y [place]. Es posible que los productos que se enumeran ms [place] no constituyan una lista completa de los alimentos y las bebidas que puede tomar. Consulte a un nutricionista para obtener ms informacin. Qu alimentos debo evitar? Lambert Mody Frutas enlatadas al almbar. Verduras Verduras enlatadas. Verduras congeladas con mantequilla o salsa de crema. Granos Productos elaborados con [place] y [place], como panes, pastas, bocadillos y cereales. Evite todos los alimentos procesados. Carnes y otras protenas Cortes de carne con alto contenido de Lobbyist. Carne de ave con piel. Carnes empanizadas o fritas. Carne procesada. Evite las grasas saturadas. Lcteos Yogur, [place] enteros. Bebidas Bebidas azucaradas, como gaseosas o t helado. Es posible que los productos que se enumeran ms [place] no constituyan una lista completa de los alimentos y las bebidas que Nurse, adult. Consulte a un nutricionista para obtener ms informacin. Preguntas para hacerle al mdico  Es necesario que me rena con Radio broadcast assistant en el cuidado de la diabetes?  Es necesario que me rena con un nutricionista?  A qu nmero puedo llamar si tengo preguntas?  Cules son los mejores momentos para controlar la glucemia? Dnde encontrar ms informacin:  Asociacin Estadounidense de la Diabetes (American Diabetes Association): diabetes.org  Academy of Nutrition and Dietetics (Academia de Nutricin y Information systems manager): www.eatright.[employer] of Diabetes and Digestive and Kidney Diseases ([place] la Diabetes y [place] y [place]): DesMoinesFuneral.dk  Association of Diabetes Care and Education Specialists (Asociacin de Especialistas en Atencin y Educacin sobre la Diabetes): www.diabeteseducator.org Resumen  Es importante tener hbitos alimenticios saludables debido a que sus niveles de Designer, television/film set sangre (glucosa) se ven afectados en  gran medida por lo que come y bebe.  Un plan de alimentacin saludable lo ayudar a controlar la glucemia y Theatre manager un estilo de vida saludable.  El mdico puede recomendarle que trabaje con un nutricionista para elaborar el mejor plan para usted.  Tenga en cuenta que los carbohidratos (hidratos de carbono) y el alcohol tienen efectos inmediatos en sus niveles de glucemia. Es importante contar los carbohidratos que ingiere y consumir alcohol con prudencia. Esta informacin no tiene Marine scientist el consejo del mdico. Asegrese de hacerle al mdico cualquier pregunta que tenga. Document Revised: 06/22/2019 Document Reviewed: 06/22/2019 Elsevier Patient Education  2021 [employer].

## 2020-07-23 NOTE — Progress Notes (Signed)
Jackie Wells 50 y.o.   Chief Complaint  Patient presents with  . Diabetes  . Hypertension  . Hyperlipidemia    Follow up 3 month  . Medication Refill    Pend    HISTORY OF PRESENT ILLNESS: This is a 50 y.o. female with history of diabetes, hypertension and dyslipidemia here for 58-monthfollow-up and medication refill. Doing well.  Has no complaints or medical concerns today.  HPI   Prior to Admission medications   Medication Sig Start Date End Date Taking? Authorizing Provider  atorvastatin (LIPITOR) 40 MG tablet Take 1 tablet (40 mg total) by mouth daily. 07/02/20  Yes Jackie Agreste MD  glipiZIDE (GLUCOTROL) 10 MG tablet TAKE ONE TABLET BY MOUTH TWICE A DAY BEFORE A MEAL 07/02/20  Yes Jackie Agreste MD  lisinopril-hydrochlorothiazide (ZESTORETIC) 10-12.5 MG tablet Take 1 tablet by mouth daily. 07/02/20  Yes Jackie Agreste MD  metFORMIN (GLUCOPHAGE) 1000 MG tablet TAKE ONE TABLET BY MOUTH TWICE A DAY WITH A MEAL 07/02/20  Yes Jackie Agreste MD  blood glucose meter kit and supplies KIT Per insurance preference. Check blood glucose once a day. E11.9 03/14/19   Jackie Wells Jackie Argue MD  meloxicam (MOBIC) 15 MG tablet Take 1 tablet (15 mg total) by mouth daily. Patient not taking: Reported on 07/23/2020 03/26/20 03/26/21  Jackie Wells DPM    No Known Allergies  Patient Active Problem List   Diagnosis Date Noted  . Varicose veins of left lower extremity with complications 093/[id number] . Diabetes type 2, uncontrolled ([place] 11/09/2014  . Obesity 08/04/2014  . Dyslipidemia associated with type 2 diabetes mellitus ([facility] 08/04/2014  . Hypertension associated with diabetes ([place] 08/04/2014    Past Medical History:  Diagnosis Date  . Anemia   . Diabetes mellitus without complication ([facility]   . Hyperlipidemia   . Hypertension     Past Surgical History:  Procedure Laterality Date  . LAPAROSCOPIC UNILATERAL SALPINGO OOPHERECTOMY      Social History    Socioeconomic History  . Marital status: Single    Spouse name: Not on file  . Number of children: Not on file  . Years of education: Not on file  . Highest education level: Not on file  Occupational History  . Not on file  Tobacco Use  . Smoking status: Never Smoker  . Smokeless tobacco: Never Used  Substance and Sexual Activity  . Alcohol use: No    Alcohol/week: 0.0 standard drinks  . Drug use: No  . Sexual activity: Yes  Other Topics Concern  . Not on file  Social History Narrative  . Not on file   Social Determinants of Health   Financial Resource Strain: Not on file  Food Insecurity: Not on file  Transportation Needs: Not on file  Physical Activity: Not on file  Stress: Not on file  Social Connections: Not on file  Intimate Partner Violence: Not on file    Family History  Problem Relation Age of Onset  . Hyperlipidemia Mother   . Hypertension Mother   . Hypertension Father      Review of Systems  Constitutional: Negative.  Negative for chills and fever.  HENT: Negative.  Negative for congestion and sore throat.   Respiratory: Negative.  Negative for cough and shortness of breath.   Cardiovascular: Negative.  Negative for chest pain and palpitations.  Gastrointestinal: Negative.  Negative for abdominal pain, diarrhea, nausea and vomiting.  Genitourinary: Negative.  Negative for dysuria  and hematuria.  Skin: Negative.  Negative for rash.  Neurological: Negative.  Negative for dizziness and headaches.  All other systems reviewed and are negative.    Today's Vitals   07/23/20 1642  BP: 91/64  Pulse: 94  Resp: 16  Temp: (!) 97.5 F (36.4 C)  TempSrc: Temporal  SpO2: 94%  Weight: 239 lb (108.4 kg)  Height: 5' 5"  (1.651 m)   Body mass index is 39.77 kg/m. Wt Readings from Last 3 Encounters:  07/23/20 239 lb (108.4 kg)  04/22/20 236 lb 3.2 oz (107.1 kg)  12/14/19 223 lb 6.4 oz (101.3 kg)     Physical Exam Vitals reviewed.  Constitutional:       Appearance: Normal appearance. She is obese.  HENT:     Head: Normocephalic.  Eyes:     Extraocular Movements: Extraocular movements intact.     Conjunctiva/sclera: Conjunctivae normal.     Pupils: Pupils are equal, round, and reactive to light.  Cardiovascular:     Rate and Rhythm: Normal rate and regular rhythm.     Pulses: Normal pulses.     Heart sounds: Normal heart sounds.  Pulmonary:     Effort: Pulmonary effort is normal.     Breath sounds: Normal breath sounds.  Musculoskeletal:        General: Normal range of motion.     Cervical back: Normal range of motion.  Skin:    General: Skin is warm and dry.     Capillary Refill: Capillary refill takes less than 2 seconds.  Neurological:     General: No focal deficit present.     Mental Status: She is alert and oriented to person, place, and time.  Psychiatric:        Mood and Affect: Mood normal.        Behavior: Behavior normal.    Results for orders placed or performed in visit on 07/23/20 (from the past 24 hour(s))  POCT glucose (manual entry)     Status: Abnormal   Collection Time: 07/23/20  5:20 PM  Result Value Ref Range   POC Glucose 331 (A) 70 - 99 mg/dl  POCT glycosylated hemoglobin (Hb A1C)     Status: Abnormal   Collection Time: 07/23/20  5:22 PM  Result Value Ref Range   Hemoglobin A1C 9.9 (A) 4.0 - 5.6 %   HbA1c POC (<> result, manual entry)     HbA1c, POC (prediabetic range)     HbA1c, POC (controlled diabetic range)       ASSESSMENT & PLAN: Hypertension associated with diabetes ([place]) Well-controlled hypertension.  Continue present medication. Uncontrolled diabetes with hemoglobin A1c of 9.9.  Patient noncompliant with diet and nutrition.  Continue glipizide 10 mg twice a day and Metformin 1000 mg twice a day. Took Trulicity in the past but no longer covered by insurance. Start Farxiga 5 mg once a day.  May not be covered by patient's insurance. Diet and nutrition discussed. Continue atorvastatin  40 mg daily. Follow-up in 3 months.  Jackie Wells was seen today for diabetes, hypertension, hyperlipidemia and medication refill.  Diagnoses and all orders for this visit:  Uncontrolled type 2 diabetes mellitus with hyperglycemia ([place]) -     POCT glucose (manual entry) -     POCT glycosylated hemoglobin (Hb A1C) -     CMP14+EGFR -     glipiZIDE (GLUCOTROL) 10 MG tablet; TAKE ONE TABLET BY MOUTH TWICE A DAY BEFORE A MEAL -     metFORMIN (GLUCOPHAGE) 1000 MG  tablet; TAKE ONE TABLET BY MOUTH TWICE A DAY WITH A MEAL -     dapagliflozin propanediol (FARXIGA) 5 MG TABS tablet; Take 1 tablet (5 mg total) by mouth daily before breakfast.  Mixed hyperlipidemia -     atorvastatin (LIPITOR) 40 MG tablet; Take 1 tablet (40 mg total) by mouth daily.  Essential hypertension -     lisinopril-hydrochlorothiazide (ZESTORETIC) 10-12.5 MG tablet; Take 1 tablet by mouth daily.  Morbid obesity ([place])  Hypertension associated with diabetes [facility])    Patient Instructions       If you have lab work done today you will be contacted with your lab results within the next 2 weeks.  If you have not heard from [place] then please contact us. The fastest way to get your results is to register for My Chart.   IF you received an x-ray today, you will receive an invoice from [facility]. Please contact [facility] at [phone] with questions or concerns regarding your invoice.   IF you received labwork today, you will receive an invoice from [place]. Please contact [facility] at [phone] with questions or concerns regarding your invoice.   Our billing staff will not be able to assist you with questions regarding bills from these companies.  You will be contacted with the lab results as soon as they are available. The fastest way to get your results is to activate your My Chart account. Instructions are located on the last page of this paperwork. If you have not heard from [place] regarding the  results in 2 weeks, please contact this office.     Diabetes mellitus y nutricin, en adultos Diabetes Mellitus and Nutrition, Adult Si sufre de diabetes, o diabetes mellitus, es muy importante tener hbitos alimenticios saludables debido a que sus niveles de Designer, television/film set sangre (glucosa) se ven afectados en gran medida por lo que come y bebe. [place] alimentos saludables en las cantidades correctas, aproximadamente a la misma hora todos los [place], [place] ayudar a:  Aeronautical engineer glucemia.  Disminuir el riesgo de sufrir una enfermedad cardaca.  Mejorar la presin arterial.  Science writer o mantener un peso saludable. Qu puede afectar mi plan de alimentacin? Todas las personas que sufren de diabetes son diferentes y cada una tiene necesidades diferentes en cuanto a un plan de alimentacin. El mdico puede recomendarle que trabaje con un nutricionista para elaborar el mejor plan para usted. Su plan de alimentacin puede variar segn factores como:  Las caloras que necesita.  Los medicamentos que toma.  Su peso.  Sus niveles de glucemia, presin arterial y colesterol.  Su nivel de [place].  Otras afecciones que tenga, como enfermedades cardacas o renales. Cmo me afectan los carbohidratos? Los carbohidratos, o hidratos de carbono, afectan su nivel de glucemia ms que cualquier otro tipo de alimento. La ingesta de carbohidratos naturalmente aumenta la cantidad de [employer]. El recuento de carbohidratos es un mtodo destinado a Catering manager un registro de la cantidad de carbohidratos que se consumen. El recuento de carbohidratos es importante para Theatre manager la glucemia a un nivel saludable, especialmente si utiliza insulina o toma determinados medicamentos por va oral para la diabetes. Es importante conocer la cantidad de carbohidratos que se pueden ingerir en cada comida sin correr Engineer, manufacturing. Esto es Psychologist, forensic. Su nutricionista puede ayudarlo a calcular la cantidad de  carbohidratos que debe ingerir en cada comida y en cada refrigerio. Cmo me afecta el alcohol? El alcohol puede provocar disminuciones sbitas de  la glucemia (hipoglucemia), especialmente si utiliza insulina o toma determinados medicamentos por va oral para la diabetes. La hipoglucemia es una afeccin potencialmente mortal. Los sntomas de la hipoglucemia, como somnolencia, mareos y confusin, son similares a los sntomas de haber consumido demasiado alcohol.  No beba alcohol si: ? Su mdico le indica no hacerlo. ? Est embarazada, puede estar embarazada o est tratando de quedar embarazada.  Si bebe alcohol: ? No beba con el estmago vaco. ? Limite la cantidad que bebe:  De 0 a 1 medida por da para las mujeres.  De 0 a 2 medidas por da para los hombres. ? Est atento a la cantidad de alcohol que hay en las bebidas que toma. En los [place], una medida equivale a una botella de cerveza de 12oz (324m), un vaso de vino de 5oz (146m o un vaso de una bebida alcohlica de alta graduacin de 1oz (4451m ? Mantngase hidratado bebiendo agua, refrescos dietticos o t helado sin azcar.  Tenga en cuenta que los refrescos comunes, los jugos y otras bebida para mezOptician, dispensingeden contener mucha azcar y se deben contar como carbohidratos. Consejos para seguir estPhotographers etiquetas de los alimentos  Comience por leer el tamao de la porcin en la "Informacin nutricional" en las etiquetas de los alimentos envasados y las bebidas. La cantidad de caloras, carbohidratos, grasas y otros nutrientes mencionados en la etiqueta se basan en una porcin del alimento. Muchos alimentos contienen ms de una porcin por envase.  Verifique la cantidad total de gramos (g) de carbohidratos totales en una porcin. Puede calcular la cantidad de porciones de carbohidratos al dividir el total de carbohidratos por 15. Por ejemplo, si un alimento tiene un total de 30g de carbohidratos totales por  porcin, equivale a 2 porciones de carbohidratos.  Verifique la cantidad de gramos (g) de grasas saturadas y grasas trans de una porcin. Escoja alimentos que no contengan estas grasas o que su contenido de estas sea bajDanburyVerifique la cantidad de miligramos (mg) de sal (sodio) en una porcin. La mayState Farm las personas deben limitar la ingesta de sodio total a menos de 2300m68mr da. Training and development officeriempre consulte la informacin nutricional de los alimentos etiquetados como "con bajo contenido de grasa" o "sin grasa". Estos alimentos pueden tener un mayor contenido de azcaLocation manageregada o carbohidratos refinados, y deben evitarse.  Hable con su nutricionista para identificar sus objetivos diarios en cuanto a los nutrientes mencionados en la etiqueta. Al ir de compras  Evite comprar alimentos procesados, enlatados o precocidos. Estos alimentos tienden a teneSpecial educational needs teacheror cantidad de grasHarrisonvilledio y azcar agregada.  Compre en la zona exterior de la tienda de comestibles. Esta es la zona donde se encuentran con mayor frecuencia las frutas y las verduras frescas, los cereales a granel, las carnes frescas y los productos lcteos frescos. Al cocinar  Utilice mtodos de coccin a baja temperatura, como hornear, en lugar de mtodos de coccin a alta temperatura, como frer en abundante aceite.  Cocine con aceites saludables, como el aceite de olivSky Valleynola o giraUkiahvite cocinar con manteca, crema o carnes con alto contenido de grasa. Planificacin de las comidas  Coma las comidas y los refrigerios regularmente, preferentemente a la misma hora todos los [place]ite pasar largos perodos de tiempo sin comer.  Consuma alimentos ricos en fibra, como frutas frescas, verduras, frijoles y cereales integrales. Consulte a su nutricionista sobre cuntas porciones de carbohidratos puede consumir en cada comida.  Consuma  entre 4 y 6 onzas (entre 112 y 168g) de protenas magras por da, como carnes magras, pollo,  pescado, huevos o tofu. Una onza (oz) de protena magra equivale a: ? 1 onza (28g) de carne, pollo o pescado. ? 1huevo. ?  de taza (62 g) de tofu.  Coma algunos alimentos por da que contengan grasas saludables, como aguacates, frutos secos, semillas y pescado.   Qu alimentos debo comer? Lambert Mody Bayas. Manzanas. Naranjas. Duraznos. Damascos. Ciruelas. Uvas. Mango. Papaya. [place]. Kiwi. Cerezas. [place] Valeda Malm. Espinaca. Verduras de [employer], que incluyen col rizada, [place], hojas de [place] y de [place]. Remolachas. Coliflor. Repollo. Brcoli. Zanahorias. Judas verdes. Tomates. Pimientos. Cebollas. Pepinos. Coles de Bruselas. Granos Granos integrales, como panes, galletas, tortillas, cereales y pastas de salvado o integrales. Avena sin azcar. Quinua. Arroz integral o salvaje. Carnes y Psychiatric nurse. Carne de ave sin piel. Cortes magros de ave y carne de res. Tofu. Frutos secos. Semillas. Lcteos Productos lcteos sin grasa o con bajo contenido de [place], [place], yogur y [place]. Es posible que los productos que se enumeran ms [place] no constituyan una lista completa de los alimentos y las bebidas que puede tomar. Consulte a un nutricionista para obtener ms informacin. Qu alimentos debo evitar? Lambert Mody Frutas enlatadas al almbar. Verduras Verduras enlatadas. Verduras congeladas con mantequilla o salsa de crema. Granos Productos elaborados con [place] y [place], como panes, pastas, bocadillos y cereales. Evite todos los alimentos procesados. Carnes y otras protenas Cortes de carne con alto contenido de Lobbyist. Carne de ave con piel. Carnes empanizadas o fritas. Carne procesada. Evite las grasas saturadas. Lcteos Yogur, Gantt enteros. Bebidas Bebidas azucaradas, como gaseosas o t helado. Es posible que los productos que se enumeran ms [place] no constituyan una lista completa de los alimentos y las bebidas que Nurse, adult. Consulte a un  nutricionista para obtener ms informacin. Preguntas para hacerle al mdico  Es necesario que me rena con Radio broadcast assistant en el cuidado de la diabetes?  Es necesario que me rena con un nutricionista?  A qu nmero puedo llamar si tengo preguntas?  Cules son los mejores momentos para controlar la glucemia? Dnde encontrar ms informacin:  Asociacin Estadounidense de la Diabetes (American Diabetes Association): diabetes.org  Academy of Nutrition and Dietetics (Academia de Nutricin y Information systems manager): www.eatright.[employer]X Corporation of Diabetes and Digestive and Kidney Diseases ([place] la Diabetes y [place] y [place]): DesMoinesFuneral.dk  Association of Diabetes Care and Education Specialists (Asociacin de Especialistas en Atencin y Educacin sobre la Diabetes): www.diabeteseducator.org Resumen  Es importante tener hbitos alimenticios saludables debido a que sus niveles de Designer, television/film set sangre (glucosa) se ven afectados en gran medida por lo que come y bebe.  Un plan de alimentacin saludable lo ayudar a controlar la glucemia y Theatre manager un estilo de vida saludable.  El mdico puede recomendarle que trabaje con un nutricionista para elaborar el mejor plan para usted.  Tenga en cuenta que los carbohidratos (hidratos de carbono) y el alcohol tienen efectos inmediatos en sus niveles de glucemia. Es importante contar los carbohidratos que ingiere y consumir alcohol con prudencia. Esta informacin no tiene Marine scientist el consejo del mdico. Asegrese de hacerle al mdico cualquier pregunta que tenga. Document Revised: 06/22/2019 Document Reviewed: 06/22/2019 Elsevier Patient Education  2021 Elsevier Inc.      Agustina Caroli, MD [facility]

## 2020-07-23 NOTE — Assessment & Plan Note (Addendum)
Well-controlled hypertension.  Continue present medication. Uncontrolled diabetes with hemoglobin A1c of 9.9.  Patient noncompliant with diet and nutrition.  Continue glipizide 10 mg twice a day and Metformin 1000 mg twice a day. Took Trulicity in the past but no longer covered by insurance. Start Farxiga 5 mg once a day.  May not be covered by patient's insurance. Diet and nutrition discussed. Continue atorvastatin 40 mg daily. Follow-up in 3 months.

## 2020-07-24 LAB — CMP14+EGFR
ALT: 25 IU/L (ref 0–32)
AST: 19 IU/L (ref 0–40)
Albumin/Globulin Ratio: 1.7 (ref 1.2–2.2)
Albumin: 4.3 g/dL (ref 3.8–4.8)
Alkaline Phosphatase: 97 IU/L (ref 44–121)
BUN/Creatinine Ratio: 22 (ref 9–23)
BUN: 22 mg/dL (ref 6–24)
Bilirubin Total: 0.3 mg/dL (ref 0.0–1.2)
CO2: 19 mmol/L — ABNORMAL LOW (ref 20–29)
Calcium: 9 mg/dL (ref 8.7–10.2)
Chloride: 102 mmol/L (ref 96–106)
Creatinine, Ser: 1.02 mg/dL — ABNORMAL HIGH (ref 0.57–1.00)
GFR calc Af Amer: 75 mL/min/{1.73_m2} (ref >59)
GFR calc non Af Amer: 65 mL/min/{1.73_m2} (ref >59)
Globulin, Total: 2.6 g/dL (ref 1.5–4.5)
Glucose: 312 mg/dL — ABNORMAL HIGH (ref 65–99)
Potassium: 4.2 mmol/L (ref 3.5–5.2)
Sodium: 137 mmol/L (ref 134–144)
Total Protein: 6.9 g/dL (ref 6.0–8.5)

## 2020-07-26 ENCOUNTER — Other Ambulatory Visit: Payer: Self-pay | Admitting: Emergency Medicine

## 2020-07-26 DIAGNOSIS — I10 Essential (primary) hypertension: Secondary | ICD-10-CM

## 2020-07-26 DIAGNOSIS — E1165 Type 2 diabetes mellitus with hyperglycemia: Secondary | ICD-10-CM

## 2020-07-26 DIAGNOSIS — E782 Mixed hyperlipidemia: Secondary | ICD-10-CM

## 2020-07-26 MED ORDER — METFORMIN HCL 1000 MG PO TABS: ORAL_TABLET | ORAL | 3 refills | Status: DC

## 2020-07-26 MED ORDER — ATORVASTATIN CALCIUM 40 MG PO TABS: 40.0000 mg | ORAL_TABLET | Freq: Every day | ORAL | 3 refills | Status: DC

## 2020-07-26 MED ORDER — DAPAGLIFLOZIN PROPANEDIOL 5 MG PO TABS: 5.0000 mg | ORAL_TABLET | Freq: Every day | ORAL | 3 refills | Status: AC

## 2020-07-26 MED ORDER — LISINOPRIL-HYDROCHLOROTHIAZIDE 10-12.5 MG PO TABS: 1.0000 | ORAL_TABLET | Freq: Every day | ORAL | 3 refills | Status: DC

## 2020-07-26 MED ORDER — GLIPIZIDE 10 MG PO TABS: ORAL_TABLET | ORAL | 3 refills | Status: DC

## 2020-07-26 NOTE — Telephone Encounter (Signed)
Rx forwarded to new pharmacy per patient request. 

## 2020-07-26 NOTE — Telephone Encounter (Signed)
atorvastatin (LIPITOR) 40 MG tablet  dapagliflozin propanediol (FARXIGA) 5 MG TABS tablet   glipiZIDE (GLUCOTROL) 10 MG tablet  lisinopril-hydrochlorothiazide (ZESTORETIC) 10-12.5 MG tablet   metFORMIN (GLUCOPHAGE) 1000 MG tablet   Pts refills [facility] but [facility] so they will be covered by insurance/ please send to   [id number] [place] [place] [address] Phone:  [phone]  Fax:  [phone]

## 2020-09-18 ENCOUNTER — Other Ambulatory Visit: Payer: Self-pay | Admitting: Emergency Medicine

## 2020-09-18 DIAGNOSIS — E1165 Type 2 diabetes mellitus with hyperglycemia: Secondary | ICD-10-CM

## 2020-10-21 ENCOUNTER — Ambulatory Visit: Payer: Self-pay | Admitting: Emergency Medicine

## 2020-10-21 ENCOUNTER — Ambulatory Visit (INDEPENDENT_AMBULATORY_CARE_PROVIDER_SITE_OTHER): Payer: BC Managed Care – PPO | Admitting: Emergency Medicine

## 2020-10-21 ENCOUNTER — Encounter: Payer: Self-pay | Admitting: Emergency Medicine

## 2020-10-21 ENCOUNTER — Other Ambulatory Visit: Payer: Self-pay

## 2020-10-21 VITALS — BP 122/82 | HR 88 | Temp 98.7°F | Wt 240.0 lb

## 2020-10-21 DIAGNOSIS — E1159 Type 2 diabetes mellitus with other circulatory complications: Secondary | ICD-10-CM

## 2020-10-21 DIAGNOSIS — I152 Hypertension secondary to endocrine disorders: Secondary | ICD-10-CM | POA: Diagnosis not present

## 2020-10-21 DIAGNOSIS — E1165 Type 2 diabetes mellitus with hyperglycemia: Secondary | ICD-10-CM

## 2020-10-21 LAB — POCT GLYCOSYLATED HEMOGLOBIN (HGB A1C): Hemoglobin A1C: 8.2 % — AB (ref 4.0–5.6)

## 2020-10-21 MED ORDER — OZEMPIC (0.25 OR 0.5 MG/DOSE) 2 MG/1.5ML ~~LOC~~ SOPN: 0.5000 mg | PEN_INJECTOR | SUBCUTANEOUS | 5 refills | Status: DC

## 2020-10-21 NOTE — Patient Instructions (Signed)
Continue metformin 1000 mg twice a day Continue Farxiga 5 mg daily Decrease glipizide to once a day Start Ozempic 0.5 mg weekly  Diabetes mellitus y nutricin, en adultos Diabetes Mellitus and Nutrition, Adult Si sufre de diabetes, o diabetes mellitus, es muy importante tener hbitos alimenticios saludables debido a que sus niveles de Designer, television/film set sangre (glucosa) se ven afectados en gran medida por lo que come y bebe. [place] alimentos saludables en las cantidades correctas, aproximadamente a la misma hora todos los [place], [place] ayudar a:  Aeronautical engineer glucemia.  Disminuir el riesgo de sufrir una enfermedad cardaca.  Mejorar la presin arterial.  Science writer o mantener un peso saludable. Qu puede afectar mi plan de alimentacin? Todas las personas que sufren de diabetes son diferentes y cada una tiene necesidades diferentes en cuanto a un plan de alimentacin. El mdico puede recomendarle que trabaje con un nutricionista para elaborar el mejor plan para usted. Su plan de alimentacin puede variar segn factores como:  Las caloras que necesita.  Los medicamentos que toma.  Su peso.  Sus niveles de glucemia, presin arterial y colesterol.  Su nivel de [place].  Otras afecciones que tenga, como enfermedades cardacas o renales. Cmo me afectan los carbohidratos? Los carbohidratos, o hidratos de carbono, afectan su nivel de glucemia ms que cualquier otro tipo de alimento. La ingesta de carbohidratos naturalmente aumenta la cantidad de [employer]. El recuento de carbohidratos es un mtodo destinado a Catering manager un registro de la cantidad de carbohidratos que se consumen. El recuento de carbohidratos es importante para Theatre manager la glucemia a un nivel saludable, especialmente si utiliza insulina o toma determinados medicamentos por va oral para la diabetes. Es importante conocer la cantidad de carbohidratos que se pueden ingerir en cada comida sin correr Engineer, manufacturing. Esto es  Psychologist, forensic. Su nutricionista puede ayudarlo a calcular la cantidad de carbohidratos que debe ingerir en cada comida y en cada refrigerio. Cmo me afecta el alcohol? El alcohol puede provocar disminuciones sbitas de la glucemia (hipoglucemia), especialmente si utiliza insulina o toma determinados medicamentos por va oral para la diabetes. La hipoglucemia es una afeccin potencialmente mortal. Los sntomas de la hipoglucemia, como somnolencia, mareos y confusin, son similares a los sntomas de haber consumido demasiado alcohol.  No beba alcohol si: ? Su mdico le indica no hacerlo. ? Est embarazada, puede estar embarazada o est tratando de quedar embarazada.  Si bebe alcohol: ? No beba con el estmago vaco. ? Limite la cantidad que bebe:  De 0 a 1 medida por da para las mujeres.  De 0 a 2 medidas por da para los hombres. ? Est atento a la cantidad de alcohol que hay en las bebidas que toma. En los [place], una medida equivale a una botella de cerveza de 12oz (349ml), un vaso de vino de 5oz (123ml) o un vaso de una bebida alcohlica de alta graduacin de 1oz (58ml). ? Mantngase hidratado bebiendo agua, refrescos dietticos o t helado sin azcar.  Tenga en cuenta que los refrescos comunes, los jugos y otras bebida para Optician, dispensing pueden contener mucha azcar y se deben contar como carbohidratos. Consejos para seguir Photographer las etiquetas de los alimentos  Comience por leer el tamao de la porcin en la "Informacin nutricional" en las etiquetas de los alimentos envasados y las bebidas. La cantidad de caloras, carbohidratos, grasas y otros nutrientes mencionados en la etiqueta se basan en una porcin del alimento. Muchos alimentos contienen ms de  una porcin por envase.  Verifique la cantidad total de gramos (g) de carbohidratos totales en una porcin. Puede calcular la cantidad de porciones de carbohidratos al dividir el total de carbohidratos por  15. Por ejemplo, si un alimento tiene un total de 30g de carbohidratos totales por porcin, equivale a 2 porciones de carbohidratos.  Verifique la cantidad de gramos (g) de grasas saturadas y grasas trans de una porcin. Escoja alimentos que no contengan estas grasas o que su contenido de estas sea [place].  Verifique la cantidad de miligramos (mg) de sal (sodio) en una porcin. [place] [employer] de las personas deben limitar la ingesta de sodio total a menos de 2300mg  por Training and development officer.  Siempre consulte la informacin nutricional de los alimentos etiquetados como "con bajo contenido de grasa" o "sin grasa". Estos alimentos pueden tener un mayor contenido de Location manager agregada o carbohidratos refinados, y deben evitarse.  Hable con su nutricionista para identificar sus objetivos diarios en cuanto a los nutrientes mencionados en la etiqueta. Al ir de compras  Evite comprar alimentos procesados, enlatados o precocidos. Estos alimentos tienden a Special educational needs teacher mayor cantidad de [place], sodio y azcar agregada.  Compre en la zona exterior de la tienda de comestibles. Esta es la zona donde se encuentran con mayor frecuencia las frutas y las verduras frescas, los cereales a granel, las carnes frescas y los productos lcteos frescos. Al cocinar  Utilice mtodos de coccin a baja temperatura, como hornear, en lugar de mtodos de coccin a alta temperatura, como frer en abundante aceite.  Cocine con aceites saludables, como el aceite de [place], canola o [place].  Evite cocinar con manteca, crema o carnes con alto contenido de grasa. Planificacin de las comidas  Coma las comidas y los refrigerios regularmente, preferentemente a la misma hora todos [place]. Evite pasar largos perodos de tiempo sin comer.  Consuma alimentos ricos en fibra, como frutas frescas, verduras, frijoles y cereales integrales. Consulte a su nutricionista sobre cuntas porciones de carbohidratos puede consumir en cada comida.  Consuma entre 4 y 6  onzas (entre 112 y 168g) de protenas magras por da, como carnes magras, pollo, pescado, huevos o tofu. Una onza (oz) de protena magra equivale a: ? 1 onza (28g) de carne, pollo o pescado. ? 1huevo. ?  de taza (62 g) de tofu.  Coma algunos alimentos por da que contengan grasas saludables, como aguacates, frutos secos, semillas y pescado.   Qu alimentos debo comer? Lambert Mody Bayas. Manzanas. Naranjas. Duraznos. Damascos. Ciruelas. Uvas. Mango. Papaya. [place]. Kiwi. Cerezas. [place] Valeda Malm. [place]. Verduras de [employer], que incluyen col rizada, [place], hojas de [place] y de [place]. Remolachas. Coliflor. Repollo. Brcoli. Zanahorias. Judas verdes. Tomates. Pimientos. Cebollas. Pepinos. Coles de Bruselas. Granos Granos integrales, como panes, galletas, tortillas, cereales y pastas de salvado o integrales. Avena sin azcar. Quinua. Arroz integral o salvaje. Carnes y Psychiatric nurse. Carne de ave sin piel. Cortes magros de ave y carne de res. Tofu. Frutos secos. Semillas. Lcteos Productos lcteos sin grasa o con bajo contenido de [place], [place], yogur y [place]. Es posible que los productos que se enumeran ms [place] no constituyan una lista completa de los alimentos y las bebidas que puede tomar. Consulte a un nutricionista para obtener ms informacin. Qu alimentos debo evitar? Lambert Mody Frutas enlatadas al almbar. Verduras Verduras enlatadas. Verduras congeladas con mantequilla o salsa de crema. Granos Productos elaborados con [place] y [place], como panes, pastas, bocadillos y cereales. Evite todos los alimentos procesados. Carnes y [place]  protenas Cortes de carne con alto contenido de [place]. Carne de ave con piel. Carnes empanizadas o fritas. Carne procesada. Evite las grasas saturadas. Lcteos Yogur, [place] enteros. Bebidas Bebidas azucaradas, como gaseosas o t helado. Es posible que los productos que se enumeran ms [place] no constituyan  una lista completa de los alimentos y las bebidas que Nurse, adult. Consulte a un nutricionista para obtener ms informacin. Preguntas para hacerle al mdico  Es necesario que me rena con Radio broadcast assistant en el cuidado de la diabetes?  Es necesario que me rena con un nutricionista?  A qu nmero puedo llamar si tengo preguntas?  Cules son los mejores momentos para controlar la glucemia? Dnde encontrar ms informacin:  Asociacin Estadounidense de la Diabetes (American Diabetes Association): diabetes.org  Academy of Nutrition and Dietetics (Academia de Nutricin y Information systems manager): www.eatright.[employer] of Diabetes and Digestive and Kidney Diseases ([place] la Diabetes y [place] y [place]): [employer]inesFuneral.dk  Association of Diabetes Care and Education Specialists (Asociacin de Especialistas en Atencin y Educacin sobre la Diabetes): www.diabeteseducator.org Resumen  Es importante tener hbitos alimenticios saludables debido a que sus niveles de Designer, television/film set sangre (glucosa) se ven afectados en gran medida por lo que come y bebe.  Un plan de alimentacin saludable lo ayudar a controlar la glucemia y Theatre manager un estilo de vida saludable.  El mdico puede recomendarle que trabaje con un nutricionista para elaborar el mejor plan para usted.  Tenga en cuenta que los carbohidratos (hidratos de carbono) y el alcohol tienen efectos inmediatos en sus niveles de glucemia. Es importante contar los carbohidratos que ingiere y consumir alcohol con prudencia. Esta informacin no tiene Marine scientist el consejo del mdico. Asegrese de hacerle al mdico cualquier pregunta que tenga. Document Revised: 06/22/2019 Document Reviewed: 06/22/2019 Elsevier Patient Education  2021 [employer].

## 2020-10-21 NOTE — Assessment & Plan Note (Signed)
Well-controlled hypertension.  Continue Zestoretic 10-12.5 mg daily. Improved diabetes with improved A1c at 8.2. Diet and nutrition discussed. Continue metformin 1000 mg twice a day, and Farxiga 5 mg daily. We will start Ozempic weekly if covered by insurance and decrease glipizide to once a day. Continue atorvastatin 40 mg daily. Follow-up in 3 months.

## 2020-10-21 NOTE — Progress Notes (Signed)
Jackie Wells 50 y.o.   Chief Complaint  Patient presents with  . Diabetes    3 month check on diabetes. Pt needs a refill of lipitor, farxiga,glucotrol, zestoretic.   ASSESSMENT & PLAN: Hypertension associated with diabetes ([place]) Well-controlled hypertension.  Continue present medication. Uncontrolled diabetes with hemoglobin A1c of 9.9.  Patient noncompliant with diet and nutrition.  Continue glipizide 10 mg twice a day and Metformin 1000 mg twice a day. Took Trulicity in the past but no longer covered by insurance. Start Farxiga 5 mg once a day.  May not be covered by patient's insurance. Diet and nutrition discussed. Continue atorvastatin 40 mg daily. Follow-up in 3 months.  HISTORY OF PRESENT ILLNESS: This is a 50 y.o. female with history of diabetes here for follow-up. Presently on metformin 1000 mg twice a day, glipizide 10 mg twice a day, and Farxiga 5 mg daily. Eating better.  Compliant with medications. Also taking Zestoretic 10-12.5 mg daily and atorvastatin 40 mg daily. Also requesting a work note to allow her frequent bathroom breaks. No other complaints or medical concerns today.  HPI   Prior to Admission medications   Medication Sig Start Date End Date Taking? Authorizing Provider  atorvastatin (LIPITOR) 40 MG tablet Take 1 tablet (40 mg total) by mouth daily. 07/26/20  Yes Najat Olazabal, Ines Bloomer, MD  blood glucose meter kit and supplies KIT Per insurance preference. Check blood glucose once a day. E11.9 03/14/19  Yes Jacelyn Pi, Lilia Argue, MD  dapagliflozin propanediol (FARXIGA) 5 MG TABS tablet Take 1 tablet (5 mg total) by mouth daily before breakfast. 07/26/20 10/24/20 Yes Bhavika Schnider, Ines Bloomer, MD  glipiZIDE (GLUCOTROL) 10 MG tablet TAKE ONE TABLET BY MOUTH TWICE A DAY BEFORE A MEAL 07/26/20  Yes Jing Howatt, Ines Bloomer, MD  lisinopril-hydrochlorothiazide (ZESTORETIC) 10-12.5 MG tablet Take 1 tablet by mouth daily. 07/26/20  Yes Horald Pollen, MD   metFORMIN (GLUCOPHAGE) 1000 MG tablet TAKE ONE TABLET BY MOUTH TWICE A DAY WITH A MEAL 09/18/20  Yes Dorien Mayotte, Ines Bloomer, MD  meloxicam (MOBIC) 15 MG tablet Take 1 tablet (15 mg total) by mouth daily. Patient not taking: No sig reported 03/26/20 03/26/21  Trula Slade, DPM    No Known Allergies  Patient Active Problem List   Diagnosis Date Noted  . Varicose veins of left lower extremity with complications 56/81/2751  . Diabetes type 2, uncontrolled ([place]) 11/09/2014  . Morbid obesity ([place]) 08/04/2014  . Dyslipidemia associated with type 2 diabetes mellitus ([place]) 08/04/2014  . Hypertension associated with diabetes ([place]) 08/04/2014    Past Medical History:  Diagnosis Date  . Anemia   . Diabetes mellitus without complication ([place])   . Hyperlipidemia   . Hypertension     Past Surgical History:  Procedure Laterality Date  . LAPAROSCOPIC UNILATERAL SALPINGO OOPHERECTOMY      Social History   Socioeconomic History  . Marital status: Single    Spouse name: Not on file  . Number of children: Not on file  . Years of education: Not on file  . Highest education level: Not on file  Occupational History  . Not on file  Tobacco Use  . Smoking status: Never Smoker  . Smokeless tobacco: Never Used  Substance and Sexual Activity  . Alcohol use: No    Alcohol/week: 0.0 standard drinks  . Drug use: No  . Sexual activity: Yes  Other Topics Concern  . Not on file  Social History Narrative  . Not on file  Social Determinants of Health   Financial Resource Strain: Not on file  Food Insecurity: Not on file  Transportation Needs: Not on file  Physical Activity: Not on file  Stress: Not on file  Social Connections: Not on file  Intimate Partner Violence: Not on file    Family History  Problem Relation Age of Onset  . Hyperlipidemia Mother   . Hypertension Mother   . Hypertension Father      Review of Systems  Constitutional: Negative.  Negative for chills and  fever.  HENT: Negative.  Negative for congestion and sore throat.   Respiratory: Negative.  Negative for cough and shortness of breath.   Cardiovascular: Negative.  Negative for chest pain and palpitations.  Gastrointestinal: Negative for abdominal pain, diarrhea, nausea and vomiting.  Genitourinary: Negative.  Negative for dysuria and hematuria.  Skin: Negative.  Negative for rash.  Neurological: Negative.  Negative for dizziness and headaches.  All other systems reviewed and are negative.  Today's Vitals   10/21/20 1707  BP: 122/82  Pulse: 88  Temp: 98.7 F (37.1 C)  SpO2: 96%  Weight: 240 lb (108.9 kg)   Body mass index is 39.94 kg/m.    Wt Readings from Last 3 Encounters:  07/23/20 239 lb (108.4 kg)  04/22/20 236 lb 3.2 oz (107.1 kg)  12/14/19 223 lb 6.4 oz (101.3 kg)     Physical Exam Vitals reviewed.  Constitutional:      Appearance: Normal appearance.  HENT:     Head: Normocephalic.  Eyes:     Extraocular Movements: Extraocular movements intact.     Conjunctiva/sclera: Conjunctivae normal.     Pupils: Pupils are equal, round, and reactive to light.  Cardiovascular:     Rate and Rhythm: Normal rate and regular rhythm.     Pulses: Normal pulses.     Heart sounds: Normal heart sounds.  Pulmonary:     Effort: Pulmonary effort is normal.     Breath sounds: Normal breath sounds.  Musculoskeletal:        General: Normal range of motion.     Cervical back: Normal range of motion and neck supple.  Skin:    General: Skin is warm and dry.     Capillary Refill: Capillary refill takes less than 2 seconds.  Neurological:     General: No focal deficit present.     Mental Status: She is alert and oriented to person, place, and time.  Psychiatric:        Mood and Affect: Mood normal.        Behavior: Behavior normal.     Results for orders placed or performed in visit on 10/21/20 (from the past 24 hour(s))  POCT glycosylated hemoglobin (Hb A1C)     Status:  Abnormal   Collection Time: 10/21/20  4:20 PM  Result Value Ref Range   Hemoglobin A1C 8.2 (A) 4.0 - 5.6 %   HbA1c POC (<> result, manual entry)     HbA1c, POC (prediabetic range)     HbA1c, POC (controlled diabetic range)      ASSESSMENT & PLAN: A total of 30 minutes was spent with the patient and counseling/coordination of care regarding diabetes and hypertension and cardiovascular risks associated with these conditions, review of all medications and changes made, education on nutrition, review of most recent blood work results including today's hemoglobin A1c, review of most recent office visit notes, prognosis, documentation and need for follow-up.  Hypertension associated with diabetes ([place]) Well-controlled hypertension.  Continue  Zestoretic 10-12.5 mg daily. Improved diabetes with improved A1c at 8.2. Diet and nutrition discussed. Continue metformin 1000 mg twice a day, and Farxiga 5 mg daily. We will start Ozempic weekly if covered by insurance and decrease glipizide to once a day. Continue atorvastatin 40 mg daily. Follow-up in 3 months.   [place] was seen today for diabetes.  Diagnoses and all orders for this visit:  Uncontrolled type 2 diabetes mellitus with hyperglycemia ([place]) -     POCT glycosylated hemoglobin (Hb A1C) -     Semaglutide,0.25 or 0.5MG/DOS, (OZEMPIC, 0.25 OR 0.5 MG/DOSE,) 2 MG/1.5ML SOPN; Inject 0.5 mg into the skin once a week.  Hypertension associated with diabetes ([place])    Patient Instructions   Continue metformin 1000 mg twice a day Continue Farxiga 5 mg daily Decrease glipizide to once a day Start Ozempic 0.5 mg weekly  Diabetes mellitus y nutricin, en adultos Diabetes Mellitus and Nutrition, Adult Si sufre de diabetes, o diabetes mellitus, es muy importante tener hbitos alimenticios saludables debido a que sus niveles de Designer, television/film set sangre (glucosa) se ven afectados en gran medida por lo que come y bebe. [place] alimentos saludables en  las cantidades correctas, aproximadamente a la misma hora todos los [place], [place] ayudar a:  Aeronautical engineer glucemia.  Disminuir el riesgo de sufrir una enfermedad cardaca.  Mejorar la presin arterial.  Science writer o mantener un peso saludable. Qu puede afectar mi plan de alimentacin? Todas las personas que sufren de diabetes son diferentes y cada una tiene necesidades diferentes en cuanto a un plan de alimentacin. El mdico puede recomendarle que trabaje con un nutricionista para elaborar el mejor plan para usted. Su plan de alimentacin puede variar segn factores como:  Las caloras que necesita.  Los medicamentos que toma.  Su peso.  Sus niveles de glucemia, presin arterial y colesterol.  Su nivel de [place].  Otras afecciones que tenga, como enfermedades cardacas o renales. Cmo me afectan los carbohidratos? Los carbohidratos, o hidratos de carbono, afectan su nivel de glucemia ms que cualquier otro tipo de alimento. La ingesta de carbohidratos naturalmente aumenta la cantidad de [employer]. El recuento de carbohidratos es un mtodo destinado a Catering manager un registro de la cantidad de carbohidratos que se consumen. El recuento de carbohidratos es importante para Theatre manager la glucemia a un nivel saludable, especialmente si utiliza insulina o toma determinados medicamentos por va oral para la diabetes. Es importante conocer la cantidad de carbohidratos que se pueden ingerir en cada comida sin correr Engineer, manufacturing. Esto es Psychologist, forensic. Su nutricionista puede ayudarlo a calcular la cantidad de carbohidratos que debe ingerir en cada comida y en cada refrigerio. Cmo me afecta el alcohol? El alcohol puede provocar disminuciones sbitas de la glucemia (hipoglucemia), especialmente si utiliza insulina o toma determinados medicamentos por va oral para la diabetes. La hipoglucemia es una afeccin potencialmente mortal. Los sntomas de la hipoglucemia, como somnolencia,  mareos y confusin, son similares a los sntomas de haber consumido demasiado alcohol.  No beba alcohol si: ? Su mdico le indica no hacerlo. ? Est embarazada, puede estar embarazada o est tratando de quedar embarazada.  Si bebe alcohol: ? No beba con el estmago vaco. ? Limite la cantidad que bebe:  De 0 a 1 medida por da para las mujeres.  De 0 a 2 medidas por da para los hombres. ? Est atento a la cantidad de alcohol que hay en las bebidas que toma. En los [place], una  medida equivale a una botella de cerveza de 12oz (349m), un vaso de vino de 5oz (1455m o un vaso de una bebida alcohlica de alta graduacin de 1oz (4481m ? Mantngase hidratado bebiendo agua, refrescos dietticos o t helado sin azcar.  Tenga en cuenta que los refrescos comunes, los jugos y otras bebida para mezOptician, dispensingeden contener mucha azcar y se deben contar como carbohidratos. Consejos para seguir estPhotographers etiquetas de los alimentos  Comience por leer el tamao de la porcin en la "Informacin nutricional" en las etiquetas de los alimentos envasados y las bebidas. La cantidad de caloras, carbohidratos, grasas y otros nutrientes mencionados en la etiqueta se basan en una porcin del alimento. Muchos alimentos contienen ms de una porcin por envase.  Verifique la cantidad total de gramos (g) de carbohidratos totales en una porcin. Puede calcular la cantidad de porciones de carbohidratos al dividir el total de carbohidratos por 15. Por ejemplo, si un alimento tiene un total de 30g de carbohidratos totales por porcin, equivale a 2 porciones de carbohidratos.  Verifique la cantidad de gramos (g) de grasas saturadas y grasas trans de una porcin. Escoja alimentos que no contengan estas grasas o que su contenido de estas sea bajHenderson PointVerifique la cantidad de miligramos (mg) de sal (sodio) en una porcin. La mayState Farm las personas deben limitar la ingesta de sodio total a menos de  2300m51mr da. Training and development officeriempre consulte la informacin nutricional de los alimentos etiquetados como "con bajo contenido de grasa" o "sin grasa". Estos alimentos pueden tener un mayor contenido de azcaLocation manageregada o carbohidratos refinados, y deben evitarse.  Hable con su nutricionista para identificar sus objetivos diarios en cuanto a los nutrientes mencionados en la etiqueta. Al ir de compras  Evite comprar alimentos procesados, enlatados o precocidos. Estos alimentos tienden a teneSpecial educational needs teacheror cantidad de grasGramlingdio y azcar agregada.  Compre en la zona exterior de la tienda de comestibles. Esta es la zona donde se encuentran con mayor frecuencia las frutas y las verduras frescas, los cereales a granel, las carnes frescas y los productos lcteos frescos. Al cocinar  Utilice mtodos de coccin a baja temperatura, como hornear, en lugar de mtodos de coccin a alta temperatura, como frer en abundante aceite.  Cocine con aceites saludables, como el aceite de olivFieldingnola o giraLewisvillevite cocinar con manteca, crema o carnes con alto contenido de grasa. Planificacin de las comidas  Coma las comidas y los refrigerios regularmente, preferentemente a la misma hora todos los [place]ton Heightsite pasar largos perodos de tiempo sin comer.  Consuma alimentos ricos en fibra, como frutas frescas, verduras, frijoles y cereales integrales. Consulte a su nutricionista sobre cuntas porciones de carbohidratos puede consumir en cada comida.  Consuma entre 4 y 6 onzas (entre 112 y 168g) de protenas magras por da, como carnes magras, pollo, pescado, huevos o tofu. Una onza (oz) de protena magra equivale a: ? 1 onza (28g) de carne, pollo o pescado. ? 1huevo. ?  de taza (62 g) de tofu.  Coma algunos alimentos por da que contengan grasas saludables, como aguacates, frutos secos, semillas y pescado.   Qu alimentos debo comer? FrutLambert Modyas. Manzanas. Naranjas. Duraznos. Damascos. Ciruelas. Uvas. Mango.  Papaya. GranJamulwi. Cerezas. VerdHolland CommonshValeda Malmpinaca. Verduras de hojaBoeinge incluyen col rizada, acelLoup Cityjas de berzIraqe mostCricketmolachas. Coliflor. Repollo. Brcoli. Zanahorias. Judas verdes. Tomates. Pimientos. Cebollas. Pepinos. Coles de Bruselas. Granos Granos integrales, como panes, galletas, tortillas, cereales y  pastas de salvado o integrales. Avena sin azcar. Quinua. Arroz integral o salvaje. Carnes y Psychiatric nurse. Carne de ave sin piel. Cortes magros de ave y carne de res. Tofu. Frutos secos. Semillas. Lcteos Productos lcteos sin grasa o con bajo contenido de [place], [place], yogur y [place]. Es posible que los productos que se enumeran ms [place] no constituyan una lista completa de los alimentos y las bebidas que puede tomar. Consulte a un nutricionista para obtener ms informacin. Qu alimentos debo evitar? Lambert Mody Frutas enlatadas al almbar. Verduras Verduras enlatadas. Verduras congeladas con mantequilla o salsa de crema. Granos Productos elaborados con [place] y [place], como panes, pastas, bocadillos y cereales. Evite todos los alimentos procesados. Carnes y otras protenas Cortes de carne con alto contenido de Lobbyist. Carne de ave con piel. Carnes empanizadas o fritas. Carne procesada. Evite las grasas saturadas. Lcteos Yogur, [place] enteros. Bebidas Bebidas azucaradas, como gaseosas o t helado. Es posible que los productos que se enumeran ms [place] no constituyan una lista completa de los alimentos y las bebidas que Nurse, adult. Consulte a un nutricionista para obtener ms informacin. Preguntas para hacerle al mdico  Es necesario que me rena con Radio broadcast assistant en el cuidado de la diabetes?  Es necesario que me rena con un nutricionista?  A qu nmero puedo llamar si tengo preguntas?  Cules son los mejores momentos para controlar la glucemia? Dnde encontrar ms informacin:  Asociacin  Estadounidense de la Diabetes (American Diabetes Association): diabetes.org  Academy of Nutrition and Dietetics (Academia de Nutricin y Information systems manager): www.eatright.[employer] of Diabetes and Digestive and Kidney Diseases ([place] la Diabetes y [place] y [place]): DesMoinesFuneral.dk  Association of Diabetes Care and Education Specialists (Asociacin de Especialistas en Atencin y Educacin sobre la Diabetes): www.diabeteseducator.org Resumen  Es importante tener hbitos alimenticios saludables debido a que sus niveles de Designer, television/film set sangre (glucosa) se ven afectados en gran medida por lo que come y bebe.  Un plan de alimentacin saludable lo ayudar a controlar la glucemia y Theatre manager un estilo de vida saludable.  El mdico puede recomendarle que trabaje con un nutricionista para elaborar el mejor plan para usted.  Tenga en cuenta que los carbohidratos (hidratos de carbono) y el alcohol tienen efectos inmediatos en sus niveles de glucemia. Es importante contar los carbohidratos que ingiere y consumir alcohol con prudencia. Esta informacin no tiene Marine scientist el consejo del mdico. Asegrese de hacerle al mdico cualquier pregunta que tenga. Document Revised: 06/22/2019 Document Reviewed: 06/22/2019 Elsevier Patient Education  2021 Prentiss, MD [facility]

## 2021-01-18 ENCOUNTER — Other Ambulatory Visit: Payer: Self-pay | Admitting: Emergency Medicine

## 2021-01-18 DIAGNOSIS — E1165 Type 2 diabetes mellitus with hyperglycemia: Secondary | ICD-10-CM

## 2021-03-11 ENCOUNTER — Other Ambulatory Visit: Payer: Self-pay

## 2021-03-11 ENCOUNTER — Encounter: Payer: Self-pay | Admitting: Emergency Medicine

## 2021-03-11 ENCOUNTER — Ambulatory Visit: Payer: BC Managed Care – PPO | Admitting: Emergency Medicine

## 2021-03-11 VITALS — BP 122/80 | HR 90 | Temp 98.2°F | Ht 65.0 in | Wt 229.0 lb

## 2021-03-11 DIAGNOSIS — E1165 Type 2 diabetes mellitus with hyperglycemia: Secondary | ICD-10-CM | POA: Diagnosis not present

## 2021-03-11 DIAGNOSIS — E1159 Type 2 diabetes mellitus with other circulatory complications: Secondary | ICD-10-CM

## 2021-03-11 DIAGNOSIS — Z7689 Persons encountering health services in other specified circumstances: Secondary | ICD-10-CM | POA: Diagnosis not present

## 2021-03-11 DIAGNOSIS — I152 Hypertension secondary to endocrine disorders: Secondary | ICD-10-CM

## 2021-03-11 DIAGNOSIS — Z1231 Encounter for screening mammogram for malignant neoplasm of breast: Secondary | ICD-10-CM

## 2021-03-11 DIAGNOSIS — E1169 Type 2 diabetes mellitus with other specified complication: Secondary | ICD-10-CM

## 2021-03-11 DIAGNOSIS — E785 Hyperlipidemia, unspecified: Secondary | ICD-10-CM

## 2021-03-11 LAB — POCT GLYCOSYLATED HEMOGLOBIN (HGB A1C): Hemoglobin A1C: 6.7 % — AB (ref 4.0–5.6)

## 2021-03-11 NOTE — Patient Instructions (Signed)
Diabetes mellitus y nutricin, en adultos Diabetes Mellitus and Nutrition, Adult Si sufre de diabetes, o diabetes mellitus, es muy importante tener hbitos alimenticios saludables debido a que sus niveles de Designer, television/film set sangre (glucosa) se ven afectados en gran medida por lo que come y bebe. Comer alimentos saludables en las cantidades correctas, aproximadamente a la misma hora todos los [place], [place] ayudar a: Aeronautical engineer glucemia. Disminuir el riesgo de sufrir una enfermedad cardaca. Mejorar la presin arterial. Science writer o mantener un peso saludable. Qu puede afectar mi plan de alimentacin? Todas las personas que sufren de diabetes son diferentes y cada una tiene necesidades diferentes en cuanto a un plan de alimentacin. El mdico puede recomendarle que trabaje con un nutricionista para elaborar el mejor plan para usted. Su plan de alimentacin puede variar segn factores como: Las caloras que necesita. Los medicamentos que toma. Su peso. Sus niveles de glucemia, presin arterial y colesterol. Su nivel de [place]. Otras afecciones que tenga, como enfermedades cardacas o renales. Cmo me afectan los carbohidratos? Los carbohidratos, o hidratos de carbono, afectan su nivel de glucemia ms que cualquier otro tipo de alimento. La ingesta de carbohidratos naturalmente aumenta la cantidad de [employer]. El recuento de carbohidratos es un mtodo destinado a Catering manager un registro de la cantidad de carbohidratos que se consumen. El recuento de carbohidratos es importante para Theatre manager la glucemia a un nivel saludable, especialmente si utiliza insulina o toma determinados medicamentos por va oral para la diabetes. Es importante conocer la cantidad de carbohidratos que se pueden ingerir en cada comida sin correr Engineer, manufacturing. Esto es Psychologist, forensic. Su nutricionista puede ayudarlo a calcular la cantidad de carbohidratos que debe ingerir en cada comida y en cada refrigerio. Cmo  me afecta el alcohol? El alcohol puede provocar disminuciones sbitas de la glucemia (hipoglucemia), especialmente si utiliza insulina o toma determinados medicamentos por va oral para la diabetes. La hipoglucemia es una afeccin potencialmente mortal. Los sntomas de la hipoglucemia, como somnolencia, mareos y confusin, son similares a los sntomas de haber consumido demasiado alcohol. No beba alcohol si: Su mdico le indica no hacerlo. Est embarazada, puede estar embarazada o est tratando de [place]. Si bebe alcohol: No beba con el estmago vaco. Limite la cantidad que bebe: De 0 a 1 medida por da para las mujeres. De 0 a 2 medidas por da para los hombres. Est atento a la cantidad de alcohol que hay en las bebidas que toma. En los [place], una medida equivale a una botella de cerveza de 12 oz (355 ml), un vaso de vino de 5 oz (148 ml) o un vaso de una bebida alcohlica de alta graduacin de 1 oz (44 ml). Mantngase hidratado bebiendo agua, refrescos dietticos o t helado sin azcar. Tenga en cuenta que los refrescos comunes, los jugos y otras bebida para Optician, dispensing pueden contener mucha azcar y se deben contar como carbohidratos. Consejos para seguir Photographer las etiquetas de los alimentos Comience por leer el tamao de la porcin en la "Informacin nutricional" en las etiquetas de los alimentos envasados y las bebidas. La cantidad de caloras, carbohidratos, grasas y otros nutrientes mencionados en la etiqueta se basan en una porcin del alimento. Muchos alimentos contienen ms de una porcin por envase. Verifique la cantidad total de gramos (g) de carbohidratos totales en una porcin. Puede calcular la cantidad de porciones de carbohidratos al dividir el total de carbohidratos por 15. Por ejemplo, si un alimento tiene un  total de 30 g de carbohidratos totales por porcin, equivale a 2 porciones de carbohidratos. Verifique la cantidad de gramos (g) de grasas  saturadas y grasas trans de una porcin. Escoja alimentos que no contengan estas grasas o que su contenido de estas sea [place]. Verifique la cantidad de miligramos (mg) de sal (sodio) en una porcin. [place] [employer] de las personas deben limitar la ingesta de sodio total a menos de 2300 mg [employer]. Siempre consulte la informacin nutricional de los alimentos etiquetados como "con bajo contenido de grasa" o "sin grasa". Estos alimentos pueden tener un mayor contenido de Location manager agregada o carbohidratos refinados, y deben evitarse. Hable con su nutricionista para identificar sus objetivos diarios en cuanto a los nutrientes mencionados en la etiqueta. Al ir de compras Evite comprar alimentos procesados, enlatados o precocidos. Estos alimentos tienden a Special educational needs teacher mayor cantidad de [place], sodio y azcar agregada. Compre en la zona exterior de la tienda de comestibles. Esta es la zona donde se encuentran con mayor frecuencia las frutas y las verduras frescas, los cereales a granel, las carnes frescas y los productos lcteos frescos. Al cocinar Utilice mtodos de coccin a baja temperatura, como hornear, en lugar de mtodos de coccin a alta temperatura, como frer en abundante aceite. Cocine con aceites saludables, como el aceite de [place], canola o [place]. Evite cocinar con manteca, crema o carnes con alto contenido de grasa. Planificacin de las comidas Coma las comidas y los refrigerios regularmente, preferentemente a la misma hora todos [place]. Evite pasar largos perodos de tiempo sin comer. Consuma alimentos ricos en fibra, como frutas frescas, verduras, frijoles y cereales integrales. Consulte a su nutricionista sobre cuntas porciones de carbohidratos puede consumir en cada comida. Consuma entre 4 y 6 onzas (entre 112 y 168 g) de protenas magras por da, como carnes [place], pollo, pescado, huevos o tofu. Una onza (oz) de protena magra equivale a: 1 onza (28 g) de carne, pollo o pescado. 1 huevo.  de  taza (62 g) de tofu. Coma algunos alimentos por da que contengan grasas saludables, como aguacates, frutos secos, semillas y pescado. Qu alimentos debo comer? Lambert Mody Bayas. Manzanas. Naranjas. Duraznos. Damascos. Ciruelas. Uvas. Mango. Papaya. [place]. Kiwi. Cerezas. [place] Valeda Malm. [place]. Verduras de [employer], que incluyen col rizada, [place], hojas de [place] y de [place]. Remolachas. Coliflor. Repollo. Brcoli. Zanahorias. Judas verdes. Tomates. Pimientos. Cebollas. Pepinos. Coles de Bruselas. Granos Granos integrales, como panes, galletas, tortillas, cereales y pastas de salvado o integrales. Avena sin azcar. Quinua. Arroz integral o salvaje. Carnes y Psychiatric nurse. Carne de ave sin piel. Cortes magros de ave y carne de res. Tofu. Frutos secos. Semillas. Lcteos Productos lcteos sin grasa o con bajo contenido de [place], [place], yogur y [place]. Es posible que los productos que se enumeran ms [place] no constituyan una lista completa de los alimentos y las bebidas que puede tomar. Consulte a un nutricionista para obtener ms informacin. Qu alimentos debo evitar? Lambert Mody Frutas enlatadas al almbar. Verduras Verduras enlatadas. Verduras congeladas con mantequilla o salsa de crema. Granos Productos elaborados con [place] y [place], como panes, pastas, bocadillos y cereales. Evite todos los alimentos procesados. Carnes y otras protenas Cortes de carne con alto contenido de Lobbyist. Carne de ave con piel. Carnes empanizadas o fritas. Carne procesada. Evite las grasas saturadas. Lcteos Yogur, [place] enteros. Bebidas Bebidas azucaradas, como gaseosas o t helado. Es posible que los productos que se enumeran ms [place] no constituyan una lista [place] de  los alimentos y las bebidas que debe evitar. Consulte a un nutricionista para obtener ms informacin. Preguntas para hacerle al mdico Es necesario que me rena con Radio broadcast assistant en el cuidado  de la diabetes? Es necesario que me rena con un nutricionista? A qu nmero puedo llamar si tengo preguntas? Cules son los mejores momentos para controlar la glucemia? Dnde encontrar ms informacin: Asociacin Estadounidense de la Diabetes (American Diabetes Association): diabetes.org Academy of Nutrition and Dietetics (Academia de Nutricin y Information systems manager): www.eatright.[employer]ys Corporation of Diabetes and Digestive and Kidney Diseases ([place] la Diabetes y [place] y [place]): [place].dk Association of Diabetes Care and Education Specialists (Asociacin de Especialistas en Atencin y Educacin sobre la Diabetes): www.diabeteseducator.org Resumen Es importante tener hbitos alimenticios saludables debido a que sus niveles de Designer, television/film set sangre (glucosa) se ven afectados en gran medida por lo que come y bebe. Un plan de alimentacin saludable lo ayudar a controlar la glucemia y Theatre manager un estilo de vida saludable. El mdico puede recomendarle que trabaje con un nutricionista para elaborar el mejor plan para usted. Tenga en cuenta que los carbohidratos (hidratos de carbono) y el alcohol tienen efectos inmediatos en sus niveles de glucemia. Es importante contar los carbohidratos que ingiere y consumir alcohol con prudencia. Esta informacin no tiene Marine scientist el consejo del mdico. Asegrese de hacerle al mdico cualquier pregunta que tenga. Document Revised: 06/22/2019 Document Reviewed: 06/22/2019 Elsevier Patient Education  2021 [employer].

## 2021-03-11 NOTE — Assessment & Plan Note (Signed)
Diet and nutrition discussed. Continue atorvastatin 40 mg daily. 

## 2021-03-11 NOTE — Progress Notes (Signed)
[place] 50 y.o.   Chief Complaint  Patient presents with   Diabetes    Follow up, med refill on test strips and lancets, pt states pharmacy is charging her for glucose supplies  Last office visit assessment and plan as follows: Hypertension associated with diabetes ([place]) Well-controlled hypertension.  Continue Zestoretic 10-12.5 mg daily. Improved diabetes with improved A1c at 8.2. Diet and nutrition discussed. Continue metformin 1000 mg twice a day, and Farxiga 5 mg daily. We will start Ozempic weekly if covered by insurance and decrease glipizide to once a day. Continue atorvastatin 40 mg daily. Follow-up in 3 months.  HISTORY OF PRESENT ILLNESS: This is a 50 y.o. female with history of diabetes and hypertension here for follow-up. #1 diabetes: On metformin 1000 mg twice a day, glipizide 10 mg, Farxiga 5 mg daily and weekly Ozempic #2 hypertension: On Zestoretic 10-12.5 mg daily. #3 dyslipidemia: On atorvastatin 40 mg daily. Has no complaints or medical concerns today.   Diabetes Pertinent negatives for hypoglycemia include no dizziness or headaches. Pertinent negatives for diabetes include no chest pain.    Prior to Admission medications   Medication Sig Start Date End Date Taking? Authorizing Provider  atorvastatin (LIPITOR) 40 MG tablet Take 1 tablet (40 mg total) by mouth daily. 07/26/20  Yes Jaidy Cottam, Ines Bloomer, MD  blood glucose meter kit and supplies KIT Per insurance preference. Check blood glucose once a day. E11.9 03/14/19  Yes Jacelyn Pi, Lilia Argue, MD  glipiZIDE (GLUCOTROL) 10 MG tablet TAKE ONE TABLET BY MOUTH TWICE A DAY BEFORE A MEAL 01/18/21  Yes Shaianne Nucci, Ines Bloomer, MD  lisinopril-hydrochlorothiazide (ZESTORETIC) 10-12.5 MG tablet Take 1 tablet by mouth daily. 07/26/20  Yes Sindi Beckworth, Ines Bloomer, MD  metFORMIN (GLUCOPHAGE) 1000 MG tablet TAKE ONE TABLET BY MOUTH TWICE A DAY WITH A MEAL 09/18/20  Yes Gwenyth Dingee, Ines Bloomer, MD  Semaglutide,0.25  or 0.5MG/DOS, (OZEMPIC, 0.25 OR 0.5 MG/DOSE,) 2 MG/1.5ML SOPN Inject 0.5 mg into the skin once a week. 10/21/20  Yes Horald Pollen, MD    No Known Allergies  Patient Active Problem List   Diagnosis Date Noted   Varicose veins of left lower extremity with complications 53/97/6734   Diabetes type 2, uncontrolled 11/09/2014   Morbid obesity ([place]) 08/04/2014   Dyslipidemia associated with type 2 diabetes mellitus ([place]) 08/04/2014   Hypertension associated with diabetes ([place]) 08/04/2014    Past Medical History:  Diagnosis Date   Anemia    Diabetes mellitus without complication ([place])    Hyperlipidemia    Hypertension     Past Surgical History:  Procedure Laterality Date   LAPAROSCOPIC UNILATERAL SALPINGO OOPHERECTOMY      Social History   Socioeconomic History   Marital status: Single    Spouse name: Not on file   Number of children: Not on file   Years of education: Not on file   Highest education level: Not on file  Occupational History   Not on file  Tobacco Use   Smoking status: Never   Smokeless tobacco: Never  Substance and Sexual Activity   Alcohol use: No    Alcohol/week: 0.0 standard drinks   Drug use: No   Sexual activity: Yes  Other Topics Concern   Not on file  Social History Narrative   Not on file   Social Determinants of Health   Financial Resource Strain: Not on file  Food Insecurity: Not on file  Transportation Needs: Not on file  Physical Activity: Not on file  Stress: Not on file  Social Connections: Not on file  Intimate Partner Violence: Not on file    Family History  Problem Relation Age of Onset   Hyperlipidemia Mother    Hypertension Mother    Hypertension Father      Review of Systems  Constitutional: Negative.  Negative for chills and fever.  HENT: Negative.  Negative for congestion and sore throat.   Respiratory: Negative.  Negative for cough and shortness of breath.   Cardiovascular: Negative.  Negative for chest  pain and palpitations.  Gastrointestinal: Negative.  Negative for abdominal pain, diarrhea, nausea and vomiting.  Genitourinary: Negative.  Negative for dysuria and flank pain.  Skin: Negative.  Negative for rash.  Neurological: Negative.  Negative for dizziness and headaches.  All other systems reviewed and are negative.  Today's Vitals   03/11/21 1315  BP: 122/80  Pulse: 90  Temp: 98.2 F (36.8 C)  TempSrc: Oral  SpO2: 95%  Weight: 229 lb (103.9 kg)  Height: 5' 5"  (1.651 m)   Body mass index is 38.11 kg/m.  Physical Exam Vitals reviewed.  Constitutional:      Appearance: Normal appearance. She is obese.  HENT:     Head: Normocephalic.  Eyes:     Extraocular Movements: Extraocular movements intact.     Pupils: Pupils are equal, round, and reactive to light.  Cardiovascular:     Rate and Rhythm: Normal rate and regular rhythm.     Pulses: Normal pulses.     Heart sounds: Normal heart sounds.  Pulmonary:     Effort: Pulmonary effort is normal.     Breath sounds: Normal breath sounds.  Musculoskeletal:        General: Normal range of motion.     Cervical back: Normal range of motion and neck supple.  Skin:    General: Skin is warm and dry.     Capillary Refill: Capillary refill takes less than 2 seconds.  Neurological:     General: No focal deficit present.     Mental Status: She is alert and oriented to person, place, and time.  Psychiatric:        Mood and Affect: Mood normal.        Behavior: Behavior normal.    Results for orders placed or performed in visit on 03/11/21 (from the past 24 hour(s))  POCT glycosylated hemoglobin (Hb A1C)     Status: Abnormal   Collection Time: 03/11/21  1:26 PM  Result Value Ref Range   Hemoglobin A1C 6.7 (A) 4.0 - 5.6 %   HbA1c POC (<> result, manual entry)     HbA1c, POC (prediabetic range)     HbA1c, POC (controlled diabetic range)      ASSESSMENT & PLAN: Problem List Items Addressed This Visit       Cardiovascular  and Mediastinum   Hypertension associated with diabetes ([place])    Well-controlled hypertension continue Zestoretic 10-12.5 mg daily. BP Readings from Last 3 Encounters:  03/11/21 122/80  10/21/20 122/82  07/23/20 91/64  Much improved diabetes with hemoglobin A1c of 6.7. Continue weekly Ozempic 0.5 mg, metformin 1000 mg twice a day, glipizide 10 mg daily and Farxiga 5 mg daily. Follow-up in 3 months.         Endocrine   Dyslipidemia associated with type 2 diabetes mellitus ([place])    Diet and nutrition discussed.  Continue atorvastatin 40 mg daily.       Diabetes type 2, uncontrolled - Primary   Relevant  Orders   POCT glycosylated hemoglobin (Hb A1C) (Completed)     Other   Morbid obesity ([place])    Diet and nutrition discussed.  Needs to exercise more and decrease amount of daily carbohydrate intake.      Other Visit Diagnoses     Encounter for screening mammogram for malignant neoplasm of breast       Relevant Orders   MM Digital Screening   Encounter to establish care       Relevant Orders   Ambulatory referral to Gynecology      Patient Instructions  Diabetes mellitus y nutricin, en adultos Diabetes Mellitus and Nutrition, Adult Si sufre de diabetes, o diabetes mellitus, es muy importante tener hbitos alimenticios saludables debido a que sus niveles de Designer, television/film set sangre (glucosa) se ven afectados en gran medida por lo que come y bebe. [place] alimentos saludables en las cantidades correctas, aproximadamente a la misma hora todos los [place], [place] ayudar a: Aeronautical engineer glucemia. Disminuir el riesgo de sufrir una enfermedad cardaca. Mejorar la presin arterial. Science writer o mantener un peso saludable. Qu puede afectar mi plan de alimentacin? Todas las personas que sufren de diabetes son diferentes y cada una tiene necesidades diferentes en cuanto a un plan de alimentacin. El mdico puede recomendarle que trabaje con un nutricionista para elaborar el mejor plan para  usted. Su plan de alimentacin puede variar segn factores como: Las caloras que necesita. Los medicamentos que toma. Su peso. Sus niveles de glucemia, presin arterial y colesterol. Su nivel de [place]. Otras afecciones que tenga, como enfermedades cardacas o renales. Cmo me afectan los carbohidratos? Los carbohidratos, o hidratos de carbono, afectan su nivel de glucemia ms que cualquier otro tipo de alimento. La ingesta de carbohidratos naturalmente aumenta la cantidad de [employer]. El recuento de carbohidratos es un mtodo destinado a Catering manager un registro de la cantidad de carbohidratos que se consumen. El recuento de carbohidratos es importante para Theatre manager la glucemia a un nivel saludable, especialmente si utiliza insulina o toma determinados medicamentos por va oral para la diabetes. Es importante conocer la cantidad de carbohidratos que se pueden ingerir en cada comida sin correr Engineer, manufacturing. Esto es Psychologist, forensic. Su nutricionista puede ayudarlo a calcular la cantidad de carbohidratos que debe ingerir en cada comida y en cada refrigerio. Cmo me afecta el alcohol? El alcohol puede provocar disminuciones sbitas de la glucemia (hipoglucemia), especialmente si utiliza insulina o toma determinados medicamentos por va oral para la diabetes. La hipoglucemia es una afeccin potencialmente mortal. Los sntomas de la hipoglucemia, como somnolencia, mareos y confusin, son similares a los sntomas de haber consumido demasiado alcohol. No beba alcohol si: Su mdico le indica no hacerlo. Est embarazada, puede estar embarazada o est tratando de [place]. Si bebe alcohol: No beba con el estmago vaco. Limite la cantidad que bebe: De 0 a 1 medida por da para las mujeres. De 0 a 2 medidas por da para los hombres. Est atento a la cantidad de alcohol que hay en las bebidas que toma. En los [place], una medida equivale a una botella de cerveza de 12  oz (355 ml), un vaso de vino de 5 oz (148 ml) o un vaso de una bebida alcohlica de alta graduacin de 1 oz (44 ml). Mantngase hidratado bebiendo agua, refrescos dietticos o t helado sin azcar. Tenga en cuenta que los refrescos comunes, los jugos y otras bebida para Optician, dispensing pueden contener Freight forwarder y se deben  contar como carbohidratos. Consejos para seguir Photographer las etiquetas de los alimentos Comience por leer el tamao de la porcin en la "Informacin nutricional" en las etiquetas de los alimentos envasados y las bebidas. La cantidad de caloras, carbohidratos, grasas y otros nutrientes mencionados en la etiqueta se basan en una porcin del alimento. Muchos alimentos contienen ms de una porcin por envase. Verifique la cantidad total de gramos (g) de carbohidratos totales en una porcin. Puede calcular la cantidad de porciones de carbohidratos al dividir el total de carbohidratos por 15. Por ejemplo, si un alimento tiene un total de 30 g de carbohidratos totales por porcin, equivale a 2 porciones de carbohidratos. Verifique la cantidad de gramos (g) de grasas saturadas y grasas trans de una porcin. Escoja alimentos que no contengan estas grasas o que su contenido de estas sea [place]. Verifique la cantidad de miligramos (mg) de sal (sodio) en una porcin. La [employer] de las personas deben limitar la ingesta de sodio total a menos de 2300 mg Honeywell. Siempre consulte la informacin nutricional de los alimentos etiquetados como "con bajo contenido de grasa" o "sin grasa". Estos alimentos pueden tener un mayor contenido de Location manager agregada o carbohidratos refinados, y deben evitarse. Hable con su nutricionista para identificar sus objetivos diarios en cuanto a los nutrientes mencionados en la etiqueta. Al ir de compras Evite comprar alimentos procesados, enlatados o precocidos. Estos alimentos tienden a Special educational needs teacher mayor cantidad de [place], sodio y azcar agregada. Compre en la zona exterior  de la tienda de comestibles. Esta es la zona donde se encuentran con mayor frecuencia las frutas y las verduras frescas, los cereales a granel, las carnes frescas y los productos lcteos frescos. Al cocinar Utilice mtodos de coccin a baja temperatura, como hornear, en lugar de mtodos de coccin a alta temperatura, como frer en abundante aceite. Cocine con aceites saludables, como el aceite de [place], canola o [place]. Evite cocinar con manteca, crema o carnes con alto contenido de grasa. Planificacin de las comidas Coma las comidas y los refrigerios regularmente, preferentemente a la misma hora todos [place]. Evite pasar largos perodos de tiempo sin comer. Consuma alimentos ricos en fibra, como frutas frescas, verduras, frijoles y cereales integrales. Consulte a su nutricionista sobre cuntas porciones de carbohidratos puede consumir en cada comida. Consuma entre 4 y 6 onzas (entre 112 y 168 g) de protenas magras por da, como carnes [place], pollo, pescado, huevos o tofu. Una onza (oz) de protena magra equivale a: 1 onza (28 g) de carne, pollo o pescado. 1 huevo.  de taza (62 g) de tofu. Coma algunos alimentos por da que contengan grasas saludables, como aguacates, frutos secos, semillas y pescado. Qu alimentos debo comer? Lambert Mody Bayas. Manzanas. Naranjas. Duraznos. Damascos. Ciruelas. Uvas. Mango. Papaya. [place]. Kiwi. Cerezas. [place] Valeda Malm. [place]. Verduras de Boeing, que incluyen col rizada, [place], hojas de [place] y de [place]. Remolachas. Coliflor. Repollo. Brcoli. Zanahorias. Judas verdes. Tomates. Pimientos. Cebollas. Pepinos. Coles de Bruselas. Granos Granos integrales, como panes, galletas, tortillas, cereales y pastas de salvado o integrales. Avena sin azcar. Quinua. Arroz integral o salvaje. Carnes y Psychiatric nurse. Carne de ave sin piel. Cortes magros de ave y carne de res. Tofu. Frutos secos. Semillas. Lcteos Productos lcteos sin grasa o con  bajo contenido de [place], [place], yogur y [place]. Es posible que los productos que se enumeran ms [place] no constituyan una lista completa de los alimentos y las bebidas que puede tomar. Consulte a un nutricionista  para obtener ms informacin. Qu alimentos debo evitar? Lambert Mody Frutas enlatadas al almbar. Verduras Verduras enlatadas. Verduras congeladas con mantequilla o salsa de crema. Granos Productos elaborados con [place] y [place], como panes, pastas, bocadillos y cereales. Evite todos los alimentos procesados. Carnes y otras protenas Cortes de carne con alto contenido de Lobbyist. Carne de ave con piel. Carnes empanizadas o fritas. Carne procesada. Evite las grasas saturadas. Lcteos Yogur, [place] enteros. Bebidas Bebidas azucaradas, como gaseosas o t helado. Es posible que los productos que se enumeran ms [place] no constituyan una lista completa de los alimentos y las bebidas que Nurse, adult. Consulte a un nutricionista para obtener ms informacin. Preguntas para hacerle al mdico Es necesario que me rena con Radio broadcast assistant en el cuidado de la diabetes? Es necesario que me rena con un nutricionista? A qu nmero puedo llamar si tengo preguntas? Cules son los mejores momentos para controlar la glucemia? Dnde encontrar ms informacin: Asociacin Estadounidense de la Diabetes (American Diabetes Association): diabetes.org Academy of Nutrition and Dietetics (Academia de Nutricin y Information systems manager): www.eatright.Unisys Corporation of Diabetes and Digestive and Kidney Diseases ([place] la Diabetes y [place] y [place]): DesMoinesFuneral.dk Association of Diabetes Care and Education Specialists (Asociacin de Especialistas en Atencin y Educacin sobre la Diabetes): www.diabeteseducator.org Resumen Es importante tener hbitos alimenticios saludables debido a que sus niveles de Designer, television/film set sangre (glucosa) se ven afectados en  gran medida por lo que come y bebe. Un plan de alimentacin saludable lo ayudar a controlar la glucemia y Theatre manager un estilo de vida saludable. El mdico puede recomendarle que trabaje con un nutricionista para elaborar el mejor plan para usted. Tenga en cuenta que los carbohidratos (hidratos de carbono) y el alcohol tienen efectos inmediatos en sus niveles de glucemia. Es importante contar los carbohidratos que ingiere y consumir alcohol con prudencia. Esta informacin no tiene Marine scientist el consejo del mdico. Asegrese de hacerle al mdico cualquier pregunta que tenga. Document Revised: 06/22/2019 Document Reviewed: 06/22/2019 Elsevier Patient Education  2021 [place], MD [facility]

## 2021-03-11 NOTE — Assessment & Plan Note (Addendum)
Well-controlled hypertension continue Zestoretic 10-12.5 mg daily. BP Readings from Last 3 Encounters:  03/11/21 122/80  10/21/20 122/82  07/23/20 91/64  Much improved diabetes with hemoglobin A1c of 6.7. Continue weekly Ozempic 0.5 mg, metformin 1000 mg twice a day, glipizide 10 mg daily and Farxiga 5 mg daily. Follow-up in 3 months.

## 2021-03-11 NOTE — Assessment & Plan Note (Signed)
Diet and nutrition discussed.  Needs to exercise more and decrease amount of daily carbohydrate intake.

## 2021-04-15 ENCOUNTER — Other Ambulatory Visit: Payer: Self-pay

## 2021-04-15 ENCOUNTER — Ambulatory Visit
Admission: RE | Admit: 2021-04-15 | Discharge: 2021-04-15 | Disposition: A | Discharge status: Home / Self Care | Payer: BC Managed Care – PPO | Source: Ambulatory Visit | Attending: Emergency Medicine | Admitting: Emergency Medicine

## 2021-04-15 DIAGNOSIS — Z1231 Encounter for screening mammogram for malignant neoplasm of breast: Secondary | ICD-10-CM

## 2021-04-17 ENCOUNTER — Ambulatory Visit: Payer: BC Managed Care – PPO | Admitting: Obstetrics and Gynecology

## 2021-04-17 NOTE — Progress Notes (Incomplete)
Establish care Last Pap 2018  Mammogram 04/15/21

## 2021-04-25 ENCOUNTER — Other Ambulatory Visit: Payer: Self-pay | Admitting: Emergency Medicine

## 2021-04-25 DIAGNOSIS — E1165 Type 2 diabetes mellitus with hyperglycemia: Secondary | ICD-10-CM

## 2021-06-19 ENCOUNTER — Other Ambulatory Visit: Payer: Self-pay

## 2021-06-19 ENCOUNTER — Ambulatory Visit: Payer: BC Managed Care – PPO | Admitting: Emergency Medicine

## 2021-06-19 ENCOUNTER — Encounter: Payer: Self-pay | Admitting: Emergency Medicine

## 2021-06-19 VITALS — BP 122/86 | HR 90 | Ht 65.0 in | Wt 226.0 lb

## 2021-06-19 DIAGNOSIS — R102 Pelvic and perineal pain: Secondary | ICD-10-CM | POA: Diagnosis not present

## 2021-06-19 DIAGNOSIS — I152 Hypertension secondary to endocrine disorders: Secondary | ICD-10-CM | POA: Diagnosis not present

## 2021-06-19 DIAGNOSIS — E785 Hyperlipidemia, unspecified: Secondary | ICD-10-CM

## 2021-06-19 DIAGNOSIS — E1169 Type 2 diabetes mellitus with other specified complication: Secondary | ICD-10-CM | POA: Diagnosis not present

## 2021-06-19 DIAGNOSIS — Z8742 Personal history of other diseases of the female genital tract: Secondary | ICD-10-CM | POA: Diagnosis not present

## 2021-06-19 DIAGNOSIS — E1159 Type 2 diabetes mellitus with other circulatory complications: Secondary | ICD-10-CM | POA: Diagnosis not present

## 2021-06-19 LAB — POCT GLYCOSYLATED HEMOGLOBIN (HGB A1C): Hemoglobin A1C: 6.7 % — AB (ref 4.0–5.6)

## 2021-06-19 MED ORDER — OZEMPIC (1 MG/DOSE) 4 MG/3ML ~~LOC~~ SOPN: 1.0000 mg | PEN_INJECTOR | SUBCUTANEOUS | 7 refills | Status: DC

## 2021-06-19 NOTE — Assessment & Plan Note (Signed)
Well-controlled hypertension.  Continue Zestoretic 10-12.5 mg daily. BP Readings from Last 3 Encounters:  06/19/21 122/86  03/11/21 122/80  10/21/20 122/82  Well-controlled diabetes with hemoglobin A1c of 6.7 Lab Results  Component Value Date   HGBA1C 6.7 (A) 06/19/2021  Will increase Ozempic to 1 mg weekly.  Continue metformin 1000 mg twice a day. Continue Farxiga 5 mg daily.  We will discontinue glipizide. Diet and nutrition discussed. Follow-up in 3 to 6 months.

## 2021-06-19 NOTE — Progress Notes (Signed)
Jackie Wells 51 y.o.   Chief Complaint  Patient presents with   Diabetes    HISTORY OF PRESENT ILLNESS: This is a 51 y.o. female here for diabetes follow-up. Doing well.  Presently taking Ozempic 0.5 mg weekly, metformin 1000 mg twice a day, Farxiga 5 mg daily and glipizide 10 mg daily. Also complaining of left lower abdominal/pelvic pain for the past 2 weeks.  Daily pain that waxes and wanes.  No radiation. No menstrual period for the past 5 years.  Has history of ovarian cysts.  Right ovary removed about 20 years ago. Has occasional constipation.  Denies fever or chills.  Denies nausea or vomiting.  Able to eat and drink.  Denies vaginal discharge or abnormal bleeding. No other complaints or medical concerns today. Lab Results  Component Value Date   HGBA1C 6.7 (A) 03/11/2021     Diabetes Pertinent negatives for hypoglycemia include no dizziness or headaches. Pertinent negatives for diabetes include no chest pain.    Prior to Admission medications   Medication Sig Start Date End Date Taking? Authorizing Provider  atorvastatin (LIPITOR) 40 MG tablet Take 1 tablet (40 mg total) by mouth daily. 07/26/20  Yes Kang Ishida, Ines Bloomer, MD  blood glucose meter kit and supplies KIT Per insurance preference. Check blood glucose once a day. E11.9 03/14/19  Yes Jacelyn Pi, Lilia Argue, MD  glipiZIDE (GLUCOTROL) 10 MG tablet TAKE ONE TABLET BY MOUTH TWICE A DAY BEFORE A MEAL 01/18/21  Yes Shatoya Roets, Ines Bloomer, MD  lisinopril-hydrochlorothiazide (ZESTORETIC) 10-12.5 MG tablet Take 1 tablet by mouth daily. 07/26/20  Yes Tremaine Earwood, Ines Bloomer, MD  metFORMIN (GLUCOPHAGE) 1000 MG tablet TAKE ONE TABLET BY MOUTH TWICE A DAY WITH A MEAL 09/18/20  Yes Ourania Hamler, [place], MD  OZEMPIC, 0.25 OR 0.5 MG/DOSE, 2 MG/1.5ML SOPN INJECT 0.5 MG INTO THE SKIN ONCE A WEEK. 04/27/21  Yes Horald Pollen, MD    No Known Allergies  Patient Active Problem List   Diagnosis Date Noted   Varicose veins of  left lower extremity with complications 51/68/1275   Diabetes type 2, uncontrolled 11/09/2014   Morbid obesity ([place]) 08/04/2014   Dyslipidemia associated with type 2 diabetes mellitus ([place]) 08/04/2014   Hypertension associated with diabetes ([place]) 08/04/2014    Past Medical History:  Diagnosis Date   Anemia    Diabetes mellitus without complication ([place])    Hyperlipidemia    Hypertension     Past Surgical History:  Procedure Laterality Date   LAPAROSCOPIC UNILATERAL SALPINGO OOPHERECTOMY      Social History   Socioeconomic History   Marital status: Single    Spouse name: Not on file   Number of children: Not on file   Years of education: Not on file   Highest education level: Not on file  Occupational History   Not on file  Tobacco Use   Smoking status: Never   Smokeless tobacco: Never  Substance and Sexual Activity   Alcohol use: No    Alcohol/week: 0.0 standard drinks   Drug use: No   Sexual activity: Yes  Other Topics Concern   Not on file  Social History Narrative   Not on file   Social Determinants of Health   Financial Resource Strain: Not on file  Food Insecurity: Not on file  Transportation Needs: Not on file  Physical Activity: Not on file  Stress: Not on file  Social Connections: Not on file  Intimate Partner Violence: Not on file    Family History  Problem Relation Age of Onset   Hyperlipidemia Mother    Hypertension Mother    Hypertension Father      Review of Systems  Constitutional: Negative.  Negative for fever.  HENT: Negative.  Negative for congestion and sore throat.   Respiratory: Negative.  Negative for cough and shortness of breath.   Cardiovascular: Negative.  Negative for chest pain and palpitations.  Gastrointestinal:  Negative for abdominal pain, diarrhea, nausea and vomiting.  Genitourinary: Negative.  Negative for dysuria and hematuria.  Skin: Negative.  Negative for rash.  Neurological: Negative.  Negative for dizziness  and headaches.   Today's Vitals   06/19/21 1526  BP: 122/86  Pulse: 90  SpO2: 98%  Weight: 226 lb (102.5 kg)  Height: 5' 5"  (1.651 m)   Body mass index is 37.61 kg/m. Wt Readings from Last 3 Encounters:  06/19/21 226 lb (102.5 kg)  03/11/21 229 lb (103.9 kg)  10/21/20 240 lb (108.9 kg)    Physical Exam Vitals reviewed.  Constitutional:      Appearance: Normal appearance. She is obese.  HENT:     Head: Normocephalic.  Eyes:     Extraocular Movements: Extraocular movements intact.     Pupils: Pupils are equal, round, and reactive to light.  Cardiovascular:     Rate and Rhythm: Normal rate and regular rhythm.     Pulses: Normal pulses.     Heart sounds: Normal heart sounds.  Pulmonary:     Effort: Pulmonary effort is normal.     Breath sounds: Normal breath sounds.  Abdominal:     General: There is no distension.     Palpations: Abdomen is soft.     Tenderness: There is no abdominal tenderness.  Musculoskeletal:        General: Normal range of motion.     Cervical back: Neck supple. No tenderness.  Lymphadenopathy:     Cervical: No cervical adenopathy.  Skin:    General: Skin is warm and dry.     Capillary Refill: Capillary refill takes less than 2 seconds.  Neurological:     General: No focal deficit present.     Mental Status: She is alert and oriented to person, place, and time.  Psychiatric:        Mood and Affect: Mood normal.        Behavior: Behavior normal.    Results for orders placed or performed in visit on 06/19/21 (from the past 24 hour(s))  POCT glycosylated hemoglobin (Hb A1C)     Status: Abnormal   Collection Time: 06/19/21  3:28 PM  Result Value Ref Range   Hemoglobin A1C 6.7 (A) 4.0 - 5.6 %   HbA1c POC (<> result, manual entry)     HbA1c, POC (prediabetic range)     HbA1c, POC (controlled diabetic range)      ASSESSMENT & PLAN: A total of 45 minutes was spent with the patient and counseling/coordination of care regarding preparing for  this visit, review of most recent office visit notes, review of most recent blood work results including today's hemoglobin A1c, review of all medications and changes made, education on nutrition, cardiovascular risks associated with diabetes and hypertension, differential diagnosis of pelvic pain and need for pelvic ultrasound and gynecological evaluation, prognosis, and need for follow-up.   Problem List Items Addressed This Visit       Cardiovascular and Mediastinum   Hypertension associated with diabetes ([place]) - Primary    Well-controlled hypertension.  Continue Zestoretic 10-12.5 mg  daily. BP Readings from Last 3 Encounters:  06/19/21 122/86  03/11/21 122/80  10/21/20 122/82  Well-controlled diabetes with hemoglobin A1c of 6.7 Lab Results  Component Value Date   HGBA1C 6.7 (A) 06/19/2021  Will increase Ozempic to 1 mg weekly.  Continue metformin 1000 mg twice a day. Continue Farxiga 5 mg daily.  We will discontinue glipizide. Diet and nutrition discussed. Follow-up in 3 to 6 months.        Relevant Medications   FARXIGA 5 MG TABS tablet   Semaglutide, 1 MG/DOSE, (OZEMPIC, 1 MG/DOSE,) 4 MG/3ML SOPN   Other Relevant Orders   POCT glycosylated hemoglobin (Hb A1C) (Completed)     Endocrine   Dyslipidemia associated with type 2 diabetes mellitus (HCC)    Stable.  Diet and nutrition discussed.  Continue atorvastatin 40 mg daily.       Relevant Medications   FARXIGA 5 MG TABS tablet   Semaglutide, 1 MG/DOSE, (OZEMPIC, 1 MG/DOSE,) 4 MG/3ML SOPN     Other   Pelvic pain    Stable.  No red flag signs or symptoms.  Most likely secondary to left ovarian cysts. Needs pelvic ultrasound.  Needs gynecological evaluation. Advised to take Tylenol and or NSAIDs as needed.      Relevant Orders   US Pelvis Complete   Ambulatory referral to Gynecology   Other Visit Diagnoses     History of ovarian cyst       Relevant Orders   US Pelvis Complete   Ambulatory referral to  Gynecology      Patient Instructions  Dolor plvico en la mujer Pelvic Pain, Female El dolor plvico se siente en la parte inferior del vientre (abdomen), debajo del ombligo y a nivel de las caderas. El dolor puede tener las siguientes caractersticas: Medical laboratory scientific officer de repente (ser agudo). Volver a presentarse (ser recurrente). Durar mucho tiempo (volverse crnico). El dolor plvico que dura ms de 6 meses se denomina dolor plvico crnico. El dolor plvico puede tener muchas causas. A veces, la causa del dolor plvico no se conoce. Siga estas instrucciones en su casa:  Use los medicamentos de venta libre y los recetados solamente como se lo haya indicado el mdico. Haga reposo como se lo haya indicado el mdico. No tenga sexo si siente dolor. Lleve un registro del dolor plvico. Escriba los siguientes datos: Cundo comenz Conservation officer, historic buildings. La ubicacin del dolor. Qu parece mejorar o Occupational hygienist, como alimentos o su perodo mensual (ciclo menstrual). Cualquier sntoma que se presente junto con Conservation officer, historic buildings. Concurra a Mucarabones. Comunquese con un mdico si: El medicamento no [employer], o el dolor regresa. Aparecen nuevos sntomas. Tiene sangrado o secrecin inusual de la vagina. Tiene fiebre o escalofros. Tiene dificultad para defecar (estreimiento). Observa sangre en el pis (orina) o en la materia fecal (heces). El pis tiene mal olor. Se siente dbil o siente que va a desvanecerse. Solicite ayuda de inmediato si: Technical brewer repentino y Wellsite geologist. Siente un dolor muy intenso y tambin tiene alguno de estos sntomas: Cristy Hilts. Sensacin de que va a vomitar (nuseas). Vmitos. Tiene mucho sudor. Se desmaya. Estos sntomas pueden Sales executive. Solicite ayuda de inmediato. Comunquese con el servicio de emergencias de su localidad (911 en los [place]). No espere a ver si los sntomas desaparecen. No conduzca por sus propios medios  [employer]. Resumen El dolor plvico se siente en la parte inferior del vientre (abdomen), debajo del ombligo y a  nivel de las caderas. El dolor plvico puede tener muchas causas. Lleve un registro del dolor plvico. Esta informacin no tiene Marine scientist el consejo del mdico. Asegrese de hacerle al mdico cualquier pregunta que tenga. Document Revised: 10/17/2020 Document Reviewed: 10/17/2020 Elsevier Patient Education  2022 [place], MD [facility]

## 2021-06-19 NOTE — Assessment & Plan Note (Signed)
Stable.  No red flag signs or symptoms.  Most likely secondary to left ovarian cysts. Needs pelvic ultrasound.  Needs gynecological evaluation. Advised to take Tylenol and or NSAIDs as needed.

## 2021-06-19 NOTE — Patient Instructions (Signed)
Dolor plvico en la mujer Pelvic Pain, Female El dolor plvico se siente en la parte inferior del vientre (abdomen), debajo del ombligo y a nivel de las caderas. El dolor puede tener las siguientes caractersticas: Medical laboratory scientific officer de repente (ser agudo). Volver a presentarse (ser recurrente). Durar mucho tiempo (volverse crnico). El dolor plvico que dura ms de 6 meses se denomina dolor plvico crnico. El dolor plvico puede tener muchas causas. A veces, la causa del dolor plvico no se conoce. Siga estas instrucciones en su casa:  Use los medicamentos de venta libre y los recetados solamente como se lo haya indicado el mdico. Haga reposo como se lo haya indicado el mdico. No tenga sexo si siente dolor. Lleve un registro del dolor plvico. Escriba los siguientes datos: Cundo comenz Conservation officer, historic buildings. La ubicacin del dolor. Qu parece mejorar o Occupational hygienist, como alimentos o su perodo mensual (ciclo menstrual). Cualquier sntoma que se presente junto con Conservation officer, historic buildings. Concurra a [place]. Comunquese con un mdico si: El medicamento no [employer], o el dolor regresa. Aparecen nuevos sntomas. Tiene sangrado o secrecin inusual de la vagina. Tiene fiebre o escalofros. Tiene dificultad para defecar (estreimiento). Observa sangre en el pis (orina) o en la materia fecal (heces). El pis tiene mal olor. Se siente dbil o siente que va a desvanecerse. Solicite ayuda de inmediato si: Technical brewer repentino y Wellsite geologist. Siente un dolor muy intenso y tambin tiene alguno de estos sntomas: Cristy Hilts. Sensacin de que va a vomitar (nuseas). Vmitos. Tiene mucho sudor. Se desmaya. Estos sntomas pueden Sales executive. Solicite ayuda de inmediato. Comunquese con el servicio de emergencias de su localidad (911 en [place]). No espere a ver si los sntomas desaparecen. No conduzca por sus propios medios [employer]. Resumen El dolor plvico  se siente en la parte inferior del vientre (abdomen), debajo del ombligo y a nivel de las caderas. El dolor plvico puede tener muchas causas. Lleve un registro del dolor plvico. Esta informacin no tiene Marine scientist el consejo del mdico. Asegrese de hacerle al mdico cualquier pregunta que tenga. Document Revised: 10/17/2020 Document Reviewed: 10/17/2020 Elsevier Patient Education  [place].

## 2021-06-19 NOTE — Assessment & Plan Note (Signed)
Stable.  Diet and nutrition discussed. Continue atorvastatin 40 mg daily. 

## 2021-06-20 ENCOUNTER — Telehealth: Payer: Self-pay | Admitting: Emergency Medicine

## 2021-06-20 NOTE — Telephone Encounter (Signed)
Jackie Wells from [place] called stating she needs clarification on ultrasound trans abdominal or trans vaginal

## 2021-06-21 NOTE — Telephone Encounter (Signed)
Transabdominal please.  Thanks.

## 2021-06-22 ENCOUNTER — Other Ambulatory Visit: Payer: Self-pay | Admitting: Emergency Medicine

## 2021-06-22 DIAGNOSIS — E1165 Type 2 diabetes mellitus with hyperglycemia: Secondary | ICD-10-CM

## 2021-06-23 NOTE — Telephone Encounter (Signed)
Called the number provided for [place] at [facility], left a vm with that says they should do transabdominal.

## 2021-06-30 ENCOUNTER — Ambulatory Visit
Admission: RE | Admit: 2021-06-30 | Discharge: 2021-06-30 | Disposition: A | Discharge status: Home / Self Care | Payer: BC Managed Care – PPO | Source: Ambulatory Visit | Attending: Emergency Medicine | Admitting: Emergency Medicine

## 2021-06-30 DIAGNOSIS — R102 Pelvic and perineal pain: Secondary | ICD-10-CM

## 2021-06-30 DIAGNOSIS — Z8742 Personal history of other diseases of the female genital tract: Secondary | ICD-10-CM

## 2021-07-08 ENCOUNTER — Other Ambulatory Visit: Payer: Self-pay | Admitting: Emergency Medicine

## 2021-07-08 DIAGNOSIS — I10 Essential (primary) hypertension: Secondary | ICD-10-CM

## 2021-07-08 DIAGNOSIS — E782 Mixed hyperlipidemia: Secondary | ICD-10-CM

## 2021-07-22 ENCOUNTER — Other Ambulatory Visit: Payer: Self-pay | Admitting: Emergency Medicine

## 2021-07-22 DIAGNOSIS — E1165 Type 2 diabetes mellitus with hyperglycemia: Secondary | ICD-10-CM

## 2021-08-28 ENCOUNTER — Encounter: Payer: Self-pay | Admitting: Obstetrics

## 2021-08-28 ENCOUNTER — Ambulatory Visit: Payer: BC Managed Care – PPO | Admitting: Obstetrics

## 2021-08-28 ENCOUNTER — Other Ambulatory Visit (HOSPITAL_COMMUNITY)
Admission: RE | Admit: 2021-08-28 | Discharge: 2021-08-28 | Disposition: A | Discharge status: Home / Self Care | Payer: BC Managed Care – PPO | Source: Ambulatory Visit | Attending: Obstetrics | Admitting: Obstetrics

## 2021-08-28 VITALS — BP 121/73 | HR 84 | Wt 225.0 lb

## 2021-08-28 DIAGNOSIS — Z01419 Encounter for gynecological examination (general) (routine) without abnormal findings: Secondary | ICD-10-CM

## 2021-08-28 DIAGNOSIS — N898 Other specified noninflammatory disorders of vagina: Secondary | ICD-10-CM | POA: Diagnosis present

## 2021-08-28 DIAGNOSIS — E669 Obesity, unspecified: Secondary | ICD-10-CM

## 2021-08-28 DIAGNOSIS — Z1211 Encounter for screening for malignant neoplasm of colon: Secondary | ICD-10-CM | POA: Diagnosis not present

## 2021-08-28 DIAGNOSIS — Z113 Encounter for screening for infections with a predominantly sexual mode of transmission: Secondary | ICD-10-CM

## 2021-08-28 NOTE — Progress Notes (Signed)
? ?Subjective: ? ? ?  ?  ? Jackie Wells is a 51 y.o. female here for a routine exam.  Current complaints: Vaginal discharge.   ? ?Personal health questionnaire:  ?Is patient Ashkenazi Jewish, have a family history of breast and/or ovarian cancer: no ?Is there a family history of uterine cancer diagnosed at age < 5, gastrointestinal cancer, urinary tract cancer, family member who is a Field seismologist syndrome-associated carrier: no ?Is the patient overweight and hypertensive, family history of diabetes, personal history of gestational diabetes, preeclampsia or PCOS: no ?Is patient over 60, have PCOS,  family history of premature CHD under age 4, diabetes, smoke, have hypertension or peripheral artery disease:  no ?At any time, has a partner hit, kicked or otherwise hurt or frightened you?: no ?Over the past 2 weeks, have you felt down, depressed or hopeless?: no ?Over the past 2 weeks, have you felt little interest or pleasure in doing things?:no ? ? ?Gynecologic History ?No LMP recorded (lmp unknown). Patient is postmenopausal. ?Contraception: post menopausal status ?Last Pap: 2018. Results were: normal ?Last mammogram: 022. Results were: normal ? ?Obstetric History ?OB History  ?Gravida Para Term Preterm AB Living  ?_0 ?SAB IAB Ectopic Multiple Live Births  ?        2  ?  ?# Outcome Date GA Lbr Len/2nd Weight Sex Delivery Anes PTL Lv  ?2 Term      CS-LTranv     ?1 Term      CS-LTranv     ? ? ?Past Medical History:  ?Diagnosis Date  ? Anemia   ? Diabetes mellitus without complication ([place])   ? Hyperlipidemia   ? Hypertension   ?  ?Past Surgical History:  ?Procedure Laterality Date  ? LAPAROSCOPIC UNILATERAL SALPINGO OOPHERECTOMY    ?  ? ?Current Outpatient Medications:  ?  atorvastatin (LIPITOR) 40 MG tablet, TAKE 1 TABLET BY MOUTH EVERY DAY, Disp: 90 tablet, Rfl: 3 ?  blood glucose meter kit and supplies KIT, Per insurance preference. Check blood glucose once a day. E11.9, Disp: 1 each, Rfl: 5 ?  FARXIGA  5 MG TABS tablet, TAKE 1 TABLET BY MOUTH DAILY BEFORE BREAKFAST., Disp: 90 tablet, Rfl: 3 ?  glipiZIDE (GLUCOTROL) 10 MG tablet, TAKE ONE TABLET BY MOUTH TWICE A DAY BEFORE A MEAL, Disp: 180 tablet, Rfl: 1 ?  lisinopril-hydrochlorothiazide (ZESTORETIC) 10-12.5 MG tablet, TAKE 1 TABLET BY MOUTH EVERY DAY, Disp: 90 tablet, Rfl: 3 ?  metFORMIN (GLUCOPHAGE) 1000 MG tablet, TAKE ONE TABLET BY MOUTH TWICE A DAY WITH A MEAL, Disp: 180 tablet, Rfl: 2 ?  Semaglutide, 1 MG/DOSE, (OZEMPIC, 1 MG/DOSE,) 4 MG/3ML SOPN, Inject 1 mg into the skin once a week., Disp: 3 mL, Rfl: 7 ?No Known Allergies  ?Social History  ? ?Tobacco Use  ? Smoking status: Never  ? Smokeless tobacco: Never  ?Substance Use Topics  ? Alcohol use: No  ?  Alcohol/week: 0.0 standard drinks  ?  ?Family History  ?Problem Relation Age of Onset  ? Hyperlipidemia Mother   ? Hypertension Mother   ? Hypertension Father   ?  ? ? ?Review of Systems ? ?Constitutional: negative for fatigue and weight loss ?Respiratory: positive for seasonal allergic rhinitis.  negative for cough and wheezing ?Cardiovascular: negative for chest pain, fatigue and palpitations ?Gastrointestinal: negative for abdominal pain and change in bowel habits ?Musculoskeletal:negative for myalgias ?Neurological: negative for gait problems and tremors ?Behavioral/Psych: negative for abusive relationship, depression ?  Endocrine: negative for temperature intolerance    ?Genitourinary: positive for vaginal discharge.  negative for abnormal menstrual periods, genital lesions, hot flashes, sexual problems  ?Integument/breast: negative for breast lump, breast tenderness, nipple discharge and skin lesion(s) ? ?  ?Objective:  ? ?    ?BP 121/73   Pulse 84   Wt 225 lb (102.1 kg)   LMP  (LMP Unknown)   BMI 37.44 kg/m?  ?General:   Alert and no distress  ?Skin:   no rash or abnormalities  ?Lungs:   clear to auscultation bilaterally  ?Heart:   regular rate and rhythm, S1, S2 normal, no murmur, click, rub or  gallop  ?Breasts:   normal without suspicious masses, skin or nipple changes or axillary nodes  ?Abdomen:  normal findings: no organomegaly, soft, non-tender and no hernia  ?Pelvis:  External genitalia: normal general appearance ?Urinary system: urethral meatus normal and bladder without fullness, nontender ?Vaginal: normal without tenderness, induration or masses ?Cervix: normal appearance ?Adnexa: normal bimanual exam ?Uterus: anteverted and non-tender, normal size  ? ?Lab Review ?Urine pregnancy test ?Labs reviewed yes ?Radiologic studies reviewed yes ? ?I have spent a total of 20 minutes of face-to-face time, excluding clinical staff time, reviewing notes and preparing to see patient, ordering tests and/or medications, and counseling the patient.  ? ?Assessment:  ? ? 1. Encounter for gynecological examination with Papanicolaou smear of cervix ?Rx: ?- Cytology - PAP( [facility]) ? ?2. Vaginal discharge ?Rx: ?- Cervicovaginal ancillary only( [place]) ? ?3. Screening for STD (sexually transmitted disease) ?Rx: ?- HepB+HepC+HIV Panel ?- RPR ? ?4. Screening for colon cancer ?Rx: ?- Ambulatory referral to Gastroenterology ? ?5. Obesity (BMI 35.0-39.9 without comorbidity) ?- weight reduction with the aid of dietary changes, exercise and behavioral modification recommended ?  ?  ?Plan:  ? ? Education reviewed: calcium supplements, depression evaluation, low fat, low cholesterol diet, safe sex/STD prevention, self breast exams, and weight bearing exercise. ?Follow up in: 1 year. ? Colonoscopy ordered ? ?No orders of the defined types were placed in this encounter. ? ?Orders Placed This Encounter  ?Procedures  ? HepB+HepC+HIV Panel  ? RPR  ? Ambulatory referral to Gastroenterology  ?  Referral Priority:   Routine  ?  Referral Type:   Consultation  ?  Referral Reason:   Specialty Services Required  ?  Number of Visits Requested:   1  ? ? ? Shelly Bombard, MD ?08/28/2021 9:16 AM  ?

## 2021-08-28 NOTE — Progress Notes (Signed)
NGYN presents for annual and all STD testing. ?Pt reports vulva bump x 3 weeks.  ?Normal pap 05/28/17 ?Negative mammogram 04/15/21  ?Need colonoscopy ?PHQ9= 1 ?GAD7= 0 ?

## 2021-08-29 LAB — CERVICOVAGINAL ANCILLARY ONLY
Bacterial Vaginitis (gardnerella): NEGATIVE
Candida Glabrata: NEGATIVE
Candida Vaginitis: NEGATIVE
Chlamydia: NEGATIVE
Comment: NEGATIVE
Comment: NEGATIVE
Comment: NEGATIVE
Comment: NEGATIVE
Comment: NEGATIVE
Comment: NORMAL
Neisseria Gonorrhea: NEGATIVE
Trichomonas: NEGATIVE

## 2021-08-29 LAB — HEPB+HEPC+HIV PANEL
HIV Screen 4th Generation wRfx: NONREACTIVE
Hep B C IgM: NEGATIVE
Hep B Core Total Ab: NEGATIVE
Hep B E Ab: NEGATIVE
Hep B E Ag: NEGATIVE
Hep B Surface Ab, Qual: NONREACTIVE
Hep C Virus Ab: NONREACTIVE
Hepatitis B Surface Ag: NEGATIVE

## 2021-08-29 LAB — RPR: RPR Ser Ql: NONREACTIVE

## 2021-09-01 LAB — CYTOLOGY - PAP
Comment: NEGATIVE
Diagnosis: NEGATIVE
High risk HPV: NEGATIVE

## 2021-09-17 ENCOUNTER — Encounter: Payer: Self-pay | Admitting: Emergency Medicine

## 2021-09-17 ENCOUNTER — Ambulatory Visit: Payer: BC Managed Care – PPO | Admitting: Emergency Medicine

## 2021-09-17 VITALS — BP 110/64 | HR 85 | Temp 98.4°F | Ht 65.0 in | Wt 223.1 lb

## 2021-09-17 DIAGNOSIS — I152 Hypertension secondary to endocrine disorders: Secondary | ICD-10-CM | POA: Diagnosis not present

## 2021-09-17 DIAGNOSIS — Z1211 Encounter for screening for malignant neoplasm of colon: Secondary | ICD-10-CM

## 2021-09-17 DIAGNOSIS — E1169 Type 2 diabetes mellitus with other specified complication: Secondary | ICD-10-CM

## 2021-09-17 DIAGNOSIS — E1159 Type 2 diabetes mellitus with other circulatory complications: Secondary | ICD-10-CM | POA: Diagnosis not present

## 2021-09-17 DIAGNOSIS — E785 Hyperlipidemia, unspecified: Secondary | ICD-10-CM

## 2021-09-17 LAB — LIPID PANEL
Cholesterol: 101 mg/dL (ref 0–200)
HDL: 38.4 mg/dL — ABNORMAL LOW (ref >39.00)
LDL Cholesterol: 38 mg/dL (ref 0–99)
NonHDL: 62.34
Total CHOL/HDL Ratio: 3
Triglycerides: 120 mg/dL (ref 0.0–149.0)
VLDL: 24 mg/dL (ref 0.0–40.0)

## 2021-09-17 LAB — COMPREHENSIVE METABOLIC PANEL
ALT: 17 U/L (ref 0–35)
AST: 16 U/L (ref 0–37)
Albumin: 4.4 g/dL (ref 3.5–5.2)
Alkaline Phosphatase: 75 U/L (ref 39–117)
BUN: 21 mg/dL (ref 6–23)
CO2: 27 mEq/L (ref 19–32)
Calcium: 9.1 mg/dL (ref 8.4–10.5)
Chloride: 104 mEq/L (ref 96–112)
Creatinine, Ser: 0.95 mg/dL (ref 0.40–1.20)
GFR: 69.67 mL/min (ref >60.00)
Glucose, Bld: 95 mg/dL (ref 70–99)
Potassium: 4.1 mEq/L (ref 3.5–5.1)
Sodium: 139 mEq/L (ref 135–145)
Total Bilirubin: 0.4 mg/dL (ref 0.2–1.2)
Total Protein: 7.4 g/dL (ref 6.0–8.3)

## 2021-09-17 LAB — POCT GLYCOSYLATED HEMOGLOBIN (HGB A1C): Hemoglobin A1C: 6 % — AB (ref 4.0–5.6)

## 2021-09-17 LAB — MICROALBUMIN / CREATININE URINE RATIO
Creatinine,U: 78.3 mg/dL
Microalb Creat Ratio: 1.2 mg/g (ref 0.0–30.0)
Microalb, Ur: 0.9 mg/dL (ref 0.0–1.9)

## 2021-09-17 NOTE — Patient Instructions (Signed)
Continue Ozempic, metformin, and [place]. ?Stop glipizide. ?Diabetes mellitus y nutrici?n, en adultos ?Diabetes Mellitus and Nutrition, Adult ?Si sufre de diabetes, o diabetes mellitus, es muy importante tener h?bitos alimenticios saludables debido a que sus niveles de az?car en la sangre (glucosa) se ven afectados en gran medida por lo que come y bebe. [place] alimentos saludables en las cantidades correctas, aproximadamente a la misma hora todos los d?as, lo ayudar? a: ?Controlar su glucemia. ?Disminuir el riesgo de sufrir una enfermedad card?aca. ?Mejorar la presi?n arterial. ?Alcanzar o mantener un peso saludable. ??Qu? puede afectar mi plan de alimentaci?n? ?Todas las personas que sufren de diabetes son diferentes y cada una tiene necesidades diferentes en cuanto a un plan de alimentaci?n. El m?dico puede recomendarle que trabaje con un nutricionista para Financial trader plan para usted. Su plan de alimentaci?n puede variar seg?n factores como: ?Las calor?as que necesita. ?Los medicamentos que toma. ?Su peso. ?Sus niveles de glucemia, presi?n arterial y colesterol. ?Su nivel de [place]. ?Otras afecciones que tenga, como enfermedades card?acas o renales. ??C?mo me afectan los carbohidratos? ?Los carbohidratos, o hidratos de carbono, afectan su nivel de glucemia m?s que cualquier otro tipo de alimento. La ingesta de carbohidratos aumenta la cantidad de [employer]. ?Es importante conocer la cantidad de carbohidratos que se pueden ingerir en cada comida sin correr ning?n riesgo. Esto es Psychologist, forensic. Su nutricionista puede ayudarlo a calcular la cantidad de carbohidratos que debe ingerir en cada comida y en cada refrigerio. ??C?mo me afecta el alcohol? ?El alcohol puede provocar una disminuci?n de la glucemia (hipoglucemia), especialmente si [place] insulina o toma determinados medicamentos por v?a oral para la diabetes. La hipoglucemia es una afecci?n potencialmente mortal. Los s?ntomas de la  hipoglucemia, como somnolencia, mareos y confusi?n, son similares a los s?ntomas de haber consumido demasiado alcohol. ?No beba alcohol si: ?Su m?dico le indica no hacerlo. ?Est? embarazada, puede estar embarazada o est? tratando de quedar embarazada. ?Si bebe alcohol: ?Limite la cantidad que bebe a lo siguiente: ?De 0 a 1 medida por d?a para las mujeres. ?De 0 a 2 medidas por d?a para los hombres. ?Sepa cu?nta cantidad de alcohol hay en las bebidas que toma. En los [place], una medida equivale a una botella de cerveza de 12 oz (355 ml), un vaso de vino de 5 oz (148 ml) o un vaso de una bebida alcoh?lica de alta graduaci?n de 1? oz (44 ml). ?Mant?ngase hidratado bebiendo agua, refrescos diet?ticos o t? helado sin az?car. Tenga en cuenta que los refrescos comunes, los jugos y otras bebidas para mezclar pueden contener mucha az?car y se deben contar como carbohidratos. ?Consejos para seguir este plan ? ?Leer las etiquetas de los alimentos ?Comience por leer el tama?o de la porci?n en la etiqueta de Informaci?n nutricional de los alimentos envasados y las bebidas. La cantidad de calor?as, carbohidratos, grasas y otros nutrientes detallados en la etiqueta se basan en una porci?n del alimento. Muchos alimentos contienen m?s de una porci?n por envase. ?Verifique la cantidad total de gramos (g) de carbohidratos totales en una porci?n. ?Verifique la cantidad de gramos de grasas saturadas y grasas trans en una porci?n. Escoja alimentos que no contengan estas grasas o que su contenido de estas sea [place]. ?Verifique la cantidad de miligramos (mg) de sal (sodio) en una porci?n. La mayor?a de las personas deben limitar la ingesta de sodio total a menos de 2300 mg por d?a. ?Siempre consulte la informaci?n nutricional de los alimentos etiquetados como ?con  bajo contenido de grasa? o ?sin grasa?. Estos alimentos pueden tener un mayor contenido de az?car agregada o carbohidratos refinados, y deben evitarse. ?Hable con su  nutricionista para identificar sus objetivos diarios en cuanto a los nutrientes mencionados en la etiqueta. ?Al ir de compras ?Evite comprar alimentos procesados, enlatados o precocidos. Estos alimentos tienden a Special educational needs teacher mayor cantidad de [place], sodio y az?car agregada. ?Compre en la zona exterior de la tienda de comestibles. Esta es la zona donde se encuentran con mayor frecuencia las frutas y las verduras frescas, los cereales a granel, las carnes frescas y los productos l?cteos frescos. ?Al cocinar ?Use m?todos de cocci?n a baja temperatura, como hornear, en lugar de m?todos de cocci?n a alta temperatura, como fre?r en abundante aceite. ?Cocine con aceites saludables, como el aceite de [place], canola o [place]. ?Evite cocinar con manteca, crema o carnes con alto contenido de grasa. ?Planificaci?n de las comidas ?Coma las comidas y los refrigerios regularmente, preferentemente a la misma hora todos los d?as. Evite pasar largos per?odos de tiempo sin comer. ?Consuma alimentos ricos en fibra, como frutas frescas, verduras, frijoles y cereales integrales. ?Consuma entre 4 y 6 onzas (entre 112 y 168 g) de prote?nas magras por d?a, como carnes magras, pollo, pescado, huevos o tofu. Una onza (oz) (28 g) de prote?na magra equivale a: ?1 onza (28 g) de carne, pollo o pescado. ?1 huevo. ?? taza (62 g) de tofu. ?Coma algunos alimentos por d?a que contengan grasas saludables, como aguacates, frutos secos, semillas y pescado. ??Qu? alimentos debo comer? ?Lambert Mody ?Bayas. Manzanas. Naranjas. Duraznos. Damascos. Ciruelas. Uvas. Mangos. Papayas. Granadas. Kiwi. Cerezas. ?Verduras ?Verduras de [employer], que incluyen [place], [place], col rizada, acelga, hojas de berza, hojas de mostaza y repollo. Remolachas. Coliflor. Br?coli. Zanahorias. Jud?as verdes. Tomates. Pimientos. Cebollas. Pepinos. Coles de Bruselas. ?Granos ?Granos integrales, como panes, galletas, tortillas, cereales y pastas de salvado o integrales. Avena sin  az?car. Quinua. Arroz integral o salvaje. ?Carnes y otras prote?nas ?Frutos de mar. Carne de ave sin piel. Cortes magros de ave y carne de res. Tofu. Frutos secos. Semillas. ?L?cteos ?Productos l?cteos sin grasa o con bajo contenido de [place], como [place], yogur y [place]. ?Es posible que los productos detallados arriba no constituyan una lista completa de los alimentos y las bebidas que puede tomar. Consulte a un nutricionista para obtener m?s informaci?n. ??Qu? alimentos debo evitar? ?Lambert Mody ?Frutas enlatadas al alm?bar. ?Verduras ?Verduras enlatadas. Verduras congeladas con mantequilla o salsa de crema. ?Granos ?Productos elaborados con [place] y [place], como panes, pastas, bocadillos y cereales. Evite todos los alimentos procesados. ?Carnes y otras prote?nas ?Cortes de carne con alto contenido de [place]. Carne de ave con piel. Carnes empanizadas o fritas. Carne procesada. Evite las grasas saturadas. ?L?cteos ?Yogur, [place] ?Bebidas ?Bebidas azucaradas, como gaseosas o t? helado. ?Es posible que los productos que se enumeran m?s arriba no constituyan una lista completa de los alimentos y las bebidas que Nurse, adult. Consulte a un nutricionista para obtener m?s informaci?n. ?Preguntas para hacerle al m?dico ??Debo consultar con un especialista certificado en atenci?n y educaci?n sobre la diabetes? ??Es necesario que me re?na con un nutricionista? ??A qu? n?mero puedo llamar si tengo preguntas? ??Cu?les son los mejores momentos para controlar la glucemia? ?D?nde encontrar m?s informaci?n: ?American Diabetes Association (Asociaci?n Estadounidense de la Diabetes): diabetes.org ?Academy of Nutrition and Dietetics (Academia de Nutrici?n y Diet?tica): eatright.org ?[employer] of Diabetes and Digestive and Kidney Diseases ([place] Diabetes y [place]  Digestivas y Renales): AmenCredit.is ?Association of Diabetes Care & Education Specialists (Asociaci?n de  Especialistas en Atenci?n y Educaci?n sobre la Diabetes): diabeteseducator.org ?Resumen ?Es importante tener h?bitos alimenticios saludables debido a que sus niveles de az?car en la sangre (glucosa) se ven afectados en

## 2021-09-17 NOTE — Progress Notes (Signed)
Jackie Wells ?51 y.o. ? ? ?Chief Complaint  ?Patient presents with  ? Follow-up  ?  No concerns   ? ? ?HISTORY OF PRESENT ILLNESS: ?This is a 51 y.o. female here for 40-monthfollow-up of diabetes and hypertension. ?Has no complaints or medical concerns. ?Recently saw gynecologist and had a Pap smear.  No concerns. ?Last office visit assessment and plan as follows: ?  ?Cardiovascular and Mediastinum ?  ?Hypertension associated with diabetes ([place] - Primary ?  ?  Well-controlled hypertension.  Continue Zestoretic 10-12.5 mg daily. ?   ?BP Readings from Last 3 Encounters:  ?06/19/21 122/86  ?03/11/21 122/80  ?10/21/20 122/82  ?Well-controlled diabetes with hemoglobin A1c of 6.7 ?     ?Lab Results  ?Component Value Date  ?  HGBA1C 6.7 (A) 06/19/2021  ?Will increase Ozempic to 1 mg weekly.  Continue metformin 1000 mg twice a day. ?Continue Farxiga 5 mg daily.  We will discontinue glipizide. ?Diet and nutrition discussed. ?Follow-up in 3 to 6 months.  ? ? ? ?HPI ? ? ?Prior to Admission medications   ?Medication Sig Start Date End Date Taking? Authorizing Provider  ?atorvastatin (LIPITOR) 40 MG tablet TAKE 1 TABLET BY MOUTH EVERY DAY 07/08/21  Yes Momo Braun, MInes Bloomer MD  ?blood glucose meter kit and supplies KIT Per insurance preference. Check blood glucose once a day. E11.9 03/14/19  Yes SJacelyn Pi ILilia Argue MD  ?FARXIGA 5 MG TABS tablet TAKE 1 TABLET BY MOUTH DAILY BEFORE BREAKFAST. 07/08/21  Yes Armina Galloway, MInes Bloomer MD  ?glipiZIDE (GLUCOTROL) 10 MG tablet TAKE ONE TABLET BY MOUTH TWICE A DAY BEFORE A MEAL 07/23/21  Yes Joeseph Verville, MInes Bloomer MD  ?lisinopril-hydrochlorothiazide (ZESTORETIC) 10-12.5 MG tablet TAKE 1 TABLET BY MOUTH EVERY DAY 07/08/21  Yes Vanecia Limpert, MInes Bloomer MD  ?metFORMIN (GLUCOPHAGE) 1000 MG tablet TAKE ONE TABLET BY MOUTH TWICE A DAY WITH A MEAL 06/22/21  Yes Bronnie Vasseur, MInes Bloomer MD  ?Semaglutide, 1 MG/DOSE, (OZEMPIC, 1 MG/DOSE,) 4 MG/3ML SOPN Inject 1 mg into the skin once a week. 06/19/21   Yes SHorald Pollen MD  ? ? ?No Known Allergies ? ?Patient Active Problem List  ? Diagnosis Date Noted  ? Pelvic pain 06/19/2021  ? Varicose veins of left lower extremity with complications 063/[id number] ? Diabetes type 2, uncontrolled 11/09/2014  ? Morbid obesity ([facility] 08/04/2014  ? Dyslipidemia associated with type 2 diabetes mellitus ([facility] 08/04/2014  ? Hypertension associated with diabetes ([facility] [place] 08/04/2014  ? ? ?Past Medical History:  ?Diagnosis Date  ? Anemia   ? Diabetes mellitus without complication ([facility]   ? Hyperlipidemia   ? Hypertension   ? ? ?Past Surgical History:  ?Procedure Laterality Date  ? LAPAROSCOPIC UNILATERAL SALPINGO OOPHERECTOMY    ? ? ?Social History  ? ?Socioeconomic History  ? Marital status: Single  ?  Spouse name: Not on file  ? Number of children: Not on file  ? Years of education: Not on file  ? Highest education level: Not on file  ?Occupational History  ? Not on file  ?Tobacco Use  ? Smoking status: Never  ? Smokeless tobacco: Never  ?Substance and Sexual Activity  ? Alcohol use: No  ?  Alcohol/week: 0.0 standard drinks  ? Drug use: No  ? Sexual activity: Yes  ?  Birth control/protection: Post-menopausal  ?Other Topics Concern  ? Not on file  ?Social History Narrative  ? Not on file  ? ?Social Determinants of Health  ? ?Financial Resource Strain: Not on  file  ?Food Insecurity: Not on file  ?Transportation Needs: Not on file  ?Physical Activity: Not on file  ?Stress: Not on file  ?Social Connections: Not on file  ?Intimate Partner Violence: Not on file  ? ? ?Family History  ?Problem Relation Age of Onset  ? Hyperlipidemia Mother   ? Hypertension Mother   ? Hypertension Father   ? ? ? ?Review of Systems  ?Constitutional: Negative.  Negative for chills and fever.  ?HENT: Negative.  Negative for congestion and sore throat.   ?Respiratory: Negative.  Negative for cough and shortness of breath.   ?Cardiovascular: Negative.  Negative for chest pain and palpitations.  ?Gastrointestinal:   Negative for abdominal pain, diarrhea, nausea and vomiting.  ?Genitourinary: Negative.   ?Skin: Negative.  Negative for rash.  ?Neurological:  Negative for dizziness and headaches.  ?All other systems reviewed and are negative. ?Today's Vitals  ? 09/17/21 0809  ?BP: 110/64  ?Pulse: 85  ?Temp: 98.4 ?F (36.9 ?C)  ?TempSrc: Oral  ?SpO2: 97%  ?Weight: 223 lb 2 oz (101.2 kg)  ?Height: 5' 5"  (1.651 m)  ? ?Body mass index is 37.13 kg/m?. ?Wt Readings from Last 3 Encounters:  ?09/17/21 223 lb 2 oz (101.2 kg)  ?08/28/21 225 lb (102.1 kg)  ?06/19/21 226 lb (102.5 kg)  ? ? ? ?Physical Exam ?Vitals reviewed.  ?Constitutional:   ?   Appearance: Normal appearance.  ?HENT:  ?   Head: Normocephalic.  ?Eyes:  ?   Extraocular Movements: Extraocular movements intact.  ?   Pupils: Pupils are equal, round, and reactive to light.  ?Cardiovascular:  ?   Rate and Rhythm: Normal rate and regular rhythm.  ?   Pulses: Normal pulses.  ?   Heart sounds: Normal heart sounds.  ?Pulmonary:  ?   Effort: Pulmonary effort is normal.  ?   Breath sounds: Normal breath sounds.  ?Musculoskeletal:  ?   Cervical back: No tenderness.  ?Lymphadenopathy:  ?   Cervical: No cervical adenopathy.  ?Skin: ?   General: Skin is warm and dry.  ?   Capillary Refill: Capillary refill takes less than 2 seconds.  ?Neurological:  ?   General: No focal deficit present.  ?   Mental Status: She is alert and oriented to person, place, and time.  ?Psychiatric:     ?   Mood and Affect: Mood normal.     ?   Behavior: Behavior normal.  ? ? ?Results for orders placed or performed in visit on 09/17/21 (from the past 24 hour(s))  ?POCT glycosylated hemoglobin (Hb A1C)     Status: Abnormal  ? Collection Time: 09/17/21  8:24 AM  ?Result Value Ref Range  ? Hemoglobin A1C 6.0 (A) 4.0 - 5.6 %  ? HbA1c POC (<> result, manual entry)    ? HbA1c, POC (prediabetic range)    ? HbA1c, POC (controlled diabetic range)    ? ? ?ASSESSMENT & PLAN: ?A total of 49 minutes was spent with the patient  and counseling/coordination of care regarding preparing for this visit, review of most recent office visit notes, review of all medications and changes made, review of most recent blood work results including today's hemoglobin A1c, cardiovascular risks associated with diabetes and hypertension, education on nutrition, need for colon cancer screening with colonoscopy, benefits of exercise, prognosis, documentation and need for follow-up ? ?Problem List Items Addressed This Visit   ? ?  ? Cardiovascular and Mediastinum  ? Hypertension associated with diabetes ([place]) - Primary  ?  Well-controlled hypertension.  Continue Zestoretic 10-12.5 mg daily. ?Well-controlled diabetes with hemoglobin A1c of 6.0. ?Continue weekly Ozempic 1 mg, metformin 1000 mg twice a day and Farxiga 5 mg daily. ?Stop glipizide. ?Diet and nutrition discussed. ?Follow-up in 3 months. ? ?  ?  ? Relevant Orders  ? POCT glycosylated hemoglobin (Hb A1C) (Completed)  ? Comprehensive metabolic panel  ? Urine Microalbumin w/creat. ratio  ?  ? Endocrine  ? Dyslipidemia associated with type 2 diabetes mellitus ([place])  ?  Stable.  Lipid profile done today.  Continue atorvastatin 40 mg daily. ? ? ?  ?  ? Relevant Orders  ? Lipid panel  ? ?Other Visit Diagnoses   ? ? Colon cancer screening      ? Relevant Orders  ? Ambulatory referral to Gastroenterology  ? ?  ? ? ? ?Patient Instructions  ?Continue Ozempic, metformin, and [place]. ?Stop glipizide. ?Diabetes mellitus y nutrici?n, en adultos ?Diabetes Mellitus and Nutrition, Adult ?Si sufre de diabetes, o diabetes mellitus, es muy importante tener h?bitos alimenticios saludables debido a que sus niveles de az?car en la sangre (glucosa) se ven afectados en gran medida por lo que come y bebe. [place] alimentos saludables en las cantidades correctas, aproximadamente a la misma hora todos los d?as, lo ayudar? a: ?Controlar su glucemia. ?Disminuir el riesgo de sufrir una enfermedad card?aca. ?Mejorar la presi?n  arterial. ?Alcanzar o mantener un peso saludable. ??Qu? puede afectar mi plan de alimentaci?n? ?Todas las personas que sufren de diabetes son diferentes y cada una tiene necesidades diferentes en cuanto a un

## 2021-09-17 NOTE — Assessment & Plan Note (Signed)
Stable.  Lipid profile done today.  Continue atorvastatin 40 mg daily. ? ?

## 2021-09-17 NOTE — Assessment & Plan Note (Signed)
Well-controlled hypertension.  Continue Zestoretic 10-12.5 mg daily. ?Well-controlled diabetes with hemoglobin A1c of 6.0. ?Continue weekly Ozempic 1 mg, metformin 1000 mg twice a day and Farxiga 5 mg daily. ?Stop glipizide. ?Diet and nutrition discussed. ?Follow-up in 3 months. ?

## 2021-10-18 ENCOUNTER — Other Ambulatory Visit: Payer: Self-pay | Admitting: Emergency Medicine

## 2021-10-18 DIAGNOSIS — E1165 Type 2 diabetes mellitus with hyperglycemia: Secondary | ICD-10-CM

## 2021-10-23 ENCOUNTER — Telehealth: Payer: Self-pay | Admitting: Emergency Medicine

## 2021-10-23 NOTE — Telephone Encounter (Signed)
1.Medication Requested: Semaglutide, 1 MG/DOSE, (OZEMPIC, 1 MG/DOSE,) 4 MG/3ML SOPN 2. Pharmacy ([place], [place], [place]): [id number] [place]:  [phone] Fax:  [phone]    3. On Med List: yes  4. Last Visit with PCP:  5. Next visit date with PCP:   Agent: Please be advised that RX refills may take up to 3 business days. We ask that you follow-up with your pharmacy.

## 2021-10-24 NOTE — Telephone Encounter (Signed)
Called patient to inform her that her medication was sent to her pharmacy with 7 refills. Patient to call pharmacy to request a refill

## 2021-10-27 ENCOUNTER — Other Ambulatory Visit: Payer: Self-pay | Admitting: Emergency Medicine

## 2021-10-27 DIAGNOSIS — E1165 Type 2 diabetes mellitus with hyperglycemia: Secondary | ICD-10-CM

## 2021-10-28 ENCOUNTER — Encounter: Payer: Self-pay | Admitting: Emergency Medicine

## 2021-10-28 ENCOUNTER — Ambulatory Visit: Payer: BC Managed Care – PPO | Admitting: Emergency Medicine

## 2021-10-28 ENCOUNTER — Telehealth: Payer: Self-pay | Admitting: Emergency Medicine

## 2021-10-28 VITALS — BP 100/70 | HR 92 | Temp 98.6°F | Ht 65.0 in | Wt 219.5 lb

## 2021-10-28 DIAGNOSIS — I10 Essential (primary) hypertension: Secondary | ICD-10-CM

## 2021-10-28 DIAGNOSIS — I152 Hypertension secondary to endocrine disorders: Secondary | ICD-10-CM

## 2021-10-28 DIAGNOSIS — E1169 Type 2 diabetes mellitus with other specified complication: Secondary | ICD-10-CM

## 2021-10-28 DIAGNOSIS — R252 Cramp and spasm: Secondary | ICD-10-CM | POA: Diagnosis not present

## 2021-10-28 DIAGNOSIS — E785 Hyperlipidemia, unspecified: Secondary | ICD-10-CM

## 2021-10-28 DIAGNOSIS — E782 Mixed hyperlipidemia: Secondary | ICD-10-CM

## 2021-10-28 DIAGNOSIS — E1159 Type 2 diabetes mellitus with other circulatory complications: Secondary | ICD-10-CM | POA: Diagnosis not present

## 2021-10-28 DIAGNOSIS — E1165 Type 2 diabetes mellitus with hyperglycemia: Secondary | ICD-10-CM

## 2021-10-28 LAB — CBC WITH DIFFERENTIAL/PLATELET
Basophils Absolute: 0.1 10*3/uL (ref 0.0–0.1)
Basophils Relative: 0.6 % (ref 0.0–3.0)
Eosinophils Absolute: 0.2 10*3/uL (ref 0.0–0.7)
Eosinophils Relative: 2.2 % (ref 0.0–5.0)
HCT: 36.5 % (ref 36.0–46.0)
Hemoglobin: 12.3 g/dL (ref 12.0–15.0)
Lymphocytes Relative: 23.2 % (ref 12.0–46.0)
Lymphs Abs: 2 10*3/uL (ref 0.7–4.0)
MCHC: 33.8 g/dL (ref 30.0–36.0)
MCV: 86.8 fl (ref 78.0–100.0)
Monocytes Absolute: 0.9 10*3/uL (ref 0.1–1.0)
Monocytes Relative: 10.6 % (ref 3.0–12.0)
Neutro Abs: 5.4 10*3/uL (ref 1.4–7.7)
Neutrophils Relative %: 63.4 % (ref 43.0–77.0)
Platelets: 256 10*3/uL (ref 150.0–400.0)
RBC: 4.2 Mil/uL (ref 3.87–5.11)
RDW: 13.3 % (ref 11.5–15.5)
WBC: 8.6 10*3/uL (ref 4.0–10.5)

## 2021-10-28 LAB — COMPREHENSIVE METABOLIC PANEL
ALT: 24 U/L (ref 0–35)
AST: 22 U/L (ref 0–37)
Albumin: 4.6 g/dL (ref 3.5–5.2)
Alkaline Phosphatase: 84 U/L (ref 39–117)
BUN: 34 mg/dL — ABNORMAL HIGH (ref 6–23)
CO2: 23 mEq/L (ref 19–32)
Calcium: 9.5 mg/dL (ref 8.4–10.5)
Chloride: 101 mEq/L (ref 96–112)
Creatinine, Ser: 2.05 mg/dL — ABNORMAL HIGH (ref 0.40–1.20)
GFR: 27.66 mL/min — ABNORMAL LOW (ref >60.00)
Glucose, Bld: 244 mg/dL — ABNORMAL HIGH (ref 70–99)
Potassium: 4.1 mEq/L (ref 3.5–5.1)
Sodium: 135 mEq/L (ref 135–145)
Total Bilirubin: 0.4 mg/dL (ref 0.2–1.2)
Total Protein: 7.8 g/dL (ref 6.0–8.3)

## 2021-10-28 LAB — SEDIMENTATION RATE: Sed Rate: 45 mm/hr — ABNORMAL HIGH (ref 0–30)

## 2021-10-28 MED ORDER — OZEMPIC (1 MG/DOSE) 4 MG/3ML ~~LOC~~ SOPN: 1.0000 mg | PEN_INJECTOR | SUBCUTANEOUS | 7 refills | Status: DC

## 2021-10-28 NOTE — Patient Instructions (Signed)
Calambres en las piernas Leg Cramps Los calambres en las piernas se producen cuando uno o ms msculos se tensionan, y la persona no lo puede controlar (contraccin muscular involuntaria). Los calambres musculares son ms frecuentes en los msculos de la pantorrilla de la pierna. Pueden ocurrir al hacer ejercicio o al estar en reposo. Los calambres en las piernas son dolorosos y pueden durar de unos segundos a unos minutos. Es posible que aparezcan varias veces antes de detenerse finalmente. Por lo general, la causa de los calambres en las piernas no es un problema mdico grave. En muchos de los casos, se desconoce la causa. Algunas causas frecuentes son las siguientes: Esfuerzo fsico excesivo (sobrecarga) como durante una actividad fsica extenuante. Repetir el mismo movimiento una y otra vez. Permanecer en determinada posicin durante un perodo prolongado. Preparacin, tcnica o mtodo inadecuados al practicar un deporte o hacer una actividad. Deshidratacin. Lesiones. Efectos secundarios de determinados medicamentos. Niveles anormalmente bajos de minerales en la sangre (electrolitos), en especial, de potasio y calcio. Esto puede ser consecuencia de: Embarazo. Tomar medicamentos diurticos. Siga estas instrucciones en su casa: Comida y bebida Beber suficiente lquido como para mantener la orina de color amarillo plido. Mantenerse hidratado puede ayudar a evitar los calambres. Consuma una dieta saludable que incluya gran cantidad de nutrientes para ayudar al funcionamiento de los msculos. Una dieta saludable incluye frutas y verduras, protenas magras, cereales integrales y productos lcteos descremados o semidescremados. Control del dolor, la rigidez y la hinchazn     Intente masajear, estirar y relajar el msculo afectado. Hgalo durante varios minutos. Si se lo indican, aplique hielo en las zonas con molestias o dolores despus de un calambre. Para hacer esto: Ponga el hielo en una  bolsa plstica. Coloque una toalla entre la piel y la bolsa. Aplique el hielo durante 20 minutos, 2 o 3 veces por da. Retire el hielo si la piel se pone de color rojo brillante. Esto es muy importante. Si no puede sentir dolor, calor o fro, tiene un mayor riesgo de que se dae la zona. Si se lo indican, aplique calor en los msculos que estn tensos o tirantes. Haga esto antes de hacer ejercicio o con la frecuencia que le haya indicado el mdico. Use la fuente de calor que el mdico le recomiende, como una compresa de calor hmedo o una almohadilla trmica. Para hacer esto: Coloque una toalla entre la piel y la fuente de calor. Aplique calor durante 20 a 30 minutos. Retire la fuente de calor si la piel se pone de color rojo brillante. Esto es especialmente importante si no puede sentir dolor, calor o fro. Puede correr un riesgo mayor de sufrir quemaduras. Intente tomar duchas o baos con agua caliente para ayudar a relajar los msculos tirantes. Indicaciones generales Si tiene calambres en las piernas con frecuencia, evite el ejercicio intenso durante varios das. Use los medicamentos de venta libre y los recetados solamente como se lo haya indicado el mdico. Cumpla con todas las visitas de seguimiento. Esto es importante. Comunquese con un mdico si: Los calambres en las piernas se vuelven ms intensos o ms frecuentes, o no mejoran con el tiempo. Tiene el pie fro, adormecido o de color azul. Resumen Los calambres musculares pueden aparecer en cualquier msculo, pero el lugar ms frecuente es en los msculos de la pantorrilla. Los calambres en las piernas son dolorosos y pueden durar de unos segundos a unos minutos. Por lo general, la causa de los calambres en las piernas no es un   problema mdico grave. Generalmente, no se conoce la causa. Permanezca hidratado y tome los medicamentos de venta libre y recetados solamente como se lo haya indicado el mdico. Esta informacin no tiene como  fin reemplazar el consejo del mdico. Asegrese de hacerle al mdico cualquier pregunta que tenga. Document Revised: 12/08/2019 Document Reviewed: 12/08/2019 Elsevier Patient Education  2023 [employer].  

## 2021-10-28 NOTE — Progress Notes (Signed)
Jackie Wells 51 y.o.   Chief Complaint  Patient presents with   leg cramps     X 2 weeks , constant cramping calf muscle radiating up back of thigh    Referral    Referral to opthalmology for diabetic eye exam     HISTORY OF PRESENT ILLNESS: This is a 51 y.o. female with history of diabetes and hypertension complaining of 2-week history of bilateral leg cramps worse in the evening. Patient works 8-hour shifts 5 times a week.  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 Also requesting ophthalmology referral for diabetic eye exam. No other complaints or medical concerns today.  HPI   Prior to Admission medications   Medication Sig Start Date End Date Taking? Authorizing Provider  atorvastatin (LIPITOR) 40 MG tablet TAKE 1 TABLET BY MOUTH EVERY DAY 07/08/21   Horald Pollen, MD  blood glucose meter kit and supplies KIT Per insurance preference. Check blood glucose once a day. E11.9 03/14/19   Jacelyn Pi, Lilia Argue, MD  FARXIGA 5 MG TABS tablet TAKE 1 TABLET BY MOUTH DAILY BEFORE BREAKFAST. 07/08/21   Horald Pollen, MD  lisinopril-hydrochlorothiazide (ZESTORETIC) 10-12.5 MG tablet TAKE 1 TABLET BY MOUTH EVERY DAY 07/08/21   Horald Pollen, MD  metFORMIN (GLUCOPHAGE) 1000 MG tablet TAKE ONE TABLET BY MOUTH TWICE A DAY WITH A MEAL 06/22/21   Fontaine Kossman, Ines Bloomer, MD  Semaglutide, 1 MG/DOSE, (OZEMPIC, 1 MG/DOSE,) 4 MG/3ML SOPN Inject 1 mg into the skin once a week. 10/28/21   Horald Pollen, MD    No Known Allergies  Patient Active Problem List   Diagnosis Date Noted   Pelvic pain 06/19/2021   Varicose veins of left lower extremity with complications 21/19/4174   Diabetes type 2, uncontrolled 11/09/2014   Morbid obesity ([place]) 08/04/2014   Dyslipidemia associated with type 2 diabetes mellitus ([place]) 08/04/2014   Hypertension associated with diabetes ([place]) 08/04/2014    Past Medical History:  Diagnosis Date   Anemia    Diabetes mellitus without  complication ()    Hyperlipidemia    Hypertension     Past Surgical History:  Procedure Laterality Date   LAPAROSCOPIC UNILATERAL SALPINGO OOPHERECTOMY      Social History   Socioeconomic History   Marital status: Single    Spouse name: Not on file   Number of children: Not on file   Years of education: Not on file   Highest education level: Not on file  Occupational History   Not on file  Tobacco Use   Smoking status: Never   Smokeless tobacco: Never  Substance and Sexual Activity   Alcohol use: No    Alcohol/week: 0.0 standard drinks   Drug use: No   Sexual activity: Yes    Birth control/protection: Post-menopausal  Other Topics Concern   Not on file  Social History Narrative   Not on file   Social Determinants of Health   Financial Resource Strain: Not on file  Food Insecurity: Not on file  Transportation Needs: Not on file  Physical Activity: Not on file  Stress: Not on file  Social Connections: Not on file  Intimate Partner Violence: Not on file    Family History  Problem Relation Age of Onset   Hyperlipidemia Mother    Hypertension Mother    Hypertension Father      Review of Systems  Constitutional: Negative.  Negative for chills and fever.  HENT: Negative.  Negative for congestion.   Respiratory:  Negative.  Negative for cough and shortness of breath.   Cardiovascular: Negative.  Negative for chest pain and palpitations.  Gastrointestinal:  Negative for abdominal pain, diarrhea, nausea and vomiting.  Genitourinary: Negative.   Skin: Negative.  Negative for rash.  Neurological:  Negative for dizziness, sensory change, focal weakness and headaches.  All other systems reviewed and are negative.  Today's Vitals   10/28/21 1312  BP: 100/70  Pulse: 92  Temp: 98.6 F (37 C)  TempSrc: Oral  SpO2: 96%  Weight: 219 lb 8 oz (99.6 kg)  Height: 5' 5"  (1.651 m)   Body mass index is 36.53 kg/m.  Physical Exam Vitals reviewed.  Constitutional:       Appearance: Normal appearance.  HENT:     Head: Normocephalic.     Mouth/Throat:     Mouth: Mucous membranes are moist.     Pharynx: Oropharynx is clear.  Eyes:     Extraocular Movements: Extraocular movements intact.     Conjunctiva/sclera: Conjunctivae normal.     Pupils: Pupils are equal, round, and reactive to light.  Cardiovascular:     Rate and Rhythm: Normal rate and regular rhythm.     Pulses: Normal pulses.     Heart sounds: Normal heart sounds.  Pulmonary:     Effort: Pulmonary effort is normal.     Breath sounds: Normal breath sounds.  Abdominal:     General: There is no distension.     Palpations: Abdomen is soft.     Tenderness: There is no abdominal tenderness.  Musculoskeletal:     Cervical back: No tenderness.     Right lower leg: No edema.     Left lower leg: No edema.     Comments: Lower extremities: No erythema or ecchymosis.  No tenderness.  Neurovascularly intact.  Good distal peripheral pulses.  Good distal sensation.  Good capillary refill. Mild varicosity of veins.  No swelling.  Lymphadenopathy:     Cervical: No cervical adenopathy.  Skin:    General: Skin is warm and dry.     Capillary Refill: Capillary refill takes less than 2 seconds.  Neurological:     General: No focal deficit present.     Mental Status: She is alert and oriented to person, place, and time.  Psychiatric:        Mood and Affect: Mood normal.        Behavior: Behavior normal.    ASSESSMENT & PLAN: Problem List Items Addressed This Visit       Cardiovascular and Mediastinum   Hypertension associated with diabetes ([place])    Well-controlled hypertension. BP Readings from Last 3 Encounters:  10/28/21 100/70  09/17/21 110/64  08/28/21 121/73  Continue Zestoretic 10-12.5 mg daily. Lab Results  Component Value Date   HGBA1C 6.0 (A) 09/17/2021  Well-controlled diabetes.  Continue Ozempic 1 mg weekly, Farxiga 5 mg and metformin 1000 mg twice a day.        Relevant  Medications   Semaglutide, 1 MG/DOSE, (OZEMPIC, 1 MG/DOSE,) 4 MG/3ML SOPN   Other Relevant Orders   Ambulatory referral to Ophthalmology     Endocrine   Dyslipidemia associated with type 2 diabetes mellitus ([place])    Stable.  Diet and nutrition discussed. Continue atorvastatin 40 mg daily.        Relevant Medications   Semaglutide, 1 MG/DOSE, (OZEMPIC, 1 MG/DOSE,) 4 MG/3ML SOPN   Diabetes type 2, uncontrolled   Relevant Medications   Semaglutide, 1 MG/DOSE, (OZEMPIC, 1 MG/DOSE,) 4  MG/3ML SOPN     Other   Bilateral leg cramps - Primary    Differential diagnosis discussed.  Most likely related to activities of daily work.  No circulatory problems detected on physical exam although she has some varicose veins of lower extremities.  Advised to stretch, use heat pad, stay well-hydrated, and elevate legs when possible.       Relevant Orders   Comprehensive metabolic panel (Completed)   CBC with Differential/Platelet (Completed)   Sedimentation rate (Completed)   CK   Other Visit Diagnoses     Mixed hyperlipidemia       Essential hypertension          Patient Instructions  Calambres en las piernas Leg Cramps Los calambres en las piernas se producen cuando uno o ms msculos se tensionan, y Engineer, materials persona no lo puede controlar (contraccin muscular involuntaria). Los calambres musculares son ms frecuentes en los msculos de la pantorrilla de la pierna. Pueden ocurrir al hacer ejercicio o al estar en reposo. Los [employer] piernas son dolorosos y pueden durar de unos segundos a unos minutos. Es posible que aparezcan varias veces antes de detenerse finalmente. Por lo general, la causa de los [employer] piernas no es un problema mdico grave. En muchos de [employer], se desconoce la causa. Algunas causas frecuentes son las siguientes: Esfuerzo fsico excesivo Clinical cytogeneticist) como durante una actividad fsica extenuante. Repetir el mismo movimiento una y [place]. Permanecer en  determinada posicin durante un perodo prolongado. Preparacin, tcnica o mtodo inadecuados al practicar un deporte o hacer una [place]. Deshidratacin. Lesiones. Efectos secundarios de determinados medicamentos. Niveles anormalmente bajos de minerales en la sangre (electrolitos), en especial, de potasio y calcio. Esto puede ser consecuencia de: Embarazo. Tomar medicamentos diurticos. Siga estas instrucciones en su casa: Comida y bebida Beber suficiente lquido como para Theatre manager la orina de color amarillo plido. Mantenerse hidratado puede ayudar a Production assistant, radio. Consuma una dieta saludable que incluya gran cantidad de nutrientes para ayudar al funcionamiento de los msculos. Una dieta saludable incluye frutas y verduras, protenas magras, cereales integrales y productos lcteos descremados o semidescremados. Control del dolor, la rigidez y la hinchazn     Intente Community education officer, Training and development officer y Surveyor, mining msculo afectado. Hgalo durante varios minutos. Si se lo indican, aplique hielo en las zonas con molestias o dolores despus de un calambre. Para hacer esto: Ponga el hielo en una bolsa plstica. Coloque una toalla entre la piel y Therapist, nutritional. Aplique el hielo durante 20 minutos, 2 o 3 veces por da. Retire el hielo si la piel se pone de color rojo brillante. Esto es [employer]. Si no puede sentir dolor, calor o fro, tiene un mayor riesgo de que se dae la zona. Si se lo indican, aplique calor en los msculos que estn tensos o tirantes. Haga esto antes de hacer ejercicio o con la frecuencia que le haya indicado el mdico. Use la fuente de calor que el mdico le recomiende, como una compresa de calor hmedo o una almohadilla trmica. Para hacer esto: Coloque una toalla entre la piel y la fuente de Freight forwarder. Aplique calor durante 20 a 30 minutos. Retire la fuente de calor si la piel se pone de color rojo brillante. Esto es especialmente importante si no puede sentir dolor, calor o fro.  Puede correr un riesgo mayor de sufrir quemaduras. Intente tomar duchas o baos con agua caliente para ayudar a relajar los msculos tirantes. Indicaciones generales Si tiene calambres en las  piernas con frecuencia, evite el ejercicio intenso [employer]. Use los medicamentos de venta libre y los recetados solamente como se lo haya indicado el mdico. Cumpla con todas las visitas de seguimiento. Esto es importante. Comunquese con un mdico si: Los calambres en las piernas se vuelven ms intensos o ms frecuentes, o no mejoran con el tiempo. Tiene el pie fro, adormecido o de [employer]. Resumen Los calambres musculares pueden aparecer en cualquier msculo, pero el lugar ms frecuente es en los msculos de la pantorrilla. Los [employer] piernas son dolorosos y pueden durar de unos segundos a unos minutos. Por lo general, la causa de los [employer] piernas no es un problema mdico grave. Generalmente, no se conoce la causa. Permanezca hidratado y tome los medicamentos de venta libre y recetados solamente como se lo haya indicado el mdico. Esta informacin no tiene Marine scientist el consejo del mdico. Asegrese de hacerle al mdico cualquier pregunta que tenga. Document Revised: 12/08/2019 Document Reviewed: 12/08/2019 Elsevier Patient Education  [place]ry[place], MD [facility]

## 2021-10-28 NOTE — Assessment & Plan Note (Signed)
Well-controlled hypertension. BP Readings from Last 3 Encounters:  10/28/21 100/70  09/17/21 110/64  08/28/21 121/73  Continue Zestoretic 10-12.5 mg daily. Lab Results  Component Value Date   HGBA1C 6.0 (A) 09/17/2021  Well-controlled diabetes.  Continue Ozempic 1 mg weekly, Farxiga 5 mg and metformin 1000 mg twice a day.

## 2021-10-28 NOTE — Telephone Encounter (Signed)
Blood work shows decreased GFR, increased BUN and creatinine, and hyperglycemia.  Results discussed with patient.  Advised to stop metformin and [place].  Continue weekly Ozempic, and daily Zestoretic and atorvastatin.  Will reassess in 1 week.

## 2021-10-28 NOTE — Assessment & Plan Note (Signed)
Stable.  Diet and nutrition discussed. Continue atorvastatin 40 mg daily. 

## 2021-10-28 NOTE — Assessment & Plan Note (Signed)
Differential diagnosis discussed.  Most likely related to activities of daily work.  No circulatory problems detected on physical exam although she has some varicose veins of lower extremities.  Advised to stretch, use heat pad, stay well-hydrated, and elevate legs when possible.

## 2021-10-29 ENCOUNTER — Other Ambulatory Visit: Payer: Self-pay | Admitting: Emergency Medicine

## 2021-10-29 DIAGNOSIS — N179 Acute kidney failure, unspecified: Secondary | ICD-10-CM

## 2021-10-29 LAB — CK: Total CK: 71 U/L (ref 32–182)

## 2021-11-03 ENCOUNTER — Telehealth: Payer: Self-pay | Admitting: Emergency Medicine

## 2021-11-03 ENCOUNTER — Other Ambulatory Visit (INDEPENDENT_AMBULATORY_CARE_PROVIDER_SITE_OTHER): Payer: BC Managed Care – PPO

## 2021-11-03 DIAGNOSIS — N179 Acute kidney failure, unspecified: Secondary | ICD-10-CM

## 2021-11-03 LAB — BASIC METABOLIC PANEL
BUN: 23 mg/dL (ref 6–23)
CO2: 26 mEq/L (ref 19–32)
Calcium: 9.3 mg/dL (ref 8.4–10.5)
Chloride: 102 mEq/L (ref 96–112)
Creatinine, Ser: 1.18 mg/dL (ref 0.40–1.20)
GFR: 53.66 mL/min — ABNORMAL LOW (ref >60.00)
Glucose, Bld: 256 mg/dL — ABNORMAL HIGH (ref 70–99)
Potassium: 4.1 mEq/L (ref 3.5–5.1)
Sodium: 136 mEq/L (ref 135–145)

## 2021-11-03 NOTE — Telephone Encounter (Signed)
Today's blood results discussed with patient.  Improved kidney function test.  Medication management discussed.  Close follow-up in 4 weeks.

## 2021-12-03 ENCOUNTER — Telehealth: Payer: Self-pay | Admitting: Emergency Medicine

## 2021-12-03 ENCOUNTER — Encounter: Payer: Self-pay | Admitting: Emergency Medicine

## 2021-12-03 ENCOUNTER — Ambulatory Visit: Payer: BC Managed Care – PPO | Admitting: Emergency Medicine

## 2021-12-03 VITALS — BP 122/82 | HR 96 | Temp 98.6°F | Ht 65.0 in | Wt 220.5 lb

## 2021-12-03 DIAGNOSIS — E785 Hyperlipidemia, unspecified: Secondary | ICD-10-CM

## 2021-12-03 DIAGNOSIS — R252 Cramp and spasm: Secondary | ICD-10-CM

## 2021-12-03 DIAGNOSIS — E1159 Type 2 diabetes mellitus with other circulatory complications: Secondary | ICD-10-CM

## 2021-12-03 DIAGNOSIS — E1169 Type 2 diabetes mellitus with other specified complication: Secondary | ICD-10-CM

## 2021-12-03 DIAGNOSIS — I152 Hypertension secondary to endocrine disorders: Secondary | ICD-10-CM

## 2021-12-03 DIAGNOSIS — I10 Essential (primary) hypertension: Secondary | ICD-10-CM

## 2021-12-03 LAB — COMPREHENSIVE METABOLIC PANEL
ALT: 15 U/L (ref 0–35)
AST: 11 U/L (ref 0–37)
Albumin: 4.3 g/dL (ref 3.5–5.2)
Alkaline Phosphatase: 91 U/L (ref 39–117)
BUN: 19 mg/dL (ref 6–23)
CO2: 26 mEq/L (ref 19–32)
Calcium: 9.5 mg/dL (ref 8.4–10.5)
Chloride: 101 mEq/L (ref 96–112)
Creatinine, Ser: 0.91 mg/dL (ref 0.40–1.20)
GFR: 73.25 mL/min (ref >60.00)
Glucose, Bld: 252 mg/dL — ABNORMAL HIGH (ref 70–99)
Potassium: 4.2 mEq/L (ref 3.5–5.1)
Sodium: 136 mEq/L (ref 135–145)
Total Bilirubin: 0.4 mg/dL (ref 0.2–1.2)
Total Protein: 7.3 g/dL (ref 6.0–8.3)

## 2021-12-03 LAB — LIPID PANEL
Cholesterol: 130 mg/dL (ref 0–200)
HDL: 39.7 mg/dL (ref >39.00)
LDL Cholesterol: 56 mg/dL (ref 0–99)
NonHDL: 89.8
Total CHOL/HDL Ratio: 3
Triglycerides: 167 mg/dL — ABNORMAL HIGH (ref 0.0–149.0)
VLDL: 33.4 mg/dL (ref 0.0–40.0)

## 2021-12-03 LAB — HEMOGLOBIN A1C: Hgb A1c MFr Bld: 8.9 % — ABNORMAL HIGH (ref 4.6–6.5)

## 2021-12-03 NOTE — Telephone Encounter (Signed)
Spoke to patient about today's blood results. Continue Ozempic 1 mg weekly and restart Farxiga 5 mg daily.

## 2021-12-03 NOTE — Progress Notes (Signed)
Jackie Wells 51 y.o.   Chief Complaint  Patient presents with   Follow-up    4 week f/u appt    leg cramps   Medication Management    Pt has questions about Farxiga and metformin.     HISTORY OF PRESENT ILLNESS: This is a 51 y.o. female with history of diabetes here for follow-up Patient was taking Ozempic, [place] and metformin but developed acute renal failure. Farxiga and metformin were stopped.  Kidney function tests improved. Presently taking Ozempic 1 mg weekly, atorvastatin 40 mg daily, and Zestoretic 10-12.5 mg daily. Doing well.  Gets occasional cramping to legs but much less than before. No other complaints or medical concerns today.  HPI   Prior to Admission medications   Medication Sig Start Date End Date Taking? Authorizing Provider  atorvastatin (LIPITOR) 40 MG tablet TAKE 1 TABLET BY MOUTH EVERY DAY 07/08/21  Yes Jinger Middlesworth, Ines Bloomer, MD  blood glucose meter kit and supplies KIT Per insurance preference. Check blood glucose once a day. E11.9 03/14/19  Yes Jacelyn Pi, Irma M, MD  lisinopril-hydrochlorothiazide (ZESTORETIC) 10-12.5 MG tablet TAKE 1 TABLET BY MOUTH EVERY DAY 07/08/21  Yes Jiovani Mccammon, [place], MD  Semaglutide, 1 MG/DOSE, (OZEMPIC, 1 MG/DOSE,) 4 MG/3ML SOPN Inject 1 mg into the skin once a week. 10/28/21  Yes Avelynn Sellin, Ines Bloomer, MD  FARXIGA 5 MG TABS tablet TAKE 1 TABLET BY MOUTH DAILY BEFORE BREAKFAST. Patient not taking: Reported on 12/03/2021 07/08/21   Horald Pollen, MD  metFORMIN (GLUCOPHAGE) 1000 MG tablet TAKE ONE TABLET BY MOUTH TWICE A DAY WITH A MEAL Patient not taking: Reported on 12/03/2021 06/22/21   Horald Pollen, MD    No Known Allergies  Patient Active Problem List   Diagnosis Date Noted   Bilateral leg cramps 10/28/2021   Pelvic pain 06/19/2021   Varicose veins of left lower extremity with complications 13/12/6576   Diabetes type 2, uncontrolled 11/09/2014   Morbid obesity ([place]) 08/04/2014   Dyslipidemia  associated with type 2 diabetes mellitus ([place]) 08/04/2014   Hypertension associated with diabetes ([place]) 08/04/2014    Past Medical History:  Diagnosis Date   Anemia    Diabetes mellitus without complication ([place])    Hyperlipidemia    Hypertension     Past Surgical History:  Procedure Laterality Date   LAPAROSCOPIC UNILATERAL SALPINGO OOPHERECTOMY      Social History   Socioeconomic History   Marital status: Single    Spouse name: Not on file   Number of children: Not on file   Years of education: Not on file   Highest education level: Not on file  Occupational History   Not on file  Tobacco Use   Smoking status: Never   Smokeless tobacco: Never  Substance and Sexual Activity   Alcohol use: No    Alcohol/week: 0.0 standard drinks of alcohol   Drug use: No   Sexual activity: Yes    Birth control/protection: Post-menopausal  Other Topics Concern   Not on file  Social History Narrative   Not on file   Social Determinants of Health   Financial Resource Strain: Not on file  Food Insecurity: Not on file  Transportation Needs: Not on file  Physical Activity: Not on file  Stress: Not on file  Social Connections: Not on file  Intimate Partner Violence: Not on file    Family History  Problem Relation Age of Onset   Hyperlipidemia Mother    Hypertension Mother    Hypertension  Father      Review of Systems  Constitutional: Negative.  Negative for chills and fever.  HENT: Negative.  Negative for congestion and sore throat.   Respiratory: Negative.  Negative for cough and shortness of breath.   Cardiovascular: Negative.  Negative for chest pain and palpitations.  Gastrointestinal:  Negative for abdominal pain, diarrhea, nausea and vomiting.  Genitourinary: Negative.  Negative for dysuria and hematuria.  Musculoskeletal:        Occasional lower extremity cramping  Skin: Negative.  Negative for rash.  Neurological:  Negative for dizziness and headaches.  All  other systems reviewed and are negative.  Today's Vitals   12/03/21 1004  BP: 122/82  Pulse: 96  Temp: 98.6 F (37 C)  TempSrc: Oral  SpO2: 95%  Weight: 220 lb 8 oz (100 kg)  Height: 5' 5"  (1.651 m)   Body mass index is 36.69 kg/m. Wt Readings from Last 3 Encounters:  12/03/21 220 lb 8 oz (100 kg)  10/28/21 219 lb 8 oz (99.6 kg)  09/17/21 223 lb 2 oz (101.2 kg)     Physical Exam Vitals reviewed.  Constitutional:      Appearance: Normal appearance.  HENT:     Head: Normocephalic.  Eyes:     Extraocular Movements: Extraocular movements intact.     Pupils: Pupils are equal, round, and reactive to light.  Cardiovascular:     Rate and Rhythm: Normal rate and regular rhythm.     Pulses: Normal pulses.     Heart sounds: Normal heart sounds.  Pulmonary:     Effort: Pulmonary effort is normal.     Breath sounds: Normal breath sounds.  Musculoskeletal:     Right lower leg: No edema.     Left lower leg: No edema.  Skin:    General: Skin is warm and dry.     Capillary Refill: Capillary refill takes less than 2 seconds.  Neurological:     General: No focal deficit present.     Mental Status: She is alert and oriented to person, place, and time.  Psychiatric:        Mood and Affect: Mood normal.        Behavior: Behavior normal.      ASSESSMENT & PLAN: Problem List Items Addressed This Visit       Cardiovascular and Mediastinum   Hypertension associated with diabetes ([place]) - Primary    Well-controlled hypertension. Continue Zestoretic 10-12.5 mg daily. BP Readings from Last 3 Encounters:  12/03/21 122/82  10/28/21 100/70  09/17/21 110/64  Well-controlled diabetes.  Hemoglobin A1c done today. Continue weekly Ozempic 1 mg. Not taking Wilder Glade or metformin due to kidney failure problems in the recent past. Advised to stay well-hydrated and avoid NSAIDs. Cardiovascular risks associated with diabetes and hypertension discussed. Diet and nutrition  discussed. Follow-up in 3 months.       Relevant Orders   Comprehensive metabolic panel   Hemoglobin A1c     Endocrine   Dyslipidemia associated with type 2 diabetes mellitus (HCC)    Stable.  Advised to decrease atorvastatin frequency due to occasional leg muscle cramps.  Advised to take atorvastatin 40 mg every other day. Diet and nutrition discussed.       Relevant Orders   Lipid panel     Other   Morbid obesity (HCC)   Bilateral leg cramps    Decreased frequency.  We will decrease frequency of atorvastatin to 40 mg every other day.  Patient Instructions  Diabetes mellitus y nutricin, en adultos Diabetes Mellitus and Nutrition, Adult Si sufre de diabetes, o diabetes mellitus, es muy importante tener hbitos alimenticios saludables debido a que sus niveles de Designer, television/film set sangre (glucosa) se ven afectados en gran medida por lo que come y bebe. [place] alimentos saludables en las cantidades correctas, aproximadamente a la misma hora todos los [place], [place] ayudar a: Chief Technology Officer su glucemia. Disminuir el riesgo de sufrir una enfermedad cardaca. Mejorar la presin arterial. Science writer o mantener un peso saludable. Qu puede afectar mi plan de alimentacin? Todas las personas que sufren de diabetes son diferentes y cada una tiene necesidades diferentes en cuanto a un plan de alimentacin. El mdico puede recomendarle que trabaje con un nutricionista para elaborar el mejor plan para usted. Su plan de alimentacin puede variar segn factores como: Las caloras que necesita. Los medicamentos que toma. Su peso. Sus niveles de glucemia, presin arterial y colesterol. Su nivel de [place]. Otras afecciones que tenga, como enfermedades cardacas o renales. Cmo me afectan los carbohidratos? Los carbohidratos, o hidratos de carbono, afectan su nivel de glucemia ms que cualquier otro tipo de alimento. La ingesta de carbohidratos aumenta la cantidad de [employer]. Es  importante conocer la cantidad de carbohidratos que se pueden ingerir en cada comida sin correr Engineer, manufacturing. Esto es Psychologist, forensic. Su nutricionista puede ayudarlo a calcular la cantidad de carbohidratos que debe ingerir en cada comida y en cada refrigerio. Cmo me afecta el alcohol? El alcohol puede provocar una disminucin de la glucemia (hipoglucemia), especialmente si [place] insulina o toma determinados medicamentos por va oral para la diabetes. La hipoglucemia es una afeccin potencialmente mortal. Los sntomas de la hipoglucemia, como somnolencia, mareos y confusin, son similares a los sntomas de haber consumido demasiado alcohol. No beba alcohol si: Su mdico le indica no hacerlo. Est embarazada, puede estar embarazada o est tratando de [place]. Si bebe alcohol: Limite la cantidad que bebe a lo siguiente: De 0 a 1 medida por da para las mujeres. De 0 a 2 medidas por da para los hombres. Sepa cunta cantidad de alcohol hay en las bebidas que toma. En los [place], una medida equivale a una botella de cerveza de 12 oz (355 ml), un vaso de vino de 5 oz (148 ml) o un vaso de una bebida alcohlica de alta graduacin de 1 oz (44 ml). Mantngase hidratado bebiendo agua, refrescos dietticos o t helado sin azcar. Tenga en cuenta que los refrescos comunes, los jugos y otras bebidas para mezclar pueden contener Freight forwarder y se deben contar como carbohidratos. Consejos para seguir Company secretary las etiquetas de los alimentos Comience por leer el tamao de la porcin en la etiqueta de Informacin nutricional de los alimentos envasados y las bebidas. La cantidad de caloras, carbohidratos, grasas y otros nutrientes detallados en la etiqueta se basan en una porcin del alimento. Muchos alimentos contienen ms de una porcin por envase. Verifique la cantidad total de gramos (g) de carbohidratos totales en una porcin. Verifique la cantidad de gramos de grasas  saturadas y grasas trans en una porcin. Escoja alimentos que no contengan estas grasas o que su contenido de estas sea [place]. Verifique la cantidad de miligramos (mg) de sal (sodio) en una porcin. [place] [employer] de las personas deben limitar la ingesta de sodio total a menos de 2300 mg Honeywell. Siempre consulte la informacin nutricional de los alimentos etiquetados como "con bajo contenido de  grasa" o "sin grasa". Estos alimentos pueden tener un mayor contenido de Location manager agregada o carbohidratos refinados, y deben evitarse. Hable con su nutricionista para identificar sus objetivos diarios en cuanto a los nutrientes mencionados en la etiqueta. Al ir de compras Evite comprar alimentos procesados, enlatados o precocidos. Estos alimentos tienden a Special educational needs teacher mayor cantidad de [place], sodio y azcar agregada. Compre en la zona exterior de la tienda de comestibles. Esta es la zona donde se encuentran con mayor frecuencia las frutas y las verduras frescas, los cereales a granel, las carnes frescas y los productos lcteos frescos. Al cocinar Use mtodos de coccin a baja temperatura, como hornear, en lugar de mtodos de coccin a alta temperatura, como frer en abundante aceite. Cocine con aceites saludables, como el aceite de [place], canola o [place]. Evite cocinar con manteca, crema o carnes con alto contenido de grasa. Planificacin de las comidas Coma las comidas y los refrigerios regularmente, preferentemente a la misma hora todos [place]. Evite pasar largos perodos de tiempo sin comer. Consuma alimentos ricos en fibra, como frutas frescas, verduras, frijoles y cereales integrales. Consuma entre 4 y 6 onzas (entre 112 y 168 g) de protenas magras por da, como carnes [place], pollo, pescado, huevos o tofu. Una onza (oz) (28 g) de protena magra equivale a: 1 onza (28 g) de carne, pollo o pescado. 1 huevo.  taza (62 g) de tofu. Coma algunos alimentos por da que contengan grasas saludables, como  aguacates, frutos secos, semillas y pescado. Qu alimentos debo comer? Lambert Mody Bayas. Manzanas. Naranjas. Duraznos. Damascos. Ciruelas. Uvas. Mangos. Papayas. Granadas. Kiwi. Cerezas. Verduras Verduras de Boeing, que incluyen [place], [place], col rizada, acelga, hojas de berza, hojas de mostaza y repollo. Remolachas. Coliflor. Brcoli. Zanahorias. Judas verdes. Tomates. Pimientos. Cebollas. Pepinos. Coles de Bruselas. Granos Granos integrales, como panes, galletas, tortillas, cereales y pastas de salvado o integrales. Avena sin azcar. Quinua. Arroz integral o salvaje. Carnes y otras protenas Frutos de mar. Carne de ave sin piel. Cortes magros de ave y carne de res. Tofu. Frutos secos. Semillas. Lcteos Productos lcteos sin grasa o con bajo contenido de [place], [place], yogur y [place]. Es posible que los productos detallados arriba no constituyan una lista completa de los alimentos y las bebidas que puede tomar. Consulte a un nutricionista para obtener ms informacin. Qu alimentos debo evitar? Lambert Mody Frutas enlatadas al almbar. Verduras Verduras enlatadas. Verduras congeladas con mantequilla o salsa de crema. Granos Productos elaborados con [place] y [place], como panes, pastas, bocadillos y cereales. Evite todos los alimentos procesados. Carnes y otras protenas Cortes de carne con alto contenido de Lobbyist. Carne de ave con piel. Carnes empanizadas o fritas. Carne procesada. Evite las grasas saturadas. Lcteos Yogur, [place] enteros. Bebidas Bebidas azucaradas, como gaseosas o t helado. Es posible que los productos que se enumeran ms [place] no constituyan una lista completa de los alimentos y las bebidas que Nurse, adult. Consulte a un nutricionista para obtener ms informacin. Preguntas para hacerle al mdico Debo consultar con un especialista certificado en atencin y educacin sobre la diabetes? Es necesario que me rena con un nutricionista? A  qu nmero puedo llamar si tengo preguntas? Cules son los mejores momentos para controlar la glucemia? Dnde encontrar ms informacin: American Diabetes Association (Asociacin Estadounidense de la Diabetes): diabetes.org Academy of Nutrition and Dietetics (Academia de Nutricin y Information systems manager): eatright.Unisys Corporation of Diabetes and Digestive and Kidney Diseases Countrywide Financial de la Diabetes y [place] y [place]):  AmenCredit.is Association of Diabetes Care & Education Specialists (Asociacin de Especialistas en Atencin y Educacin sobre la Diabetes): diabeteseducator.org Resumen Es importante tener hbitos alimenticios saludables debido a que sus niveles de Designer, television/film set sangre (glucosa) se ven afectados en gran medida por lo que come y bebe. Es importante consumir alcohol con prudencia. Un plan de comidas saludable lo ayudar a controlar la glucosa en sangre y a reducir el riesgo de enfermedades cardacas. El mdico puede recomendarle que trabaje con un nutricionista para elaborar el mejor plan para usted. Esta informacin no tiene Marine scientist el consejo del mdico. Asegrese de hacerle al mdico cualquier pregunta que tenga. Document Revised: 01/24/2020 Document Reviewed: 01/24/2020 Elsevier Patient Education  [place], MD [facility]

## 2021-12-03 NOTE — Assessment & Plan Note (Signed)
Decreased frequency.  We will decrease frequency of atorvastatin to 40 mg every other day.

## 2021-12-03 NOTE — Patient Instructions (Signed)
Diabetes mellitus y nutricin, en adultos Diabetes Mellitus and Nutrition, Adult Si sufre de diabetes, o diabetes mellitus, es muy importante tener hbitos alimenticios saludables debido a que sus niveles de azcar en la sangre (glucosa) se ven afectados en gran medida por lo que come y bebe. [place] alimentos saludables en las cantidades correctas, aproximadamente a la misma hora todos los das, lo ayudar a: Controlar su glucemia. Disminuir el riesgo de sufrir una enfermedad cardaca. Mejorar la presin arterial. Alcanzar o mantener un peso saludable. Qu puede afectar mi plan de alimentacin? Todas las personas que sufren de diabetes son diferentes y cada una tiene necesidades diferentes en cuanto a un plan de alimentacin. El mdico puede recomendarle que trabaje con un nutricionista para elaborar el mejor plan para usted. Su plan de alimentacin puede variar segn factores como: Las caloras que necesita. Los medicamentos que toma. Su peso. Sus niveles de glucemia, presin arterial y colesterol. Su nivel de actividad. Otras afecciones que tenga, como enfermedades cardacas o renales. Cmo me afectan los carbohidratos? Los carbohidratos, o hidratos de carbono, afectan su nivel de glucemia ms que cualquier otro tipo de alimento. La ingesta de carbohidratos aumenta la cantidad de glucosa en la sangre. Es importante conocer la cantidad de carbohidratos que se pueden ingerir en cada comida sin correr ningn riesgo. Esto es diferente en cada persona. Su nutricionista puede ayudarlo a calcular la cantidad de carbohidratos que debe ingerir en cada comida y en cada refrigerio. Cmo me afecta el alcohol? El alcohol puede provocar una disminucin de la glucemia (hipoglucemia), especialmente si usa insulina o toma determinados medicamentos por va oral para la diabetes. La hipoglucemia es una afeccin potencialmente mortal. Los sntomas de la hipoglucemia, como somnolencia, mareos y confusin, son  similares a los sntomas de haber consumido demasiado alcohol. No beba alcohol si: Su mdico le indica no hacerlo. Est embarazada, puede estar embarazada o est tratando de quedar embarazada. Si bebe alcohol: Limite la cantidad que bebe a lo siguiente: De 0 a 1 medida por da para las mujeres. De 0 a 2 medidas por da para los hombres. Sepa cunta cantidad de alcohol hay en las bebidas que toma. En los [place], una medida equivale a una botella de cerveza de 12 oz (355 ml), un vaso de vino de 5 oz (148 ml) o un vaso de una bebida alcohlica de alta graduacin de 1 oz (44 ml). Mantngase hidratado bebiendo agua, refrescos dietticos o t helado sin azcar. Tenga en cuenta que los refrescos comunes, los jugos y otras bebidas para mezclar pueden contener mucha azcar y se deben contar como carbohidratos. Consejos para seguir este plan  Leer las etiquetas de los alimentos Comience por leer el tamao de la porcin en la etiqueta de Informacin nutricional de los alimentos envasados y las bebidas. La cantidad de caloras, carbohidratos, grasas y otros nutrientes detallados en la etiqueta se basan en una porcin del alimento. Muchos alimentos contienen ms de una porcin por envase. Verifique la cantidad total de gramos (g) de carbohidratos totales en una porcin. Verifique la cantidad de gramos de grasas saturadas y grasas trans en una porcin. Escoja alimentos que no contengan estas grasas o que su contenido de estas sea bajo. Verifique la cantidad de miligramos (mg) de sal (sodio) en una porcin. La mayora de las personas deben limitar la ingesta de sodio total a menos de 2300 mg por da. Siempre consulte la informacin nutricional de los alimentos etiquetados como "con bajo contenido de grasa" o "sin grasa".   Estos alimentos pueden tener un mayor contenido de azcar agregada o carbohidratos refinados, y deben evitarse. Hable con su nutricionista para identificar sus objetivos diarios en  cuanto a los nutrientes mencionados en la etiqueta. Al ir de compras Evite comprar alimentos procesados, enlatados o precocidos. Estos alimentos tienden a tener una mayor cantidad de grasa, sodio y azcar agregada. Compre en la zona exterior de la tienda de comestibles. Esta es la zona donde se encuentran con mayor frecuencia las frutas y las verduras frescas, los cereales a granel, las carnes frescas y los productos lcteos frescos. Al cocinar Use mtodos de coccin a baja temperatura, como hornear, en lugar de mtodos de coccin a alta temperatura, como frer en abundante aceite. Cocine con aceites saludables, como el aceite de ol[place], can[place] o girasol. Evite cocinar con manteca, crema o carnes con alto contenido de grasa. Planificacin de las comidas Coma las comidas y los refrigerios regularmente, preferentemente a la misma hora todos los das. Evite pasar largos perodos de tiempo sin comer. Consuma alimentos ricos en fibra, como frutas frescas, verduras, frijoles y cereales integrales. Consuma entre 4 y 6 onzas (entre 112 y 168 g) de protenas magras por da, como carnes magras, pollo, pescado, huevos o tofu. Una onza (oz) (28 g) de protena magra equivale a: 1 onza (28 g) de carne, pollo o pescado. 1 huevo.  taza (62 g) de tofu. Coma algunos alimentos por da que contengan grasas saludables, como aguacates, frutos secos, semillas y pescado. Qu alimentos debo comer? Frutas Bayas. Manzanas. Naranjas. Duraznos. Damascos. Ciruelas. Uvas. Mangos. Papayas. Granadas. Kiwi. Cerezas. Verduras Verduras de hoja verde, que incluyen lechuga, espinaca, col rizada, acelga, hojas de berza, hojas de mostaza y repollo. Remolachas. Coliflor. Brcoli. Zanahorias. Judas verdes. Tomates. Pimientos. Cebollas. Pepinos. Coles de Bruselas. Granos Granos integrales, como panes, galletas, tortillas, cereales y pastas de salvado o integrales. Avena sin azcar. Quinua. Arroz integral o salvaje. Carnes y otras  protenas Frutos de mar. Carne de ave sin piel. Cortes magros de ave y carne de res. Tofu. Frutos secos. Semillas. Lcteos Productos lcteos sin grasa o con bajo contenido de grasa, como leche, yogur y queso. Es posible que los productos detallados arriba no constituyan una lista completa de los alimentos y las bebidas que puede tomar. Consulte a un nutricionista para obtener ms informacin. Qu alimentos debo evitar? Frutas Frutas enlatadas al almbar. Verduras Verduras enlatadas. Verduras congeladas con mantequilla o salsa de crema. Granos Productos elaborados con harina y harina blanca refinada, como panes, pastas, bocadillos y cereales. Evite todos los alimentos procesados. Carnes y otras protenas Cortes de carne con alto contenido de grasa. Carne de ave con piel. Carnes empanizadas o fritas. Carne procesada. Evite las grasas saturadas. Lcteos Yogur, queso o leche enteros. Bebidas Bebidas azucaradas, como gaseosas o t helado. Es posible que los productos que se enumeran ms arriba no constituyan una lista completa de los alimentos y las bebidas que debe evitar. Consulte a un nutricionista para obtener ms informacin. Preguntas para hacerle al mdico Debo consultar con un especialista certificado en atencin y educacin sobre la diabetes? Es necesario que me rena con un nutricionista? A qu nmero puedo llamar si tengo preguntas? Cules son los mejores momentos para controlar la glucemia? Dnde encontrar ms informacin: American Diabetes Association (Asociacin Estadounidense de la Diabetes): diabetes.org Academy of Nutrition and Dietetics (Academia de Nutricin y Diettica): eatright.org National Institute of Diabetes and Digestive and Kidney Diseases (Instituto Nacional de la Diabetes y las Enfermedades Digestivas y Renales): niddk.nih.gov Association of Diabetes   Care & Education Specialists (Asociacin de Especialistas en Atencin y Educacin sobre la Diabetes):  diabeteseducator.org Resumen Es importante tener hbitos alimenticios saludables debido a que sus niveles de azcar en la sangre (glucosa) se ven afectados en gran medida por lo que come y bebe. Es importante consumir alcohol con prudencia. Un plan de comidas saludable lo ayudar a controlar la glucosa en sangre y a reducir el riesgo de enfermedades cardacas. El mdico puede recomendarle que trabaje con un nutricionista para elaborar el mejor plan para usted. Esta informacin no tiene como fin reemplazar el consejo del mdico. Asegrese de hacerle al mdico cualquier pregunta que tenga. Document Revised: 01/24/2020 Document Reviewed: 01/24/2020 Elsevier Patient Education  2023 Elsevier Inc.  

## 2021-12-03 NOTE — Assessment & Plan Note (Signed)
Well-controlled hypertension. Continue Zestoretic 10-12.5 mg daily. BP Readings from Last 3 Encounters:  12/03/21 122/82  10/28/21 100/70  09/17/21 110/64  Well-controlled diabetes.  Hemoglobin A1c done today. Continue weekly Ozempic 1 mg. Not taking Wilder Glade or metformin due to kidney failure problems in the recent past. Advised to stay well-hydrated and avoid NSAIDs. Cardiovascular risks associated with diabetes and hypertension discussed. Diet and nutrition discussed. Follow-up in 3 months.

## 2021-12-03 NOTE — Assessment & Plan Note (Signed)
Stable.  Advised to decrease atorvastatin frequency due to occasional leg muscle cramps.  Advised to take atorvastatin 40 mg every other day. Diet and nutrition discussed.

## 2021-12-17 ENCOUNTER — Ambulatory Visit: Payer: BC Managed Care – PPO | Admitting: Emergency Medicine

## 2022-02-26 ENCOUNTER — Telehealth: Payer: Self-pay | Admitting: Emergency Medicine

## 2022-02-26 DIAGNOSIS — E1159 Type 2 diabetes mellitus with other circulatory complications: Secondary | ICD-10-CM

## 2022-02-26 MED ORDER — OZEMPIC (1 MG/DOSE) 4 MG/3ML ~~LOC~~ SOPN: 1.0000 mg | PEN_INJECTOR | SUBCUTANEOUS | 7 refills | Status: DC

## 2022-02-26 NOTE — Telephone Encounter (Signed)
Patient needs her ozempic sent into [facility] on [facility].

## 2022-02-26 NOTE — Telephone Encounter (Signed)
New prescription sent to pharmacy 

## 2022-03-04 ENCOUNTER — Ambulatory Visit: Payer: BC Managed Care – PPO | Admitting: Emergency Medicine

## 2022-03-05 ENCOUNTER — Ambulatory Visit: Payer: BC Managed Care – PPO | Admitting: Emergency Medicine

## 2022-03-11 ENCOUNTER — Encounter: Payer: Self-pay | Admitting: Emergency Medicine

## 2022-03-11 ENCOUNTER — Ambulatory Visit: Payer: BC Managed Care – PPO | Admitting: Emergency Medicine

## 2022-03-11 VITALS — BP 120/78 | HR 85 | Temp 98.1°F | Ht 65.0 in | Wt 218.1 lb

## 2022-03-11 DIAGNOSIS — Z23 Encounter for immunization: Secondary | ICD-10-CM

## 2022-03-11 DIAGNOSIS — Z1211 Encounter for screening for malignant neoplasm of colon: Secondary | ICD-10-CM

## 2022-03-11 DIAGNOSIS — E785 Hyperlipidemia, unspecified: Secondary | ICD-10-CM | POA: Diagnosis not present

## 2022-03-11 DIAGNOSIS — H00015 Hordeolum externum left lower eyelid: Secondary | ICD-10-CM | POA: Diagnosis not present

## 2022-03-11 DIAGNOSIS — E1169 Type 2 diabetes mellitus with other specified complication: Secondary | ICD-10-CM | POA: Diagnosis not present

## 2022-03-11 DIAGNOSIS — E1159 Type 2 diabetes mellitus with other circulatory complications: Secondary | ICD-10-CM

## 2022-03-11 DIAGNOSIS — I152 Hypertension secondary to endocrine disorders: Secondary | ICD-10-CM

## 2022-03-11 LAB — POCT GLYCOSYLATED HEMOGLOBIN (HGB A1C): Hemoglobin A1C: 11.1 % — AB (ref 4.0–5.6)

## 2022-03-11 MED ORDER — SEMAGLUTIDE (2 MG/DOSE) 8 MG/3ML ~~LOC~~ SOPN: 2.0000 mg | PEN_INJECTOR | SUBCUTANEOUS | 7 refills | Status: DC

## 2022-03-11 NOTE — Assessment & Plan Note (Signed)
Recommend warm compresses several times a day during the next couple days.

## 2022-03-11 NOTE — Assessment & Plan Note (Signed)
Well-controlled hypertension. Continue Zestoretic 10-12.5 mg daily BP Readings from Last 3 Encounters:  03/11/22 122/88  12/03/21 122/82  10/28/21 100/70  Uncontrolled diabetes with hemoglobin A1c higher than before at 11.1. Recommend to increase Ozempic to 2 mg weekly and increase Farxiga to 10 mg daily. Cardiovascular risks associated with uncontrolled diabetes discussed. Diet and nutrition discussed. Follow-up in 3 months.

## 2022-03-11 NOTE — Patient Instructions (Signed)
Increase Ozempic to 2 mg weekly Increase Farxiga to 10 mg daily Follow-up in 3 months  Diabetes mellitus y nutricin, en adultos Diabetes Mellitus and Nutrition, Adult Si sufre de diabetes, o diabetes mellitus, es muy importante tener hbitos alimenticios saludables debido a que sus niveles de Designer, television/film set sangre (glucosa) se ven afectados en gran medida por lo que come y bebe. Comer alimentos saludables en las cantidades correctas, aproximadamente a la misma hora todos los [place], [place] ayudar a: Chief Technology Officer su glucemia. Disminuir el riesgo de sufrir una enfermedad cardaca. Mejorar la presin arterial. Science writer o mantener un peso saludable. Qu puede afectar mi plan de alimentacin? Todas las personas que sufren de diabetes son diferentes y cada una tiene necesidades diferentes en cuanto a un plan de alimentacin. El mdico puede recomendarle que trabaje con un nutricionista para elaborar el mejor plan para usted. Su plan de alimentacin puede variar segn factores como: Las caloras que necesita. Los medicamentos que toma. Su peso. Sus niveles de glucemia, presin arterial y colesterol. Su nivel de [place]. Otras afecciones que tenga, como enfermedades cardacas o renales. Cmo me afectan los carbohidratos? Los carbohidratos, o hidratos de carbono, afectan su nivel de glucemia ms que cualquier otro tipo de alimento. La ingesta de carbohidratos aumenta la cantidad de [employer]. Es importante conocer la cantidad de carbohidratos que se pueden ingerir en cada comida sin correr Engineer, manufacturing. Esto es Psychologist, forensic. Su nutricionista puede ayudarlo a calcular la cantidad de carbohidratos que debe ingerir en cada comida y en cada refrigerio. Cmo me afecta el alcohol? El alcohol puede provocar una disminucin de la glucemia (hipoglucemia), especialmente si [place] insulina o toma determinados medicamentos por va oral para la diabetes. La hipoglucemia es una afeccin  potencialmente mortal. Los sntomas de la hipoglucemia, como somnolencia, mareos y confusin, son similares a los sntomas de haber consumido demasiado alcohol. No beba alcohol si: Su mdico le indica no hacerlo. Est embarazada, puede estar embarazada o est tratando de [place]. Si bebe alcohol: Limite la cantidad que bebe a lo siguiente: De 0 a 1 medida por da para las mujeres. De 0 a 2 medidas por da para los hombres. Sepa cunta cantidad de alcohol hay en las bebidas que toma. En los [place], una medida equivale a una botella de cerveza de 12 oz (355 ml), un vaso de vino de 5 oz (148 ml) o un vaso de una bebida alcohlica de alta graduacin de 1 oz (44 ml). Mantngase hidratado bebiendo agua, refrescos dietticos o t helado sin azcar. Tenga en cuenta que los refrescos comunes, los jugos y otras bebidas para mezclar pueden contener Freight forwarder y se deben contar como carbohidratos. Consejos para seguir Company secretary las etiquetas de los alimentos Comience por leer el tamao de la porcin en la etiqueta de Informacin nutricional de los alimentos envasados y las bebidas. La cantidad de caloras, carbohidratos, grasas y otros nutrientes detallados en la etiqueta se basan en una porcin del alimento. Muchos alimentos contienen ms de una porcin por envase. Verifique la cantidad total de gramos (g) de carbohidratos totales en una porcin. Verifique la cantidad de gramos de grasas saturadas y grasas trans en una porcin. Escoja alimentos que no contengan estas grasas o que su contenido de estas sea [place]. Verifique la cantidad de miligramos (mg) de sal (sodio) en una porcin. [place] [employer] de las personas deben limitar la ingesta de sodio total a menos de 2300 mg Honeywell. Siempre  consulte la informacin nutricional de los alimentos etiquetados como "con bajo contenido de grasa" o "sin grasa". Estos alimentos pueden tener un mayor contenido de Location manager agregada o carbohidratos  refinados, y deben evitarse. Hable con su nutricionista para identificar sus objetivos diarios en cuanto a los nutrientes mencionados en la etiqueta. Al ir de compras Evite comprar alimentos procesados, enlatados o precocidos. Estos alimentos tienden a Special educational needs teacher mayor cantidad de [place], sodio y azcar agregada. Compre en la zona exterior de la tienda de comestibles. Esta es la zona donde se encuentran con mayor frecuencia las frutas y las verduras frescas, los cereales a granel, las carnes frescas y los productos lcteos frescos. Al cocinar Use mtodos de coccin a baja temperatura, como hornear, en lugar de mtodos de coccin a alta temperatura, como frer en abundante aceite. Cocine con aceites saludables, como el aceite de [place], canola o [place]. Evite cocinar con manteca, crema o carnes con alto contenido de grasa. Planificacin de las comidas Coma las comidas y los refrigerios regularmente, preferentemente a la misma hora todos [place]. Evite pasar largos perodos de tiempo sin comer. Consuma alimentos ricos en fibra, como frutas frescas, verduras, frijoles y cereales integrales. Consuma entre 4 y 6 onzas (entre 112 y 168 g) de protenas magras por da, como carnes [place], pollo, pescado, huevos o tofu. Una onza (oz) (28 g) de protena magra equivale a: 1 onza (28 g) de carne, pollo o pescado. 1 huevo.  taza (62 g) de tofu. Coma algunos alimentos por da que contengan grasas saludables, como aguacates, frutos secos, semillas y pescado. Qu alimentos debo comer? Lambert Mody Bayas. Manzanas. Naranjas. Duraznos. Damascos. Ciruelas. Uvas. Mangos. Papayas. Granadas. Kiwi. Cerezas. Verduras Verduras de Boeing, que incluyen [place], [place], col rizada, acelga, hojas de berza, hojas de mostaza y repollo. Remolachas. Coliflor. Brcoli. Zanahorias. Judas verdes. Tomates. Pimientos. Cebollas. Pepinos. Coles de Bruselas. Granos Granos integrales, como panes, galletas, tortillas, cereales y  pastas de salvado o integrales. Avena sin azcar. Quinua. Arroz integral o salvaje. Carnes y otras protenas Frutos de mar. Carne de ave sin piel. Cortes magros de ave y carne de res. Tofu. Frutos secos. Semillas. Lcteos Productos lcteos sin grasa o con bajo contenido de [place], [place], yogur y [place]. Es posible que los productos detallados arriba no constituyan una lista completa de los alimentos y las bebidas que puede tomar. Consulte a un nutricionista para obtener ms informacin. Qu alimentos debo evitar? Lambert Mody Frutas enlatadas al almbar. Verduras Verduras enlatadas. Verduras congeladas con mantequilla o salsa de crema. Granos Productos elaborados con [place] y [place], como panes, pastas, bocadillos y cereales. Evite todos los alimentos procesados. Carnes y otras protenas Cortes de carne con alto contenido de Lobbyist. Carne de ave con piel. Carnes empanizadas o fritas. Carne procesada. Evite las grasas saturadas. Lcteos Yogur, [place] enteros. Bebidas Bebidas azucaradas, como gaseosas o t helado. Es posible que los productos que se enumeran ms [place] no constituyan una lista completa de los alimentos y las bebidas que Nurse, adult. Consulte a un nutricionista para obtener ms informacin. Preguntas para hacerle al mdico Debo consultar con un especialista certificado en atencin y educacin sobre la diabetes? Es necesario que me rena con un nutricionista? A qu nmero puedo llamar si tengo preguntas? Cules son los mejores momentos para controlar la glucemia? Dnde encontrar ms informacin: American Diabetes Association (Asociacin Estadounidense de la Diabetes): diabetes.org Academy of Nutrition and Dietetics (Academia de Nutricin y Information systems manager): eatright.Unisys Corporation of Diabetes and Digestive and  Kidney Diseases Countrywide Financial de la Diabetes y [place] y [place]): AmenCredit.is Association of Diabetes Care &  Education Specialists (Asociacin de Especialistas en Atencin y Educacin sobre la Diabetes): diabeteseducator.org Resumen Es importante tener hbitos alimenticios saludables debido a que sus niveles de Designer, television/film set sangre (glucosa) se ven afectados en gran medida por lo que come y bebe. Es importante consumir alcohol con prudencia. Un plan de comidas saludable lo ayudar a controlar la glucosa en sangre y a reducir el riesgo de enfermedades cardacas. El mdico puede recomendarle que trabaje con un nutricionista para elaborar el mejor plan para usted. Esta informacin no tiene Marine scientist el consejo del mdico. Asegrese de hacerle al mdico cualquier pregunta que tenga. Document Revised: 01/24/2020 Document Reviewed: 01/24/2020 Elsevier Patient Education  [place].

## 2022-03-11 NOTE — Progress Notes (Signed)
Jackie Wells 51 y.o.   Chief Complaint  Patient presents with   Follow-up    3 mnth f/u appt, patient has a red bump on her eyelid( left)     HISTORY OF PRESENT ILLNESS: This is a 51 y.o. female A1A here for 38-monthfollow-up of diabetes, hypertension, and dyslipidemia. No significant changes with her diet. Was off Ozempic for 3 weeks. Has small red bump to left lower eyelid for the last 2 weeks. No other complaints or medical concerns today. Lab Results  Component Value Date   HGBA1C 8.9 (H) 12/03/2021   BP Readings from Last 3 Encounters:  03/11/22 122/88  12/03/21 122/82  10/28/21 100/70     HPI   Prior to Admission medications   Medication Sig Start Date End Date Taking? Authorizing Provider  atorvastatin (LIPITOR) 40 MG tablet TAKE 1 TABLET BY MOUTH EVERY DAY 07/08/21  Yes Farzana Koci, MInes Bloomer MD  blood glucose meter kit and supplies KIT Per insurance preference. Check blood glucose once a day. E11.9 03/14/19  Yes SJacelyn Pi ILilia Argue MD  FARXIGA 5 MG TABS tablet TAKE 1 TABLET BY MOUTH DAILY BEFORE BREAKFAST. 07/08/21  Yes Carolyn Maniscalco, MInes Bloomer MD  lisinopril-hydrochlorothiazide (ZESTORETIC) 10-12.5 MG tablet TAKE 1 TABLET BY MOUTH EVERY DAY 07/08/21  Yes Javion Holmer, MCallaway MD  Semaglutide, 1 MG/DOSE, (OZEMPIC, 1 MG/DOSE,) 4 MG/3ML SOPN Inject 1 mg into the skin once a week. 02/26/22  Yes SHorald Pollen MD    No Known Allergies  Patient Active Problem List   Diagnosis Date Noted   Bilateral leg cramps 10/28/2021   Pelvic pain 06/19/2021   Varicose veins of left lower extremity with complications 016/03/9603  Diabetes type 2, uncontrolled 11/09/2014   Morbid obesity ([facility] 08/04/2014   Dyslipidemia associated with type 2 diabetes mellitus ([facility] 08/04/2014   Hypertension associated with diabetes ([facility] 08/04/2014    Past Medical History:  Diagnosis Date   Anemia    Diabetes mellitus without complication ([place] [place]    Hyperlipidemia    Hypertension      Past Surgical History:  Procedure Laterality Date   LAPAROSCOPIC UNILATERAL SALPINGO OOPHERECTOMY      Social History   Socioeconomic History   Marital status: Single    Spouse name: Not on file   Number of children: Not on file   Years of education: Not on file   Highest education level: Not on file  Occupational History   Not on file  Tobacco Use   Smoking status: Never   Smokeless tobacco: Never  Substance and Sexual Activity   Alcohol use: No    Alcohol/week: 0.0 standard drinks of alcohol   Drug use: No   Sexual activity: Yes    Birth control/protection: Post-menopausal  Other Topics Concern   Not on file  Social History Narrative   Not on file   Social Determinants of Health   Financial Resource Strain: Not on file  Food Insecurity: Not on file  Transportation Needs: Not on file  Physical Activity: Not on file  Stress: Not on file  Social Connections: Not on file  Intimate Partner Violence: Not on file    Family History  Problem Relation Age of Onset   Hyperlipidemia Mother    Hypertension Mother    Hypertension Father      Review of Systems  Constitutional: Negative.  Negative for chills and fever.  HENT: Negative.  Negative for congestion and sore throat.   Respiratory: Negative.  Negative for cough  and shortness of breath.   Cardiovascular: Negative.  Negative for chest pain and palpitations.  Gastrointestinal: Negative.  Negative for abdominal pain, nausea and vomiting.  Genitourinary: Negative.   Skin: Negative.  Negative for rash.  Neurological: Negative.  Negative for dizziness and headaches.  All other systems reviewed and are negative.   Today's Vitals   03/11/22 0921  BP: 122/88  Pulse: 85  Temp: 98.1 F (36.7 C)  TempSrc: Oral  SpO2: 94%  Weight: 218 lb 2 oz (98.9 kg)  Height: 5' 5"  (1.651 m)   Body mass index is 36.3 kg/m. Wt Readings from Last 3 Encounters:  03/11/22 218 lb 2 oz (98.9 kg)  12/03/21 220 lb 8 oz (100  kg)  10/28/21 219 lb 8 oz (99.6 kg)    Physical Exam Vitals reviewed.  Constitutional:      Appearance: Normal appearance.  HENT:     Head: Normocephalic.     Mouth/Throat:     Mouth: Mucous membranes are moist.     Pharynx: Oropharynx is clear.  Eyes:     Extraocular Movements: Extraocular movements intact.     Pupils: Pupils are equal, round, and reactive to light.     Comments: Small stye to left lower eyelid  Cardiovascular:     Rate and Rhythm: Normal rate and regular rhythm.     Pulses: Normal pulses.     Heart sounds: Normal heart sounds.  Pulmonary:     Effort: Pulmonary effort is normal.     Breath sounds: Normal breath sounds.  Abdominal:     Palpations: Abdomen is soft.     Tenderness: There is no abdominal tenderness.  Musculoskeletal:     Cervical back: No tenderness.  Lymphadenopathy:     Cervical: No cervical adenopathy.  Skin:    General: Skin is warm and dry.  Neurological:     General: No focal deficit present.     Mental Status: She is alert and oriented to person, place, and time.  Psychiatric:        Mood and Affect: Mood normal.        Behavior: Behavior normal.     Results for orders placed or performed in visit on 03/11/22 (from the past 24 hour(s))  POCT HgB A1C     Status: Abnormal   Collection Time: 03/11/22  9:33 AM  Result Value Ref Range   Hemoglobin A1C 11.1 (A) 4.0 - 5.6 %   HbA1c POC (<> result, manual entry)     HbA1c, POC (prediabetic range)     HbA1c, POC (controlled diabetic range)      ASSESSMENT & PLAN: A total of 46 minutes was spent with the patient and counseling/coordination of care regarding preparing for this visit, review of most recent office visit notes, review of most recent blood work results including interpretation of today's hemoglobin A1c, review of all medications and changes made, education on nutrition, cardiovascular risks associated with uncontrolled diabetes, prognosis, documentation and need for  follow-up in 3 months..  Problem List Items Addressed This Visit       Cardiovascular and Mediastinum   Hypertension associated with diabetes ([place]) - Primary    Well-controlled hypertension. Continue Zestoretic 10-12.5 mg daily BP Readings from Last 3 Encounters:  03/11/22 122/88  12/03/21 122/82  10/28/21 100/70  Uncontrolled diabetes with hemoglobin A1c higher than before at 11.1. Recommend to increase Ozempic to 2 mg weekly and increase Farxiga to 10 mg daily. Cardiovascular risks associated with uncontrolled diabetes discussed.  Diet and nutrition discussed. Follow-up in 3 months.       Relevant Medications   Semaglutide, 2 MG/DOSE, 8 MG/3ML SOPN     Endocrine   Dyslipidemia associated with type 2 diabetes mellitus ([place])    Stable.  Taking atorvastatin 40 mg daily without leg cramping. The 10-year ASCVD risk score (Arnett DK, et al., 2019) is: 2.7%   Values used to calculate the score:     Age: 35 years     Sex: Female     Is Non-Hispanic African American: No     Diabetic: Yes     Tobacco smoker: No     Systolic Blood Pressure: 459 mmHg     Is BP treated: Yes     HDL Cholesterol: 39.7 mg/dL     Total Cholesterol: 130 mg/dL       Relevant Medications   Semaglutide, 2 MG/DOSE, 8 MG/3ML SOPN   Other Relevant Orders   POCT HgB A1C (Completed)     Other   Hordeolum externum of left lower eyelid    Recommend warm compresses several times a day during the next couple days.      Other Visit Diagnoses     Need for vaccination       Relevant Orders   Flu Vaccine QUAD 6+ mos PF IM (Fluarix Quad PF) (Completed)   Zoster Recombinant (Shingrix ) (Completed)   Colon cancer screening       Relevant Orders   Ambulatory referral to Gastroenterology      Patient Instructions  Increase Ozempic to 2 mg weekly Increase Farxiga to 10 mg daily Follow-up in 3 months  Diabetes mellitus y nutricin, en adultos Diabetes Mellitus and Nutrition, Adult Si sufre de diabetes,  o diabetes mellitus, es muy importante tener hbitos alimenticios saludables debido a que sus niveles de Designer, television/film set sangre (glucosa) se ven afectados en gran medida por lo que come y bebe. [place] alimentos saludables en las cantidades correctas, aproximadamente a la misma hora todos los [place], [place] ayudar a: Chief Technology Officer su glucemia. Disminuir el riesgo de sufrir una enfermedad cardaca. Mejorar la presin arterial. Science writer o mantener un peso saludable. Qu puede afectar mi plan de alimentacin? Todas las personas que sufren de diabetes son diferentes y cada una tiene necesidades diferentes en cuanto a un plan de alimentacin. El mdico puede recomendarle que trabaje con un nutricionista para elaborar el mejor plan para usted. Su plan de alimentacin puede variar segn factores como: Las caloras que necesita. Los medicamentos que toma. Su peso. Sus niveles de glucemia, presin arterial y colesterol. Su nivel de [place]. Otras afecciones que tenga, como enfermedades cardacas o renales. Cmo me afectan los carbohidratos? Los carbohidratos, o hidratos de carbono, afectan su nivel de glucemia ms que cualquier otro tipo de alimento. La ingesta de carbohidratos aumenta la cantidad de [employer]. Es importante conocer la cantidad de carbohidratos que se pueden ingerir en cada comida sin correr Engineer, manufacturing. Esto es Psychologist, forensic. Su nutricionista puede ayudarlo a calcular la cantidad de carbohidratos que debe ingerir en cada comida y en cada refrigerio. Cmo me afecta el alcohol? El alcohol puede provocar una disminucin de la glucemia (hipoglucemia), especialmente si [place] insulina o toma determinados medicamentos por va oral para la diabetes. La hipoglucemia es una afeccin potencialmente mortal. Los sntomas de la hipoglucemia, como somnolencia, mareos y confusin, son similares a los sntomas de haber consumido demasiado alcohol. No beba alcohol si: Su mdico le indica no  hacerlo. Est embarazada, puede estar embarazada o est tratando de [place]. Si bebe alcohol: Limite la cantidad que bebe a lo siguiente: De 0 a 1 medida por da para las mujeres. De 0 a 2 medidas por da para los hombres. Sepa cunta cantidad de alcohol hay en las bebidas que toma. En los [place], una medida equivale a una botella de cerveza de 12 oz (355 ml), un vaso de vino de 5 oz (148 ml) o un vaso de una bebida alcohlica de alta graduacin de 1 oz (44 ml). Mantngase hidratado bebiendo agua, refrescos dietticos o t helado sin azcar. Tenga en cuenta que los refrescos comunes, los jugos y otras bebidas para mezclar pueden contener Freight forwarder y se deben contar como carbohidratos. Consejos para seguir Company secretary las etiquetas de los alimentos Comience por leer el tamao de la porcin en la etiqueta de Informacin nutricional de los alimentos envasados y las bebidas. La cantidad de caloras, carbohidratos, grasas y otros nutrientes detallados en la etiqueta se basan en una porcin del alimento. Muchos alimentos contienen ms de una porcin por envase. Verifique la cantidad total de gramos (g) de carbohidratos totales en una porcin. Verifique la cantidad de gramos de grasas saturadas y grasas trans en una porcin. Escoja alimentos que no contengan estas grasas o que su contenido de estas sea [place]. Verifique la cantidad de miligramos (mg) de sal (sodio) en una porcin. La State Farm de las personas deben limitar la ingesta de sodio total a menos de 2300 mg Honeywell. Siempre consulte la informacin nutricional de los alimentos etiquetados como "con bajo contenido de grasa" o "sin grasa". Estos alimentos pueden tener un mayor contenido de Location manager agregada o carbohidratos refinados, y deben evitarse. Hable con su nutricionista para identificar sus objetivos diarios en cuanto a los nutrientes mencionados en la etiqueta. Al ir de compras Evite comprar alimentos procesados,  enlatados o precocidos. Estos alimentos tienden a Special educational needs teacher mayor cantidad de [place], sodio y azcar agregada. Compre en la zona exterior de la tienda de comestibles. Esta es la zona donde se encuentran con mayor frecuencia las frutas y las verduras frescas, los cereales a granel, las carnes frescas y los productos lcteos frescos. Al cocinar Use mtodos de coccin a baja temperatura, como hornear, en lugar de mtodos de coccin a alta temperatura, como frer en abundante aceite. Cocine con aceites saludables, como el aceite de [place], [place] o [place]. Evite cocinar con manteca, crema o carnes con alto contenido de grasa. Planificacin de las comidas Coma las comidas y los refrigerios regularmente, preferentemente a la misma hora todos [place]. Evite pasar largos perodos de tiempo sin comer. Consuma alimentos ricos en fibra, como frutas frescas, verduras, frijoles y cereales integrales. Consuma entre 4 y 6 onzas (entre 112 y 168 g) de protenas magras por da, como carnes [place], pollo, pescado, huevos o tofu. Una onza (oz) (28 g) de protena magra equivale a: 1 onza (28 g) de carne, pollo o pescado. 1 huevo.  taza (62 g) de tofu. Coma algunos alimentos por da que contengan grasas saludables, como aguacates, frutos secos, semillas y pescado. Qu alimentos debo comer? Lambert Mody Bayas. Manzanas. Naranjas. Duraznos. Damascos. Ciruelas. Uvas. Mangos. Papayas. Granadas. Kiwi. Cerezas. Verduras Verduras de Boeing, que incluyen [place], [place], col rizada, acelga, hojas de berza, hojas de mostaza y repollo. Remolachas. Coliflor. Brcoli. Zanahorias. Judas verdes. Tomates. Pimientos. Cebollas. Pepinos. Coles de Bruselas. Granos Granos integrales, como panes, galletas, tortillas, cereales y pastas de salvado o  integrales. Avena sin azcar. Quinua. Arroz integral o salvaje. Carnes y otras protenas Frutos de mar. Carne de ave sin piel. Cortes magros de ave y carne de res. Tofu. Frutos secos.  Semillas. Lcteos Productos lcteos sin grasa o con bajo contenido de [place], [place], yogur y [place]. Es posible que los productos detallados arriba no constituyan una lista completa de los alimentos y las bebidas que puede tomar. Consulte a un nutricionista para obtener ms informacin. Qu alimentos debo evitar? Lambert Mody Frutas enlatadas al almbar. Verduras Verduras enlatadas. Verduras congeladas con mantequilla o salsa de crema. Granos Productos elaborados con [place] y [place], como panes, pastas, bocadillos y cereales. Evite todos los alimentos procesados. Carnes y otras protenas Cortes de carne con alto contenido de Lobbyist. Carne de ave con piel. Carnes empanizadas o fritas. Carne procesada. Evite las grasas saturadas. Lcteos Yogur, [place] enteros. Bebidas Bebidas azucaradas, como gaseosas o t helado. Es posible que los productos que se enumeran ms [place] no constituyan una lista completa de los alimentos y las bebidas que Nurse, adult. Consulte a un nutricionista para obtener ms informacin. Preguntas para hacerle al mdico Debo consultar con un especialista certificado en atencin y educacin sobre la diabetes? Es necesario que me rena con un nutricionista? A qu nmero puedo llamar si tengo preguntas? Cules son los mejores momentos para controlar la glucemia? Dnde encontrar ms informacin: American Diabetes Association (Asociacin Estadounidense de la Diabetes): diabetes.org Academy of Nutrition and Dietetics (Academia de Nutricin y Information systems manager): eatright.Unisys Corporation of Diabetes and Digestive and Kidney Diseases ([place] la Diabetes y [place] y [place]): AmenCredit.is Association of Diabetes Care & Education Specialists (Asociacin de Especialistas en Atencin y Educacin sobre la Diabetes): diabeteseducator.org Resumen Es importante tener hbitos alimenticios saludables debido a que sus niveles de  Designer, television/film set sangre (glucosa) se ven afectados en gran medida por lo que come y bebe. Es importante consumir alcohol con prudencia. Un plan de comidas saludable lo ayudar a controlar la glucosa en sangre y a reducir el riesgo de enfermedades cardacas. El mdico puede recomendarle que trabaje con un nutricionista para elaborar el mejor plan para usted. Esta informacin no tiene Marine scientist el consejo del mdico. Asegrese de hacerle al mdico cualquier pregunta que tenga. Document Revised: 01/24/2020 Document Reviewed: 01/24/2020 Elsevier Patient Education  Murray, MD [facility]

## 2022-03-11 NOTE — Assessment & Plan Note (Signed)
Stable.  Taking atorvastatin 40 mg daily without leg cramping. The 10-year ASCVD risk score (Arnett DK, et al., 2019) is: 2.7%   Values used to calculate the score:     Age: 51 years     Sex: Female     Is Non-Hispanic African American: No     Diabetic: Yes     Tobacco smoker: No     Systolic Blood Pressure: 295 mmHg     Is BP treated: Yes     HDL Cholesterol: 39.7 mg/dL     Total Cholesterol: 130 mg/dL

## 2022-03-30 ENCOUNTER — Other Ambulatory Visit: Payer: Self-pay | Admitting: Emergency Medicine

## 2022-03-30 ENCOUNTER — Encounter: Payer: Self-pay | Admitting: Gastroenterology

## 2022-03-30 DIAGNOSIS — Z1231 Encounter for screening mammogram for malignant neoplasm of breast: Secondary | ICD-10-CM

## 2022-03-31 ENCOUNTER — Other Ambulatory Visit: Payer: Self-pay | Admitting: *Deleted

## 2022-03-31 ENCOUNTER — Telehealth: Payer: Self-pay | Admitting: Emergency Medicine

## 2022-03-31 MED ORDER — SEMAGLUTIDE (1 MG/DOSE) 4 MG/3ML ~~LOC~~ SOPN: 4.0000 mg | PEN_INJECTOR | SUBCUTANEOUS | 2 refills | Status: DC

## 2022-03-31 NOTE — Telephone Encounter (Signed)
Okay to use 4 mg instead.

## 2022-03-31 NOTE — Telephone Encounter (Signed)
Patient called in and said that the pharmacy hadnt received a script for the Ozempic, I called the pharmacy and they said that they had received it but that they didn't have any of the '8mg'$  and that it was on backorder. They said they only have a few of the '4mg'$  left and that they mostly had the '2mg'$ . Please advise and let patient know what they should do

## 2022-03-31 NOTE — Telephone Encounter (Signed)
Called patient to inform her that a new rx for Ozempic has been sent to her pharmacy

## 2022-04-08 MED ORDER — TRULICITY 1.5 MG/0.5ML ~~LOC~~ SOAJ: 1.5000 mg | SUBCUTANEOUS | 1 refills | Status: DC

## 2022-04-08 NOTE — Telephone Encounter (Signed)
Patient called back and said that none of the pharmacies she's checked with have had it and they advised to call doctor to see about maybe another medication. Please advise. Call back is [phone]

## 2022-04-08 NOTE — Telephone Encounter (Signed)
1.5 mg weekly

## 2022-04-08 NOTE — Telephone Encounter (Signed)
New prescription sent to pharmacy 

## 2022-04-08 NOTE — Telephone Encounter (Signed)
Called patient and informed her of provider recommendation 

## 2022-04-08 NOTE — Telephone Encounter (Signed)
Recommend Trulicity.

## 2022-04-13 ENCOUNTER — Ambulatory Visit (AMBULATORY_SURGERY_CENTER): Payer: Self-pay

## 2022-04-13 VITALS — Ht 65.0 in | Wt 215.0 lb

## 2022-04-13 DIAGNOSIS — Z1211 Encounter for screening for malignant neoplasm of colon: Secondary | ICD-10-CM

## 2022-04-13 MED ORDER — NA SULFATE-K SULFATE-MG SULF 17.5-3.13-1.6 GM/177ML PO SOLN: 1.0000 | Freq: Once | ORAL | 0 refills | Status: AC

## 2022-04-13 NOTE — Progress Notes (Signed)
Spanish interpreter present for Pre Visit appt-Marta;  No egg or soy allergy known to patient;  No issues known to pt with past sedation with any surgeries or procedures--- other than PONV Patient denies ever being told they had issues or difficulty with intubation---other than PONV; No FH of Malignant Hyperthermia; Pt is not on diet pills; Pt is not on home 02;  Pt is not on blood thinners; Pt reports issues with constipation-patient reports she drinks teas to help with constipation- patient advised to increase oral fluids, activity, and fruits/veggies and to also take Miralax for 5 days prior to procedure;  No A fib or A flutter Have any cardiac testing pending--NO Pt instructed to use Singlecare.com or GoodRx for a price reduction on prep   Insurance verified during [place] appt=BCBS PPO  Patient's chart reviewed by Osvaldo Angst CNRA prior to previsit and patient appropriate for the [place].  Previsit completed and red dot placed by patient's name on their procedure day (on provider's schedule).    SingleCare coupon card given to patient at [facility] appt;

## 2022-04-14 ENCOUNTER — Encounter: Payer: Self-pay | Admitting: Internal Medicine

## 2022-04-16 ENCOUNTER — Ambulatory Visit
Admission: RE | Admit: 2022-04-16 | Discharge: 2022-04-16 | Disposition: A | Discharge status: Home / Self Care | Payer: BC Managed Care – PPO | Source: Ambulatory Visit | Attending: Emergency Medicine | Admitting: Emergency Medicine

## 2022-04-16 DIAGNOSIS — Z1231 Encounter for screening mammogram for malignant neoplasm of breast: Secondary | ICD-10-CM

## 2022-04-17 ENCOUNTER — Encounter: Payer: Self-pay | Admitting: Internal Medicine

## 2022-04-17 ENCOUNTER — Ambulatory Visit (AMBULATORY_SURGERY_CENTER): Payer: BC Managed Care – PPO | Admitting: Internal Medicine

## 2022-04-17 VITALS — BP 100/50 | HR 67 | Temp 97.3°F | Resp 11 | Ht 65.0 in | Wt 215.0 lb

## 2022-04-17 DIAGNOSIS — Z1211 Encounter for screening for malignant neoplasm of colon: Secondary | ICD-10-CM | POA: Diagnosis not present

## 2022-04-17 DIAGNOSIS — D120 Benign neoplasm of cecum: Secondary | ICD-10-CM

## 2022-04-17 DIAGNOSIS — K621 Rectal polyp: Secondary | ICD-10-CM

## 2022-04-17 DIAGNOSIS — D122 Benign neoplasm of ascending colon: Secondary | ICD-10-CM | POA: Diagnosis not present

## 2022-04-17 DIAGNOSIS — D123 Benign neoplasm of transverse colon: Secondary | ICD-10-CM

## 2022-04-17 MED ORDER — SODIUM CHLORIDE 0.9 % IV SOLN: 500.0000 mL | Freq: Once | INTRAVENOUS | Status: DC

## 2022-04-17 NOTE — Progress Notes (Signed)
Sedate, gd SR, tolerated procedure well, VSS, report to RN 

## 2022-04-17 NOTE — Op Note (Signed)
[facility] Patient Name: Jackie Wells Procedure Date: 04/17/2022 9:33 AM MRN: [id number] Endoscopist: Georgian Co , , [id number] Age: 51 Referring MD:  Date of Birth: 1970-08-03 Gender: Female Account #: [id number] Procedure:                Colonoscopy Indications:              Screening for colorectal malignant neoplasm, This                            is the patient's first colonoscopy Medicines:                Monitored Anesthesia Care Procedure:                Pre-Anesthesia Assessment:                           - Prior to the procedure, a History and Physical                            was performed, and patient medications and                            allergies were reviewed. The patient's tolerance of                            previous anesthesia was also reviewed. The risks                            and benefits of the procedure and the sedation                            options and risks were discussed with the patient.                            All questions were answered, and informed consent                            was obtained. Prior Anticoagulants: The patient has                            taken no anticoagulant or antiplatelet agents. ASA                            Grade Assessment: II - A patient with mild systemic                            disease. After reviewing the risks and benefits,                            the patient was deemed in satisfactory condition to                            undergo the procedure.  After obtaining informed consent, the colonoscope                            was passed under direct vision. Throughout the                            procedure, the patient's blood pressure, pulse, and                            oxygen saturations were monitored continuously. The                            CF [id number] #[id number] was introduced through the anus                            and  advanced to the the terminal ileum. The                            colonoscopy was performed without difficulty. The                            patient tolerated the procedure well. The quality                            of the bowel preparation was good. The terminal                            ileum, ileocecal valve, appendiceal orifice, and                            rectum were photographed. Scope In: 9:43:45 AM Scope Out: 10:04:40 AM Scope Withdrawal Time: 0 hours 17 minutes 19 seconds  Total Procedure Duration: 0 hours 20 minutes 55 seconds  Findings:                 The terminal ileum appeared normal.                           Five sessile polyps were found in the transverse                            colon, ascending colon and cecum. The polyps were 3                            to 7 mm in size. These polyps were removed with a                            cold snare. Resection and retrieval were complete.                           A 3 mm polyp was found in the rectum. The polyp was  sessile. The polyp was removed with a cold snare.                            Resection and retrieval were complete.                           Non-bleeding internal hemorrhoids were found during                            retroflexion. Complications:            No immediate complications. Estimated Blood Loss:     Estimated blood loss was minimal. Impression:               - The examined portion of the ileum was normal.                           - Five 3 to 7 mm polyps in the transverse colon, in                            the ascending colon and in the cecum, removed with                            a cold snare. Resected and retrieved.                           - One 3 mm polyp in the rectum, removed with a cold                            snare. Resected and retrieved.                           - Non-bleeding internal hemorrhoids. Recommendation:           - Discharge patient to  home (with escort).                           - Await pathology results.                           - The findings and recommendations were discussed                            with the patient. Dr Georgian Co "Lyndee Leo" Lorenso Courier,  04/17/2022 10:08:35 AM

## 2022-04-17 NOTE — Progress Notes (Signed)
GASTROENTEROLOGY PROCEDURE H&P NOTE   Primary Care Physician: Horald Pollen, MD    Reason for Procedure:   Colon cancer screening  Plan:    Colonoscopy  Patient is appropriate for endoscopic procedure(s) in the ambulatory ([facility]) setting.  The nature of the procedure, as well as the risks, benefits, and alternatives were carefully and thoroughly reviewed with the patient. Ample time for discussion and questions allowed. The patient understood, was satisfied, and agreed to proceed.     HPI: Jackie Wells is a 51 y.o. female who presents for colonoscopy for colon cancer screening. Denies blood in stools, changes in bowel habits, or unintentional weight loss. She has had longstanding constipation and has lost some weight recently due to use of Ozempic. Denies family history of colon cancer.   Past Medical History:  Diagnosis Date   Anemia    Diabetes mellitus without complication ([place])    on meds   GERD (gastroesophageal reflux disease)    with certain foods   Hyperlipidemia    Hypertension    PONV (postoperative nausea and vomiting)     Past Surgical History:  Procedure Laterality Date   CESAREAN SECTION  1994   2008   [place]    Prior to Admission medications   Medication Sig Start Date End Date Taking? Authorizing Provider  atorvastatin (LIPITOR) 40 MG tablet TAKE 1 TABLET BY MOUTH EVERY DAY 07/08/21   Horald Pollen, MD  blood glucose meter kit and supplies KIT Per insurance preference. Check blood glucose once a day. E11.9 03/14/19   Jacelyn Pi, Lilia Argue, MD  Dulaglutide (TRULICITY) 1.5 PP/5.0DT SOPN Inject 1.5 mg into the skin once a week. 04/08/22   Sagardia, Ines Bloomer, MD  FARXIGA 5 MG TABS tablet TAKE 1 TABLET BY MOUTH DAILY BEFORE BREAKFAST. Patient taking differently: Take 10 mg by mouth daily. 07/08/21   Horald Pollen, MD  lisinopril-hydrochlorothiazide  (ZESTORETIC) 10-12.5 MG tablet TAKE 1 TABLET BY MOUTH EVERY DAY 07/08/21   Horald Pollen, MD    Current Outpatient Medications  Medication Sig Dispense Refill   atorvastatin (LIPITOR) 40 MG tablet TAKE 1 TABLET BY MOUTH EVERY DAY 90 tablet 3   blood glucose meter kit and supplies KIT Per insurance preference. Check blood glucose once a day. E11.9 1 each 5   FARXIGA 5 MG TABS tablet TAKE 1 TABLET BY MOUTH DAILY BEFORE BREAKFAST. (Patient taking differently: Take 10 mg by mouth daily.) 90 tablet 3   lisinopril-hydrochlorothiazide (ZESTORETIC) 10-12.5 MG tablet TAKE 1 TABLET BY MOUTH EVERY DAY 90 tablet 3   Dulaglutide (TRULICITY) 1.5 OI/7.1IW SOPN Inject 1.5 mg into the skin once a week. 6 mL 1   Current Facility-Administered Medications  Medication Dose Route Frequency Provider Last Rate Last Admin   0.9 %  sodium chloride infusion  500 mL Intravenous Once Sharyn Creamer, MD        Allergies as of 04/17/2022   (No Known Allergies)    Family History  Problem Relation Age of Onset   Colon polyps Mother    Hyperlipidemia Mother    Hypertension Mother    Hypertension Father    Colon cancer Neg Hx    Esophageal cancer Neg Hx    Rectal cancer Neg Hx    Stomach cancer Neg Hx     Social History   Socioeconomic History   Marital status: Single    Spouse name: Not on file  Number of children: Not on file   Years of education: Not on file   Highest education level: Not on file  Occupational History   Not on file  Tobacco Use   Smoking status: Never   Smokeless tobacco: Never  Vaping Use   Vaping Use: Never used  Substance and Sexual Activity   Alcohol use: No    Alcohol/week: 0.0 standard drinks of alcohol   Drug use: No   Sexual activity: Yes    Birth control/protection: Post-menopausal  Other Topics Concern   Not on file  Social History Narrative   Not on file   Social Determinants of Health   Financial Resource Strain: Not on file  Food Insecurity: Not on  file  Transportation Needs: Not on file  Physical Activity: Not on file  Stress: Not on file  Social Connections: Not on file  Intimate Partner Violence: Not on file    Physical Exam: Vital signs in last 24 hours: BP (!) 154/83   Pulse 83   Temp (!) 97.3 F (36.3 C) (Skin)   Ht _0  (1.651 m)   Wt 215 lb (97.5 kg)   LMP  (LMP Unknown)   SpO2 100%   BMI 35.78 kg/m  GEN: NAD EYE: Sclerae anicteric ENT: MMM CV: Non-tachycardic Pulm: No increased work of breathing GI: Soft, NT/ND NEURO:  Alert & Oriented   Christia Reading, MD [facility]  04/17/2022 9:27 AM

## 2022-04-17 NOTE — Progress Notes (Signed)
Called to room to assist during endoscopic procedure.  Patient ID and intended procedure confirmed with present staff. Received instructions for my participation in the procedure from the performing physician.  

## 2022-04-17 NOTE — Progress Notes (Signed)
Pt's states no medical or surgical changes since previsit or office visit. VS assessed by D.T 

## 2022-04-17 NOTE — Patient Instructions (Signed)
USTED TUVO UN PROCEDIMIENTO ENDOSCPICO HOY EN EL [facility]:   Lea el informe del procedimiento que se le entreg para cualquier pregunta especfica sobre lo que se encontr durante su examen.  Si el informe del examen no responde a sus preguntas, por favor llame a su gastroenterlogo para aclararlo.  Si usted solicit que no se le den detalles de lo que se encontr en su procedimiento al acompaante que le va a cuidar, entonces el informe del procedimiento se ha incluido en un sobre sellado para que usted lo revise despus cuando le sea ms conveniente.   LO QUE PUEDE ESPERAR: Algunas sensaciones de hinchazn en el abdomen.  Puede tener ms gases de lo normal.  El caminar puede ayudarle a eliminar el aire que se le puso en el tracto gastrointestinal durante el procedimiento y reducir la hinchazn.  Si le hicieron una endoscopia inferior (como una colonoscopia o una sigmoidoscopia flexible), podra notar manchas de sangre en las heces fecales o en el papel higinico.  Si se someti a una preparacin intestinal para su procedimiento, es posible que no tenga una evacuacin intestinal normal durante algunos das.   Tenga en cuenta:  Es posible que note un poco de irritacin y congestin en la nariz o algn drenaje.  Esto es debido al oxgeno utilizado durante su procedimiento.  No hay que preocuparse y esto debe desaparecer ms o menos en un da.   SNTOMAS PARA REPORTAR INMEDIATAMENTE:  Despus de una endoscopia inferior (colonoscopia o sigmoidoscopia flexible):  Cantidades excesivas de sangre en las heces fecales  Sensibilidad significativa o empeoramiento de los dolores abdominales   Hinchazn aguda del abdomen que antes no tena   Fiebre de 100F o ms    Para asuntos urgentes o de emergencia, puede comunicarse con un gastroenterlogo a cualquier hora llamando al [phone].  DIETA:  Recomendamos una comida pequea al principio, pero luego puede continuar con su dieta normal.   Tome muchos lquidos, pero debe evitar las bebidas alcohlicas durante 24 horas.    ACTIVIDAD:  Debe planear tomarse las cosas con calma por el resto del da y no debe CONDUCIR ni usar maquinaria pesada hasta maana (debido a los medicamentos de sedacin utilizados durante el examen).     SEGUIMIENTO: Nuestro personal llamar al nmero que aparece en su historial al siguiente da hbil de su procedimiento para ver cmo se siente y para responder cualquier pregunta o inquietud que pueda tener con respecto a la informacin que se le dio despus del procedimiento. Si no podemos contactarle, le dejaremos un mensaje.  Sin embargo, si se siente bien y no tiene ningn problema, no es necesario que nos devuelva la llamada.  Asumiremos que ha regresado a sus actividades diarias normales sin incidentes. Si se le tomaron algunas biopsias, le contactaremos por telfono o por carta en las prximas 3 semanas.  Si no ha sabido nada sobre las biopsias en el transcurso de 3 semanas, por favor llmenos al [phone].   FIRMAS/CONFIDENCIALIDAD: Usted y/o el acompaante que le cuide han firmado documentos que se ingresarn en su historial mdico electrnico.  Estas firmas atestiguan el hecho de que la informacin anterior  

## 2022-04-20 ENCOUNTER — Telehealth: Payer: Self-pay

## 2022-04-20 NOTE — Telephone Encounter (Signed)
  Follow up Call-     04/17/2022    8:49 AM  Call back number  Post procedure Call Back phone  # [phone]  Permission to leave phone message Yes     Patient questions:  Do you have a fever, pain , or abdominal swelling? No. Pain Score  0 *  Have you tolerated food without any problems? Yes.    Have you been able to return to your normal activities? Yes.    Do you have any questions about your discharge instructions: Diet   No. Medications  No. Follow up visit  No.  Do you have questions or concerns about your Care? No.  Actions: * If pain score is 4 or above: No action needed, pain <4.

## 2022-04-22 ENCOUNTER — Encounter: Payer: Self-pay | Admitting: Internal Medicine

## 2022-05-05 ENCOUNTER — Encounter: Payer: Self-pay | Admitting: Emergency Medicine

## 2022-05-05 ENCOUNTER — Encounter: Payer: BC Managed Care – PPO | Admitting: Gastroenterology

## 2022-05-05 ENCOUNTER — Ambulatory Visit: Payer: BC Managed Care – PPO | Admitting: Emergency Medicine

## 2022-05-05 VITALS — BP 132/80 | HR 95 | Temp 98.8°F | Ht 65.0 in | Wt 213.4 lb

## 2022-05-05 DIAGNOSIS — K648 Other hemorrhoids: Secondary | ICD-10-CM | POA: Diagnosis not present

## 2022-05-05 DIAGNOSIS — I152 Hypertension secondary to endocrine disorders: Secondary | ICD-10-CM

## 2022-05-05 DIAGNOSIS — D126 Benign neoplasm of colon, unspecified: Secondary | ICD-10-CM

## 2022-05-05 DIAGNOSIS — E785 Hyperlipidemia, unspecified: Secondary | ICD-10-CM

## 2022-05-05 DIAGNOSIS — E1169 Type 2 diabetes mellitus with other specified complication: Secondary | ICD-10-CM

## 2022-05-05 DIAGNOSIS — E1159 Type 2 diabetes mellitus with other circulatory complications: Secondary | ICD-10-CM

## 2022-05-05 NOTE — Assessment & Plan Note (Signed)
Stable.  Diet and nutrition discussed. Continue atorvastatin 40 mg daily. The 10-year ASCVD risk score (Arnett DK, et al., 2019) is: 3.2%   Values used to calculate the score:     Age: 51 years     Sex: Female     Is Non-Hispanic African American: No     Diabetic: Yes     Tobacco smoker: No     Systolic Blood Pressure: 230 mmHg     Is BP treated: Yes     HDL Cholesterol: 39.7 mg/dL     Total Cholesterol: 130 mg/dL

## 2022-05-05 NOTE — Assessment & Plan Note (Signed)
Well-controlled hypertension. Continue Zestoretic 10-12.5 mg daily Well-controlled diabetes Continue weekly Trulicity 1.5 mg Cardiovascular risks associated with hypertension and diabetes discussed. Diet and nutrition discussed.

## 2022-05-05 NOTE — Progress Notes (Signed)
Jackie Wells 51 y.o.   Chief Complaint  Patient presents with   Follow-up    F/u colonoscopy results.     HISTORY OF PRESENT ILLNESS: This is a 51 y.o. female A1A here for follow-up of colonoscopy results. Colonoscopy done on 04/17/2022.  Report reviewed with patient.  Shows multiple polyps.  Pathology negative for cancer. Also showed internal hemorrhoids. Patient doing well.  Has no other complaints or medical concerns today.  HPI   Prior to Admission medications   Medication Sig Start Date End Date Taking? Authorizing Provider  atorvastatin (LIPITOR) 40 MG tablet TAKE 1 TABLET BY MOUTH EVERY DAY 07/08/21  Yes Marinell Igarashi, Ines Bloomer, MD  blood glucose meter kit and supplies KIT Per insurance preference. Check blood glucose once a day. E11.9 03/14/19  Yes Jacelyn Pi, Irma M, MD  Dulaglutide (TRULICITY) 1.5 ZO/1.0RU SOPN Inject 1.5 mg into the skin once a week. 04/08/22  Yes Jelesa Mangini, Ines Bloomer, MD  FARXIGA 5 MG TABS tablet TAKE 1 TABLET BY MOUTH DAILY BEFORE BREAKFAST. Patient taking differently: Take 10 mg by mouth daily. 07/08/21  Yes Horald Pollen, MD  lisinopril-hydrochlorothiazide (ZESTORETIC) 10-12.5 MG tablet TAKE 1 TABLET BY MOUTH EVERY DAY 07/08/21  Yes Horald Pollen, MD    No Known Allergies  Patient Active Problem List   Diagnosis Date Noted   Hordeolum externum of left lower eyelid 03/11/2022   Bilateral leg cramps 10/28/2021   Pelvic pain 06/19/2021   Varicose veins of left lower extremity with complications 04/54/0981   Diabetes type 2, uncontrolled 11/09/2014   Morbid obesity ([place]) 08/04/2014   Dyslipidemia associated with type 2 diabetes mellitus ([place]) 08/04/2014   Hypertension associated with diabetes ([place]) 08/04/2014    Past Medical History:  Diagnosis Date   Anemia    Diabetes mellitus without complication ([place])    on meds   GERD (gastroesophageal reflux disease)    with certain foods   Hyperlipidemia    Hypertension     PONV (postoperative nausea and vomiting)     Past Surgical History:  Procedure Laterality Date   CESAREAN SECTION  1994   2008   LAPAROSCOPIC UNILATERAL SALPINGO OOPHERECTOMY  1993   TONSILLECTOMY  1984    Social History   Socioeconomic History   Marital status: Single    Spouse name: Not on file   Number of children: Not on file   Years of education: Not on file   Highest education level: Not on file  Occupational History   Not on file  Tobacco Use   Smoking status: Never   Smokeless tobacco: Never  Vaping Use   Vaping Use: Never used  Substance and Sexual Activity   Alcohol use: No    Alcohol/week: 0.0 standard drinks of alcohol   Drug use: No   Sexual activity: Yes    Birth control/protection: Post-menopausal  Other Topics Concern   Not on file  Social History Narrative   Not on file   Social Determinants of Health   Financial Resource Strain: Not on file  Food Insecurity: Not on file  Transportation Needs: Not on file  Physical Activity: Not on file  Stress: Not on file  Social Connections: Not on file  Intimate Partner Violence: Not on file    Family History  Problem Relation Age of Onset   Colon polyps Mother    Hyperlipidemia Mother    Hypertension Mother    Hypertension Father    Colon cancer Neg Hx    Esophageal  cancer Neg Hx    Rectal cancer Neg Hx    Stomach cancer Neg Hx      Review of Systems  Constitutional: Negative.  Negative for chills and fever.  HENT: Negative.  Negative for congestion and sore throat.   Respiratory: Negative.  Negative for cough and shortness of breath.   Cardiovascular: Negative.  Negative for chest pain and palpitations.  Gastrointestinal: Negative.  Negative for abdominal pain, diarrhea, nausea and vomiting.  Genitourinary: Negative.  Negative for dysuria and hematuria.  Skin: Negative.  Negative for rash.  Neurological:  Negative for dizziness and headaches.  All other systems reviewed and are  negative.  Today's Vitals   05/05/22 1602  BP: 132/80  Pulse: 95  Temp: 98.8 F (37.1 C)  TempSrc: Oral  SpO2: 99%  Weight: 213 lb 6 oz (96.8 kg)  Height: _0  (1.651 m)   Body mass index is 35.51 kg/m.   Physical Exam Vitals reviewed.  Constitutional:      Appearance: Normal appearance.  HENT:     Head: Normocephalic.  Eyes:     Extraocular Movements: Extraocular movements intact.  Cardiovascular:     Rate and Rhythm: Normal rate.  Pulmonary:     Effort: Pulmonary effort is normal.  Skin:    General: Skin is warm and dry.  Neurological:     General: No focal deficit present.     Mental Status: She is alert and oriented to person, place, and time.  Psychiatric:        Mood and Affect: Mood normal.        Behavior: Behavior normal.      ASSESSMENT & PLAN: A total of 40 minutes was spent with the patient and counseling/coordination of care regarding preparing for this visit, review of most recent office visit notes, review of most recent colonoscopy report, review of pathology report, review of chronic medical conditions under management, review of all medications, education on nutrition and need to increase amount of fiber/water in her diet, prognosis, documentation and need for follow-up.  Problem List Items Addressed This Visit       Cardiovascular and Mediastinum   Hypertension associated with diabetes ([place])    Well-controlled hypertension. Continue Zestoretic 10-12.5 mg daily Well-controlled diabetes Continue weekly Trulicity 1.5 mg Cardiovascular risks associated with hypertension and diabetes discussed. Diet and nutrition discussed.      Internal hemorrhoids    No complications. Asymptomatic. No specific treatment at this time.        Digestive   Adenomatous polyp of colon - Primary    Benign polyps. No complications. Resected.        Endocrine   Dyslipidemia associated with type 2 diabetes mellitus ([place])    Stable.  Diet and nutrition  discussed. Continue atorvastatin 40 mg daily. The 10-year ASCVD risk score (Arnett DK, et al., 2019) is: 3.2%   Values used to calculate the score:     Age: 77 years     Sex: Female     Is Non-Hispanic African American: No     Diabetic: Yes     Tobacco smoker: No     Systolic Blood Pressure: 956 mmHg     Is BP treated: Yes     HDL Cholesterol: 39.7 mg/dL     Total Cholesterol: 130 mg/dL       Patient Instructions  Mantenimiento de la salud en [place] Maintenance, Female Adoptar un estilo de vida saludable y recibir atencin preventiva son importantes para  promover la salud y [place]. Consulte al mdico sobre: El esquema adecuado para hacerse pruebas y exmenes peridicos. Cosas que puede hacer por su cuenta para prevenir enfermedades y [place] sano. Qu debo saber sobre la dieta, el peso y el ejercicio? Consuma una dieta saludable  Consuma una dieta que incluya muchas verduras, frutas, productos lcteos con bajo contenido de [place] y Advertising account planner. No consuma muchos alimentos ricos en grasas slidas, azcares agregados o sodio. Mantenga un peso saludable El ndice de masa muscular [facility]) se [place] para identificar problemas de [place]. Proporciona una estimacin de la grasa corporal basndose en el peso y la altura. Su mdico puede ayudarle a Radiation protection practitioner [place] y a Scientist, forensic o Theatre manager un peso saludable. Haga ejercicio con regularidad Haga ejercicio con regularidad. Esta es una de las prcticas ms importantes que puede hacer por su salud. La [employer] de los adultos deben seguir estas pautas: Optometrist, al menos, 150 minutos de actividad fsica por semana. El ejercicio debe aumentar la frecuencia cardaca y Nature conservation officer transpirar (ejercicio de intensidad moderada). Hacer ejercicios de fortalecimiento por lo [employer] por semana. Agregue esto a su plan de ejercicio de intensidad moderada. Pase menos tiempo sentada. Incluso la actividad fsica ligera puede ser  beneficiosa. Controle sus niveles de colesterol y lpidos en la sangre Comience a realizarse anlisis de lpidos y Research officer, trade union en la sangre a los 15 aos y luego reptalos cada 5 aos. Hgase controlar los niveles de colesterol con mayor frecuencia si: Sus niveles de lpidos y colesterol son altos. Es mayor de 85 aos. Presenta un alto riesgo de padecer enfermedades cardacas. Qu debo saber sobre las pruebas de deteccin del cncer? Segn su historia clnica y sus antecedentes familiares, es posible que deba realizarse pruebas de deteccin del cncer en diferentes edades. Esto puede incluir pruebas de deteccin de lo siguiente: Cncer de mama. Cncer de cuello uterino. Cncer colorrectal. Cncer de piel. Cncer de pulmn. Qu debo saber sobre la enfermedad cardaca, la diabetes y la hipertensin arterial? Presin arterial y enfermedad cardaca La hipertensin arterial causa enfermedades cardacas y [place] el riesgo de accidente cerebrovascular. Es ms probable que esto se manifieste en las personas que tienen lecturas de presin arterial alta o tienen sobrepeso. Hgase controlar la presin arterial: Cada 3 a 5 aos si tiene entre 18 y 52 aos. Todos los aos si es mayor de 40 aos. Diabetes Realcese exmenes de deteccin de la diabetes con regularidad. Este anlisis revisa el nivel de azcar en la sangre en [place]. Hgase las pruebas de deteccin: Cada tres aos despus de los 11 aos de edad si tiene un peso normal y un bajo riesgo de padecer diabetes. Con ms frecuencia y a partir de [place] edad inferior si tiene sobrepeso o un alto riesgo de padecer diabetes. Qu debo saber sobre la prevencin de infecciones? Hepatitis B Si tiene un riesgo ms alto de contraer hepatitis B, debe someterse a un examen de deteccin de este virus. Hable con el mdico para averiguar si tiene riesgo de contraer la infeccin por hepatitis B. Hepatitis C Se recomienda el anlisis a: [employer] 1945 y 1965. Todas las personas que tengan un riesgo de haber contrado hepatitis C. Enfermedades de transmisin sexual (ETS) Hgase las pruebas de Programme researcher, broadcasting/film/video de ITS, incluidas la gonorrea y la clamidia, si: Es sexualmente activa y es menor de 76 aos. Es mayor de 34 aos, y Investment banker, operational informa que corre riesgo de tener este tipo de infecciones. La [place]  sexual ha cambiado desde que le hicieron la ltima prueba de deteccin y tiene un riesgo mayor de Best boy clamidia o Radio broadcast assistant. Pregntele al mdico si usted tiene riesgo. Pregntele al mdico si usted tiene un alto riesgo de Museum/gallery curator VIH. El mdico tambin puede recomendarle un medicamento recetado para ayudar a evitar la infeccin por el VIH. Si elige tomar medicamentos para prevenir el VIH, primero debe [employer] de deteccin del VIH. Luego debe hacerse anlisis cada 3 meses mientras est tomando los medicamentos. Embarazo Si est por dejar de Librarian, academic (fase premenopusica) y usted puede quedar [place], busque asesoramiento antes de [place]. Tome de 400 a 800 microgramos (mcg) de cido [employer] si [place]. Pida mtodos de control de la natalidad (anticonceptivos) si desea evitar un embarazo no deseado. Osteoporosis y [place] La osteoporosis es una enfermedad en la que los huesos pierden los minerales y la fuerza por el avance de la edad. El resultado pueden ser fracturas en los [place]. Si tiene 68 aos o ms, o si est en riesgo de sufrir osteoporosis y fracturas, pregunte a su mdico si debe: Hacerse pruebas de deteccin de prdida sea. Tomar un suplemento de calcio o de vitamina D para reducir el riesgo de fracturas. Recibir terapia de reemplazo hormonal (TRH) para tratar los sntomas de la menopausia. Siga estas indicaciones en su casa: Consumo de alcohol No beba alcohol si: Su mdico le indica no hacerlo. Est embarazada, puede estar embarazada o est tratando de [place]. Si bebe alcohol: Limite la cantidad que bebe a lo siguiente: De 0 a 1 bebida por da. Sepa cunta cantidad de alcohol hay en las bebidas que toma. En los [place], una medida equivale a una botella de cerveza de 12 oz (355 ml), un vaso de vino de 5 oz (148 ml) o un vaso de una bebida alcohlica de alta graduacin de 1 oz (44 ml). Estilo de vida No consuma ningn producto que contenga nicotina o tabaco. Estos productos incluyen cigarrillos, tabaco para Higher education careers adviser y aparatos de vapeo, como los Psychologist, sport and exercise. Si necesita ayuda para dejar de consumir estos productos, consulte al mdico. No consuma drogas. No comparta agujas. Solicite ayuda a su mdico si necesita apoyo o informacin para abandonar las drogas. Indicaciones generales Realcese los estudios de rutina de la salud, dentales y de Public librarian. [place]. Infrmele a su mdico si: Se siente deprimida con frecuencia. Alguna vez ha sido vctima de [place] o no se siente seguro en su casa. Resumen Adoptar un estilo de vida saludable y recibir atencin preventiva son importantes para promover la salud y Musician. Siga las instrucciones del mdico acerca de una dieta saludable, el ejercicio y la realizacin de pruebas o exmenes para Engineer, building services. Siga las instrucciones del mdico con respecto al control del colesterol y la presin arterial. Esta informacin no tiene Marine scientist el consejo del mdico. Asegrese de hacerle al mdico cualquier pregunta que tenga. Document Revised: 10/24/2020 Document Reviewed: 10/24/2020 Elsevier Patient Education  [place], MD [facility]

## 2022-05-05 NOTE — Assessment & Plan Note (Signed)
No complications. Asymptomatic. No specific treatment at this time.

## 2022-05-05 NOTE — Patient Instructions (Signed)

## 2022-05-05 NOTE — Assessment & Plan Note (Signed)
Benign polyps. No complications. Resected.

## 2022-06-08 ENCOUNTER — Telehealth: Payer: Self-pay | Admitting: Emergency Medicine

## 2022-06-08 MED ORDER — DAPAGLIFLOZIN PROPANEDIOL 10 MG PO TABS: 10.0000 mg | ORAL_TABLET | Freq: Every day | ORAL | 3 refills | Status: DC

## 2022-06-08 NOTE — Telephone Encounter (Signed)
New prescription sent to patient pharmacy on file

## 2022-06-08 NOTE — Telephone Encounter (Signed)
Patient called requesting a refill on her medication FARXIGA 5 MG TABS tablet. Patient stated she wants her refill to be sent to her pharmacy on file  [facility] ([place]), [place] - [address]  . Best callback number for patient is [phone].

## 2022-06-10 ENCOUNTER — Ambulatory Visit: Payer: BC Managed Care – PPO | Admitting: Emergency Medicine

## 2022-06-10 ENCOUNTER — Encounter: Payer: Self-pay | Admitting: Emergency Medicine

## 2022-06-10 VITALS — BP 124/72 | HR 76 | Temp 98.5°F | Ht 65.0 in | Wt 215.0 lb

## 2022-06-10 DIAGNOSIS — E1159 Type 2 diabetes mellitus with other circulatory complications: Secondary | ICD-10-CM

## 2022-06-10 DIAGNOSIS — I152 Hypertension secondary to endocrine disorders: Secondary | ICD-10-CM

## 2022-06-10 DIAGNOSIS — E785 Hyperlipidemia, unspecified: Secondary | ICD-10-CM

## 2022-06-10 DIAGNOSIS — E1169 Type 2 diabetes mellitus with other specified complication: Secondary | ICD-10-CM | POA: Diagnosis not present

## 2022-06-10 LAB — POCT GLYCOSYLATED HEMOGLOBIN (HGB A1C): Hemoglobin A1C: 9.6 % — AB (ref 4.0–5.6)

## 2022-06-10 NOTE — Progress Notes (Signed)
Jackie Wells 52 y.o.   Chief Complaint  Patient presents with   Follow-up    HISTORY OF PRESENT ILLNESS: This is a 52 y.o. female here for 61-monthfollow-up of diabetes and hypertension.  Overall doing well.  Has no complaints or medical concerns today.  HPI   Prior to Admission medications   Medication Sig Start Date End Date Taking? Authorizing Provider  atorvastatin (LIPITOR) 40 MG tablet TAKE 1 TABLET BY MOUTH EVERY DAY 07/08/21  Yes Alizza Sacra, MInes Bloomer MD  blood glucose meter kit and supplies KIT Per insurance preference. Check blood glucose once a day. E11.9 03/14/19  Yes SJacelyn Pi ILilia Argue MD  dapagliflozin propanediol (FARXIGA) 10 MG TABS tablet Take 1 tablet (10 mg total) by mouth daily before breakfast. 06/08/22  Yes Va Broadwell, MInes Bloomer MD  Dulaglutide (TRULICITY) 1.5 MOE/7.0JJSOPN Inject 1.5 mg into the skin once a week. 04/08/22  Yes Rendi Mapel, MInes Bloomer MD  lisinopril-hydrochlorothiazide (ZESTORETIC) 10-12.5 MG tablet TAKE 1 TABLET BY MOUTH EVERY DAY 07/08/21  Yes SHorald Pollen MD    No Known Allergies  Patient Active Problem List   Diagnosis Date Noted   Adenomatous polyp of colon 05/05/2022   Internal hemorrhoids 05/05/2022   Hordeolum externum of left lower eyelid 03/11/2022   Bilateral leg cramps 10/28/2021   Pelvic pain 06/19/2021   Varicose veins of left lower extremity with complications [id number]  Diabetes type 2, uncontrolled 11/09/2014   Morbid obesity ([place] 08/04/2014   Dyslipidemia associated with type 2 diabetes mellitus ([place] 08/04/2014   Hypertension associated with diabetes ([facility] 08/04/2014    Past Medical History:  Diagnosis Date   Anemia    Diabetes mellitus without complication ([place]    on meds   GERD (gastroesophageal reflux disease)    with certain foods   Hyperlipidemia    Hypertension    PONV (postoperative nausea and vomiting)     Past Surgical History:  Procedure Laterality Date   CESAREAN  SECTION  1994   2008   L[place]COPIC UNILATERAL [place]   Social History   Socioeconomic History   Marital status: Single    Spouse name: Not on file   Number of children: Not on file   Years of education: Not on file   Highest education level: Not on file  Occupational History   Not on file  Tobacco Use   Smoking status: Never   Smokeless tobacco: Never  Vaping Use   Vaping Use: Never used  Substance and Sexual Activity   Alcohol use: No    Alcohol/week: 0.0 standard drinks of alcohol   Drug use: No   Sexual activity: Yes    Birth control/protection: Post-menopausal  Other Topics Concern   Not on file  Social History Narrative   Not on file   Social Determinants of Health   Financial Resource Strain: Not on file  Food Insecurity: Not on file  Transportation Needs: Not on file  Physical Activity: Not on file  Stress: Not on file  Social Connections: Not on file  Intimate Partner Violence: Not on file    Family History  Problem Relation Age of Onset   Colon polyps Mother    Hyperlipidemia Mother    Hypertension Mother    Hypertension Father    Colon cancer Neg Hx    Esophageal cancer Neg Hx    Rectal cancer Neg Hx    Stomach cancer Neg Hx  Review of Systems  Constitutional: Negative.  Negative for chills and fever.  HENT: Negative.  Negative for congestion and sore throat.   Respiratory: Negative.  Negative for cough and shortness of breath.   Cardiovascular: Negative.  Negative for chest pain and palpitations.  Gastrointestinal: Negative.  Negative for abdominal pain, diarrhea, nausea and vomiting.  Genitourinary: Negative.   Skin: Negative.  Negative for rash.  Neurological: Negative.  Negative for dizziness and headaches.  All other systems reviewed and are negative.   Wt Readings from Last 3 Encounters:  06/10/22 215 lb (97.5 kg)  05/05/22 213 lb 6 oz (96.8 kg)  04/17/22 215 lb (97.5 kg)     Physical Exam Vitals reviewed.  Constitutional:      Appearance: Normal appearance.  HENT:     Head: Normocephalic.  Eyes:     Extraocular Movements: Extraocular movements intact.  Cardiovascular:     Rate and Rhythm: Normal rate and regular rhythm.     Pulses: Normal pulses.     Heart sounds: Normal heart sounds.  Pulmonary:     Effort: Pulmonary effort is normal.     Breath sounds: Normal breath sounds.  Musculoskeletal:     Cervical back: No tenderness.  Lymphadenopathy:     Cervical: No cervical adenopathy.  Skin:    General: Skin is warm and dry.  Neurological:     General: No focal deficit present.     Mental Status: She is alert and oriented to person, place, and time.  Psychiatric:        Mood and Affect: Mood normal.        Behavior: Behavior normal.    Results for orders placed or performed in visit on 06/10/22 (from the past 24 hour(s))  POCT glycosylated hemoglobin (Hb A1C)     Status: Abnormal   Collection Time: 06/10/22  9:56 AM  Result Value Ref Range   Hemoglobin A1C 9.6 (A) 4.0 - 5.6 %   HbA1c POC (<> result, manual entry)     HbA1c, POC (prediabetic range)     HbA1c, POC (controlled diabetic range)       ASSESSMENT & PLAN: A total of 48 minutes was spent with the patient and counseling/coordination of care regarding preparing for this visit, review of most recent office visit notes, review of multiple chronic medical conditions and their management, review of all medications, review of most recent blood work results including interpretation of today's hemoglobin A1c, cardiovascular risks associated with hypertension and diabetes, education on nutrition, prognosis, documentation, and need for follow-up in 3 months.  Problem List Items Addressed This Visit       Cardiovascular and Mediastinum   Hypertension associated with diabetes ([place]) - Primary    Well-controlled hypertension. Continue Zestoretic 10-12.5 mg daily Diabetes slowly improving  with hemoglobin A1c better than before at 9.6. Continue weekly Trulicity 1.5 mg.  Just started Farxiga 10 mg yesterday Diet and nutrition discussed Cardiovascular risks associated with hypertension and diabetes discussed Continue atorvastatin 40 mg daily Follow-up in 3 months.      Relevant Orders   POCT glycosylated hemoglobin (Hb A1C) (Completed)     Endocrine   Dyslipidemia associated with type 2 diabetes mellitus ([place])    Stable.  Diet and nutrition discussed. Continue atorvastatin 40 mg daily. The 10-year ASCVD risk score (Arnett DK, et al., 2019) is: 2.8%   Values used to calculate the score:     Age: 62 years     Sex: Female  Is Non-Hispanic African American: No     Diabetic: Yes     Tobacco smoker: No     Systolic Blood Pressure: 024 mmHg     Is BP treated: Yes     HDL Cholesterol: 39.7 mg/dL     Total Cholesterol: 130 mg/dL         Other   Morbid obesity (HCC)    Diet and nutrition discussed. Advised to decrease amount of daily carbohydrate intake and daily calories and increase amount of plant based protein in her diet.      Patient Instructions  Diabetes mellitus y nutricin, en adultos Diabetes Mellitus and Nutrition, Adult Si sufre de diabetes, o diabetes mellitus, es muy importante tener hbitos alimenticios saludables debido a que sus niveles de Designer, television/film set sangre (glucosa) se ven afectados en gran medida por lo que come y bebe. Comer alimentos saludables en las cantidades correctas, aproximadamente a la misma hora todos los [place], [place] ayudar a: Chief Technology Officer su glucemia. Disminuir el riesgo de sufrir una enfermedad cardaca. Mejorar la presin arterial. Science writer o mantener un peso saludable. Qu puede afectar mi plan de alimentacin? Todas las personas que sufren de diabetes son diferentes y cada una tiene necesidades diferentes en cuanto a un plan de alimentacin. El mdico puede recomendarle que trabaje con un nutricionista para elaborar el mejor plan  para usted. Su plan de alimentacin puede variar segn factores como: Las caloras que necesita. Los medicamentos que toma. Su peso. Sus niveles de glucemia, presin arterial y colesterol. Su nivel de [place]. Otras afecciones que tenga, como enfermedades cardacas o renales. Cmo me afectan los carbohidratos? Los carbohidratos, o hidratos de carbono, afectan su nivel de glucemia ms que cualquier otro tipo de alimento. La ingesta de carbohidratos aumenta la cantidad de [employer]. Es importante conocer la cantidad de carbohidratos que se pueden ingerir en cada comida sin correr Engineer, manufacturing. Esto es Psychologist, forensic. Su nutricionista puede ayudarlo a calcular la cantidad de carbohidratos que debe ingerir en cada comida y en cada refrigerio. Cmo me afecta el alcohol? El alcohol puede provocar una disminucin de la glucemia (hipoglucemia), especialmente si [place] insulina o toma determinados medicamentos por va oral para la diabetes. La hipoglucemia es una afeccin potencialmente mortal. Los sntomas de la hipoglucemia, como somnolencia, mareos y confusin, son similares a los sntomas de haber consumido demasiado alcohol. No beba alcohol si: Su mdico le indica no hacerlo. Est embarazada, puede estar embarazada o est tratando de [place]. Si bebe alcohol: Limite la cantidad que bebe a lo siguiente: De 0 a 1 medida por da para las mujeres. De 0 a 2 medidas por da para los hombres. Sepa cunta cantidad de alcohol hay en las bebidas que toma. En los [place], una medida equivale a una botella de cerveza de 12 oz (355 ml), un vaso de vino de 5 oz (148 ml) o un vaso de una bebida alcohlica de alta graduacin de 1 oz (44 ml). Mantngase hidratado bebiendo agua, refrescos dietticos o t helado sin azcar. Tenga en cuenta que los refrescos comunes, los jugos y otras bebidas para mezclar pueden contener Freight forwarder y se deben contar como  carbohidratos. Consejos para seguir Company secretary las etiquetas de los alimentos Comience por leer el tamao de la porcin en la etiqueta de Informacin nutricional de los alimentos envasados y las bebidas. La cantidad de caloras, carbohidratos, grasas y otros nutrientes detallados en la etiqueta se basan en una porcin  del alimento. Muchos alimentos contienen ms de una porcin por envase. Verifique la cantidad total de gramos (g) de carbohidratos totales en una porcin. Verifique la cantidad de gramos de grasas saturadas y grasas trans en una porcin. Escoja alimentos que no contengan estas grasas o que su contenido de estas sea [place]. Verifique la cantidad de miligramos (mg) de sal (sodio) en una porcin. La State [employer] de las personas deben limitar la ingesta de sodio total a menos de 2300 mg Honeywell. Siempre consulte la informacin nutricional de los alimentos etiquetados como "con bajo contenido de grasa" o "sin grasa". Estos alimentos pueden tener un mayor contenido de Location manager agregada o carbohidratos refinados, y deben evitarse. Hable con su nutricionista para identificar sus objetivos diarios en cuanto a los nutrientes mencionados en la etiqueta. Al ir de compras Evite comprar alimentos procesados, enlatados o precocidos. Estos alimentos tienden a Special educational needs teacher mayor cantidad de [place], sodio y azcar agregada. Compre en la zona exterior de la tienda de comestibles. Esta es la zona donde se encuentran con mayor frecuencia las frutas y las verduras frescas, los cereales a granel, las carnes frescas y los productos lcteos frescos. Al cocinar Use mtodos de coccin a baja temperatura, como hornear, en lugar de mtodos de coccin a alta temperatura, como frer en abundante aceite. Cocine con aceites saludables, como el aceite de [place], canola o [place]. Evite cocinar con manteca, crema o carnes con alto contenido de grasa. Planificacin de las comidas Coma las comidas y los refrigerios regularmente,  preferentemente a la misma hora todos [place]. Evite pasar largos perodos de tiempo sin comer. Consuma alimentos ricos en fibra, como frutas frescas, verduras, frijoles y cereales integrales. Consuma entre 4 y 6 onzas (entre 112 y 168 g) de protenas magras por da, como carnes [place], pollo, pescado, huevos o tofu. Una onza (oz) (28 g) de protena magra equivale a: 1 onza (28 g) de carne, pollo o pescado. 1 huevo.  taza (62 g) de tofu. Coma algunos alimentos por da que contengan grasas saludables, como aguacates, frutos secos, semillas y pescado. Qu alimentos debo comer? Lambert Mody Bayas. Manzanas. Naranjas. Duraznos. Damascos. Ciruelas. Uvas. Mangos. Papayas. Granadas. Kiwi. Cerezas. Verduras Verduras de Boeing, que incluyen [place], [place], col rizada, acelga, hojas de berza, hojas de mostaza y repollo. Remolachas. Coliflor. Brcoli. Zanahorias. Judas verdes. Tomates. Pimientos. Cebollas. Pepinos. Coles de Bruselas. Granos Granos integrales, como panes, galletas, tortillas, cereales y pastas de salvado o integrales. Avena sin azcar. Quinua. Arroz integral o salvaje. Carnes y otras protenas Frutos de mar. Carne de ave sin piel. Cortes magros de ave y carne de res. Tofu. Frutos secos. Semillas. Lcteos Productos lcteos sin grasa o con bajo contenido de [place], [place], yogur y [place]. Es posible que los productos detallados arriba no constituyan una lista completa de los alimentos y las bebidas que puede tomar. Consulte a un nutricionista para obtener ms informacin. Qu alimentos debo evitar? Lambert Mody Frutas enlatadas al almbar. Verduras Verduras enlatadas. Verduras congeladas con mantequilla o salsa de crema. Granos Productos elaborados con [place] y [place], como panes, pastas, bocadillos y cereales. Evite todos los alimentos procesados. Carnes y otras protenas Cortes de carne con alto contenido de Lobbyist. Carne de ave con piel. Carnes empanizadas o fritas.  Carne procesada. Evite las grasas saturadas. Lcteos [place]ur, [place] enteros. Bebidas Bebidas azucaradas, como gaseosas o t helado. Es posible que los productos que se enumeran ms [place] no constituyan una lista completa de los alimentos y las bebidas  que Nurse, adult. Consulte a un nutricionista para obtener ms informacin. Preguntas para hacerle al mdico Debo consultar con un especialista certificado en atencin y educacin sobre la diabetes? Es necesario que me rena con un nutricionista? A qu nmero puedo llamar si tengo preguntas? Cules son los mejores momentos para controlar la glucemia? Dnde encontrar ms informacin: American Diabetes Association (Asociacin Estadounidense de la Diabetes): diabetes.org Academy of Nutrition and Dietetics (Academia de Nutricin y Information systems manager): eatright.Unisys Corporation of Diabetes and Digestive and Kidney Diseases (Iota la Diabetes y [place] y [place]ales): AmenCredit.is Association of Diabetes Care & Education Specialists (Asociacin de Especialistas en Atencin y Educacin sobre la Diabetes): diabeteseducator.org Resumen Es importante tener hbitos alimenticios saludables debido a que sus niveles de Designer, television/film set sangre (glucosa) se ven afectados en gran medida por lo que come y bebe. Es importante consumir alcohol con prudencia. Un plan de comidas saludable lo ayudar a controlar la glucosa en sangre y a reducir el riesgo de enfermedades cardacas. El mdico puede recomendarle que trabaje con un nutricionista para elaborar el mejor plan para usted. Esta informacin no tiene Marine scientist el consejo del mdico. Asegrese de hacerle al mdico cualquier pregunta que tenga. Document Revised: 01/24/2020 Document Reviewed: 01/24/2020 Elsevier Patient Education  [place], MD [facility]

## 2022-06-10 NOTE — Assessment & Plan Note (Signed)
Stable.  Diet and nutrition discussed. Continue atorvastatin 40 mg daily. The 10-year ASCVD risk score (Arnett DK, et al., 2019) is: 2.8%   Values used to calculate the score:     Age: 52 years     Sex: Female     Is Non-Hispanic African American: No     Diabetic: Yes     Tobacco smoker: No     Systolic Blood Pressure: 709 mmHg     Is BP treated: Yes     HDL Cholesterol: 39.7 mg/dL     Total Cholesterol: 130 mg/dL

## 2022-06-10 NOTE — Assessment & Plan Note (Signed)
Diet and nutrition discussed.  Advised to decrease amount of daily carbohydrate intake and daily calories and increase amount of plant-based protein in her diet. 

## 2022-06-10 NOTE — Assessment & Plan Note (Signed)
Well-controlled hypertension. Continue Zestoretic 10-12.5 mg daily Diabetes slowly improving with hemoglobin A1c better than before at 9.6. Continue weekly Trulicity 1.5 mg.  Just started Farxiga 10 mg yesterday Diet and nutrition discussed Cardiovascular risks associated with hypertension and diabetes discussed Continue atorvastatin 40 mg daily Follow-up in 3 months.

## 2022-06-10 NOTE — Patient Instructions (Signed)
Diabetes mellitus y nutricin, en adultos Diabetes Mellitus and Nutrition, Adult Si sufre de diabetes, o diabetes mellitus, es muy importante tener hbitos alimenticios saludables debido a que sus niveles de azcar en la sangre (glucosa) se ven afectados en gran medida por lo que come y bebe. [place] alimentos saludables en las cantidades correctas, aproximadamente a la misma hora todos los das, lo ayudar a: Controlar su glucemia. Disminuir el riesgo de sufrir una enfermedad cardaca. Mejorar la presin arterial. Alcanzar o mantener un peso saludable. Qu puede afectar mi plan de alimentacin? Todas las personas que sufren de diabetes son diferentes y cada una tiene necesidades diferentes en cuanto a un plan de alimentacin. El mdico puede recomendarle que trabaje con un nutricionista para elaborar el mejor plan para usted. Su plan de alimentacin puede variar segn factores como: Las caloras que necesita. Los medicamentos que toma. Su peso. Sus niveles de glucemia, presin arterial y colesterol. Su nivel de actividad. Otras afecciones que tenga, como enfermedades cardacas o renales. Cmo me afectan los carbohidratos? Los carbohidratos, o hidratos de carbono, afectan su nivel de glucemia ms que cualquier otro tipo de alimento. La ingesta de carbohidratos aumenta la cantidad de glucosa en la sangre. Es importante conocer la cantidad de carbohidratos que se pueden ingerir en cada comida sin correr ningn riesgo. Esto es diferente en cada persona. Su nutricionista puede ayudarlo a calcular la cantidad de carbohidratos que debe ingerir en cada comida y en cada refrigerio. Cmo me afecta el alcohol? El alcohol puede provocar una disminucin de la glucemia (hipoglucemia), especialmente si usa insulina o toma determinados medicamentos por va oral para la diabetes. La hipoglucemia es una afeccin potencialmente mortal. Los sntomas de la hipoglucemia, como somnolencia, mareos y confusin, son  similares a los sntomas de haber consumido demasiado alcohol. No beba alcohol si: Su mdico le indica no hacerlo. Est embarazada, puede estar embarazada o est tratando de quedar embarazada. Si bebe alcohol: Limite la cantidad que bebe a lo siguiente: De 0 a 1 medida por da para las mujeres. De 0 a 2 medidas por da para los hombres. Sepa cunta cantidad de alcohol hay en las bebidas que toma. En los [place], una medida equivale a una botella de cerveza de 12 oz (355 ml), un vaso de vino de 5 oz (148 ml) o un vaso de una bebida alcohlica de alta graduacin de 1 oz (44 ml). Mantngase hidratado bebiendo agua, refrescos dietticos o t helado sin azcar. Tenga en cuenta que los refrescos comunes, los jugos y otras bebidas para mezclar pueden contener mucha azcar y se deben contar como carbohidratos. Consejos para seguir este plan  Leer las etiquetas de los alimentos Comience por leer el tamao de la porcin en la etiqueta de Informacin nutricional de los alimentos envasados y las bebidas. La cantidad de caloras, carbohidratos, grasas y otros nutrientes detallados en la etiqueta se basan en una porcin del alimento. Muchos alimentos contienen ms de una porcin por envase. Verifique la cantidad total de gramos (g) de carbohidratos totales en una porcin. Verifique la cantidad de gramos de grasas saturadas y grasas trans en una porcin. Escoja alimentos que no contengan estas grasas o que su contenido de estas sea bajo. Verifique la cantidad de miligramos (mg) de sal (sodio) en una porcin. La mayora de las personas deben limitar la ingesta de sodio total a menos de 2300 mg por da. Siempre consulte la informacin nutricional de los alimentos etiquetados como "con bajo contenido de grasa" o "sin grasa".   Estos alimentos pueden tener un mayor contenido de azcar agregada o carbohidratos refinados, y deben evitarse. Hable con su nutricionista para identificar sus objetivos diarios en  cuanto a los nutrientes mencionados en la etiqueta. Al ir de compras Evite comprar alimentos procesados, enlatados o precocidos. Estos alimentos tienden a tener una mayor cantidad de grasa, sodio y azcar agregada. Compre en la zona exterior de la tienda de comestibles. Esta es la zona donde se encuentran con mayor frecuencia las frutas y las verduras frescas, los cereales a granel, las carnes frescas y los productos lcteos frescos. Al cocinar Use mtodos de coccin a baja temperatura, como hornear, en lugar de mtodos de coccin a alta temperatura, como frer en abundante aceite. Cocine con aceites saludables, como el aceite de ol[place], can[place] o girasol. Evite cocinar con manteca, crema o carnes con alto contenido de grasa. Planificacin de las comidas Coma las comidas y los refrigerios regularmente, preferentemente a la misma hora todos los das. Evite pasar largos perodos de tiempo sin comer. Consuma alimentos ricos en fibra, como frutas frescas, verduras, frijoles y cereales integrales. Consuma entre 4 y 6 onzas (entre 112 y 168 g) de protenas magras por da, como carnes magras, pollo, pescado, huevos o tofu. Una onza (oz) (28 g) de protena magra equivale a: 1 onza (28 g) de carne, pollo o pescado. 1 huevo.  taza (62 g) de tofu. Coma algunos alimentos por da que contengan grasas saludables, como aguacates, frutos secos, semillas y pescado. Qu alimentos debo comer? Frutas Bayas. Manzanas. Naranjas. Duraznos. Damascos. Ciruelas. Uvas. Mangos. Papayas. Granadas. Kiwi. Cerezas. Verduras Verduras de hoja verde, que incluyen lechuga, espinaca, col rizada, acelga, hojas de berza, hojas de mostaza y repollo. Remolachas. Coliflor. Brcoli. Zanahorias. Judas verdes. Tomates. Pimientos. Cebollas. Pepinos. Coles de Bruselas. Granos Granos integrales, como panes, galletas, tortillas, cereales y pastas de salvado o integrales. Avena sin azcar. Quinua. Arroz integral o salvaje. Carnes y otras  protenas Frutos de mar. Carne de ave sin piel. Cortes magros de ave y carne de res. Tofu. Frutos secos. Semillas. Lcteos Productos lcteos sin grasa o con bajo contenido de grasa, como leche, yogur y queso. Es posible que los productos detallados arriba no constituyan una lista completa de los alimentos y las bebidas que puede tomar. Consulte a un nutricionista para obtener ms informacin. Qu alimentos debo evitar? Frutas Frutas enlatadas al almbar. Verduras Verduras enlatadas. Verduras congeladas con mantequilla o salsa de crema. Granos Productos elaborados con harina y harina blanca refinada, como panes, pastas, bocadillos y cereales. Evite todos los alimentos procesados. Carnes y otras protenas Cortes de carne con alto contenido de grasa. Carne de ave con piel. Carnes empanizadas o fritas. Carne procesada. Evite las grasas saturadas. Lcteos Yogur, queso o leche enteros. Bebidas Bebidas azucaradas, como gaseosas o t helado. Es posible que los productos que se enumeran ms arriba no constituyan una lista completa de los alimentos y las bebidas que debe evitar. Consulte a un nutricionista para obtener ms informacin. Preguntas para hacerle al mdico Debo consultar con un especialista certificado en atencin y educacin sobre la diabetes? Es necesario que me rena con un nutricionista? A qu nmero puedo llamar si tengo preguntas? Cules son los mejores momentos para controlar la glucemia? Dnde encontrar ms informacin: American Diabetes Association (Asociacin Estadounidense de la Diabetes): diabetes.org Academy of Nutrition and Dietetics (Academia de Nutricin y Diettica): eatright.org National Institute of Diabetes and Digestive and Kidney Diseases (Instituto Nacional de la Diabetes y las Enfermedades Digestivas y Renales): niddk.nih.gov Association of Diabetes   Care & Education Specialists (Asociacin de Especialistas en Atencin y Educacin sobre la Diabetes):  diabeteseducator.org Resumen Es importante tener hbitos alimenticios saludables debido a que sus niveles de azcar en la sangre (glucosa) se ven afectados en gran medida por lo que come y bebe. Es importante consumir alcohol con prudencia. Un plan de comidas saludable lo ayudar a controlar la glucosa en sangre y a reducir el riesgo de enfermedades cardacas. El mdico puede recomendarle que trabaje con un nutricionista para elaborar el mejor plan para usted. Esta informacin no tiene como fin reemplazar el consejo del mdico. Asegrese de hacerle al mdico cualquier pregunta que tenga. Document Revised: 01/24/2020 Document Reviewed: 01/24/2020 Elsevier Patient Education  2023 Elsevier Inc.  

## 2022-06-12 ENCOUNTER — Encounter: Payer: BC Managed Care – PPO | Admitting: Gastroenterology

## 2022-07-01 ENCOUNTER — Other Ambulatory Visit: Payer: Self-pay | Admitting: Emergency Medicine

## 2022-07-01 DIAGNOSIS — I10 Essential (primary) hypertension: Secondary | ICD-10-CM

## 2022-07-01 DIAGNOSIS — E782 Mixed hyperlipidemia: Secondary | ICD-10-CM

## 2022-07-04 ENCOUNTER — Other Ambulatory Visit: Payer: Self-pay | Admitting: Emergency Medicine

## 2022-08-10 ENCOUNTER — Telehealth: Payer: Self-pay | Admitting: Emergency Medicine

## 2022-08-10 NOTE — Telephone Encounter (Signed)
Prescription Request  08/10/2022  LOV: 06/10/2022  What is the name of the medication or equipment? Trulicity  Have you contacted your pharmacy to request a refill? Yes   Which pharmacy would you like this sent to?  [facility] [address] Patient notified that their request is being sent to the clinical staff for review and that they should receive a response within 2 business days.   Please advise at Mobile [phone] (mobile)

## 2022-08-11 MED ORDER — TRULICITY 1.5 MG/0.5ML ~~LOC~~ SOAJ: 1.5000 mg | SUBCUTANEOUS | 1 refills | Status: DC

## 2022-08-11 NOTE — Telephone Encounter (Signed)
New rx sent to patient pharmacy  

## 2022-09-09 ENCOUNTER — Encounter: Payer: Self-pay | Admitting: Emergency Medicine

## 2022-09-09 ENCOUNTER — Ambulatory Visit: Payer: BC Managed Care – PPO | Admitting: Emergency Medicine

## 2022-09-09 VITALS — BP 126/88 | HR 82 | Temp 97.9°F | Ht 65.0 in | Wt 220.4 lb

## 2022-09-09 DIAGNOSIS — E785 Hyperlipidemia, unspecified: Secondary | ICD-10-CM

## 2022-09-09 DIAGNOSIS — E1169 Type 2 diabetes mellitus with other specified complication: Secondary | ICD-10-CM

## 2022-09-09 DIAGNOSIS — I152 Hypertension secondary to endocrine disorders: Secondary | ICD-10-CM | POA: Diagnosis not present

## 2022-09-09 DIAGNOSIS — E1159 Type 2 diabetes mellitus with other circulatory complications: Secondary | ICD-10-CM

## 2022-09-09 DIAGNOSIS — Z23 Encounter for immunization: Secondary | ICD-10-CM

## 2022-09-09 LAB — POCT GLYCOSYLATED HEMOGLOBIN (HGB A1C): Hemoglobin A1C: 11.6 % — AB (ref 4.0–5.6)

## 2022-09-09 LAB — COMPREHENSIVE METABOLIC PANEL
ALT: 18 U/L (ref 0–35)
AST: 16 U/L (ref 0–37)
Albumin: 4 g/dL (ref 3.5–5.2)
Alkaline Phosphatase: 126 U/L — ABNORMAL HIGH (ref 39–117)
BUN: 27 mg/dL — ABNORMAL HIGH (ref 6–23)
CO2: 24 mEq/L (ref 19–32)
Calcium: 9.1 mg/dL (ref 8.4–10.5)
Chloride: 102 mEq/L (ref 96–112)
Creatinine, Ser: 1.17 mg/dL (ref 0.40–1.20)
GFR: 53.89 mL/min — ABNORMAL LOW (ref >60.00)
Glucose, Bld: 452 mg/dL — ABNORMAL HIGH (ref 70–99)
Potassium: 4.4 mEq/L (ref 3.5–5.1)
Sodium: 134 mEq/L — ABNORMAL LOW (ref 135–145)
Total Bilirubin: 0.3 mg/dL (ref 0.2–1.2)
Total Protein: 7.2 g/dL (ref 6.0–8.3)

## 2022-09-09 LAB — LIPID PANEL
Cholesterol: 130 mg/dL (ref 0–200)
HDL: 36.1 mg/dL — ABNORMAL LOW (ref >39.00)
NonHDL: 93.71
Total CHOL/HDL Ratio: 4
Triglycerides: 250 mg/dL — ABNORMAL HIGH (ref 0.0–149.0)
VLDL: 50 mg/dL — ABNORMAL HIGH (ref 0.0–40.0)

## 2022-09-09 LAB — CBC WITH DIFFERENTIAL/PLATELET
Basophils Absolute: 0 10*3/uL (ref 0.0–0.1)
Basophils Relative: 0.5 % (ref 0.0–3.0)
Eosinophils Absolute: 0.2 10*3/uL (ref 0.0–0.7)
Eosinophils Relative: 2.1 % (ref 0.0–5.0)
HCT: 35.5 % — ABNORMAL LOW (ref 36.0–46.0)
Hemoglobin: 11.7 g/dL — ABNORMAL LOW (ref 12.0–15.0)
Lymphocytes Relative: 20.8 % (ref 12.0–46.0)
Lymphs Abs: 1.6 10*3/uL (ref 0.7–4.0)
MCHC: 33 g/dL (ref 30.0–36.0)
MCV: 87.5 fl (ref 78.0–100.0)
Monocytes Absolute: 0.5 10*3/uL (ref 0.1–1.0)
Monocytes Relative: 5.9 % (ref 3.0–12.0)
Neutro Abs: 5.5 10*3/uL (ref 1.4–7.7)
Neutrophils Relative %: 70.7 % (ref 43.0–77.0)
Platelets: 247 10*3/uL (ref 150.0–400.0)
RBC: 4.06 Mil/uL (ref 3.87–5.11)
RDW: 13.4 % (ref 11.5–15.5)
WBC: 7.8 10*3/uL (ref 4.0–10.5)

## 2022-09-09 LAB — LDL CHOLESTEROL, DIRECT: Direct LDL: 67 mg/dL

## 2022-09-09 MED ORDER — METFORMIN HCL 500 MG PO TABS: 500.0000 mg | ORAL_TABLET | Freq: Two times a day (BID) | ORAL | 3 refills | Status: DC

## 2022-09-09 NOTE — Assessment & Plan Note (Signed)
Chronic stable condition Diet and nutrition discussed Lipid profile done today Continue atorvastatin 40 mg daily The 10-year ASCVD risk score (Arnett DK, et al., 2019) is: 2.9%   Values used to calculate the score:     Age: 52 years     Sex: Female     Is Non-Hispanic African American: No     Diabetic: Yes     Tobacco smoker: No     Systolic Blood Pressure: 126 mmHg     Is BP treated: Yes     HDL Cholesterol: 39.7 mg/dL     Total Cholesterol: 130 mg/dL

## 2022-09-09 NOTE — Assessment & Plan Note (Signed)
Well-controlled hypertension Continue Zestoretic 10-12.5 mg daily Uncontrolled diabetes with hemoglobin A1c higher than before at 11.6. Has been off Trulicity for 4 weeks but was finally able to get it last week Has been off for the next 3 months Continue 1.5 mg of Trulicity weekly Recommend to start metformin 500 mg twice a day and continue Farxiga 10 mg daily Cardiovascular risks associated with uncontrolled diabetes discussed Diet and nutrition discussed.  Advised to decrease amount of daily carbohydrate intake and daily calories and increase amount of plant-based protein in her diet. Patient has seen nutritionist in the past.  Does not want to see them again.  Understands that she has to improve her nutrition. Benefits of exercise discussed. Blood work done today. Follow-up in 3 months.

## 2022-09-09 NOTE — Progress Notes (Signed)
Jackie Wells 52 y.o.   Chief Complaint  Patient presents with   Medical Management of Chronic Issues    f/u appt, no concerns , patient states she been without Trulicity for more than a month     HISTORY OF PRESENT ILLNESS: This is a 52 y.o. female here for 73-month follow-up of hypertension, diabetes and dyslipidemia Unable to take Trulicity for 1 month due to shortage.  Was finally able to get it last week.  Now has enough for the next 3 months Not eating the way she should.  Not exercising enough. Has seen nutritionist in the past.  Not interested in following up. No other complaints or medical concerns today. Lab Results  Component Value Date   HGBA1C 9.6 (A) 06/10/2022   Wt Readings from Last 3 Encounters:  09/09/22 220 lb 6 oz (100 kg)  06/10/22 215 lb (97.5 kg)  05/05/22 213 lb 6 oz (96.8 kg)   BP Readings from Last 3 Encounters:  06/10/22 124/72  05/05/22 132/80  04/17/22 (!) 100/50     HPI   Prior to Admission medications   Medication Sig Start Date End Date Taking? Authorizing Provider  atorvastatin (LIPITOR) 40 MG tablet TAKE 1 TABLET BY MOUTH EVERY DAY 07/01/22  Yes Ronnae Kaser, Eilleen Kempf, MD  blood glucose meter kit and supplies KIT Per insurance preference. Check blood glucose once a day. E11.9 03/14/19  Yes Lezlie Lye, Meda Coffee, MD  dapagliflozin propanediol (FARXIGA) 10 MG TABS tablet Take 1 tablet (10 mg total) by mouth daily before breakfast. 06/08/22  Yes Aryn Kops, Eilleen Kempf, MD  lisinopril-hydrochlorothiazide (ZESTORETIC) 10-12.5 MG tablet TAKE 1 TABLET BY MOUTH EVERY DAY 07/01/22  Yes Chinonso Linker, Eilleen Kempf, MD  Dulaglutide (TRULICITY) 1.5 MG/0.5ML SOPN Inject 1.5 mg into the skin once a week. Patient not taking: Reported on 09/09/2022 08/11/22   Georgina Quint, MD    No Known Allergies  Patient Active Problem List   Diagnosis Date Noted   Adenomatous polyp of colon 05/05/2022   Internal hemorrhoids 05/05/2022   Hordeolum  externum of left lower eyelid 03/11/2022   Pelvic pain 06/19/2021   Varicose veins of left lower extremity with complications 08/13/2015   Diabetes type 2, uncontrolled 11/09/2014   Morbid obesity 08/04/2014   Dyslipidemia associated with type 2 diabetes mellitus 08/04/2014   Hypertension associated with diabetes 08/04/2014    Past Medical History:  Diagnosis Date   Anemia    Diabetes mellitus without complication (HCC)    on meds   GERD (gastroesophageal reflux disease)    with certain foods   Hyperlipidemia    Hypertension    PONV (postoperative nausea and vomiting)     Past Surgical History:  Procedure Laterality Date   CESAREAN SECTION  1994   2008   LAPAROSCOPIC UNILATERAL SALPINGO OOPHERECTOMY  1993   TONSILLECTOMY  1984    Social History   Socioeconomic History   Marital status: Single    Spouse name: Not on file   Number of children: Not on file   Years of education: Not on file   Highest education level: Not on file  Occupational History   Not on file  Tobacco Use   Smoking status: Never   Smokeless tobacco: Never  Vaping Use   Vaping Use: Never used  Substance and Sexual Activity   Alcohol use: No    Alcohol/week: 0.0 standard drinks of alcohol   Drug use: No   Sexual activity: Yes    Birth control/protection:  Post-menopausal  Other Topics Concern   Not on file  Social History Narrative   Not on file   Social Determinants of Health   Financial Resource Strain: Not on file  Food Insecurity: Not on file  Transportation Needs: Not on file  Physical Activity: Not on file  Stress: Not on file  Social Connections: Not on file  Intimate Partner Violence: Not on file    Family History  Problem Relation Age of Onset   Colon polyps Mother    Hyperlipidemia Mother    Hypertension Mother    Hypertension Father    Colon cancer Neg Hx    Esophageal cancer Neg Hx    Rectal cancer Neg Hx    Stomach cancer Neg Hx      Review of Systems   Constitutional: Negative.  Negative for chills and fever.  HENT: Negative.  Negative for congestion and sore throat.   Respiratory: Negative.  Negative for cough and shortness of breath.   Cardiovascular: Negative.  Negative for chest pain and palpitations.  Gastrointestinal:  Negative for abdominal pain, nausea and vomiting.  Genitourinary: Negative.  Negative for dysuria and hematuria.  Skin: Negative.  Negative for rash.  Neurological: Negative.  Negative for dizziness and headaches.  All other systems reviewed and are negative.   Today's Vitals   09/09/22 0806  BP: 126/88  Pulse: 82  Temp: 97.9 F (36.6 C)  TempSrc: Oral  SpO2: 95%  Weight: 220 lb 6 oz (100 kg)  Height: 5\' 5"  (1.651 m)   Body mass index is 36.67 kg/m.   Physical Exam Vitals reviewed.  Constitutional:      Appearance: Normal appearance. She is obese.  Eyes:     Extraocular Movements: Extraocular movements intact.  Cardiovascular:     Rate and Rhythm: Normal rate.  Pulmonary:     Effort: Pulmonary effort is normal.  Skin:    General: Skin is warm and dry.  Neurological:     Mental Status: She is alert and oriented to person, place, and time.  Psychiatric:        Mood and Affect: Mood normal.        Behavior: Behavior normal.    Results for orders placed or performed in visit on 09/09/22 (from the past 24 hour(s))  POCT HgB A1C     Status: Abnormal   Collection Time: 09/09/22  8:38 AM  Result Value Ref Range   Hemoglobin A1C 11.6 (A) 4.0 - 5.6 %   HbA1c POC (<> result, manual entry)     HbA1c, POC (prediabetic range)     HbA1c, POC (controlled diabetic range)    Comprehensive metabolic panel     Status: Abnormal   Collection Time: 09/09/22  8:48 AM  Result Value Ref Range   Sodium 134 (L) 135 - 145 mEq/L   Potassium 4.4 3.5 - 5.1 mEq/L   Chloride 102 96 - 112 mEq/L   CO2 24 19 - 32 mEq/L   Glucose, Bld 452 (H) 70 - 99 mg/dL   BUN 27 (H) 6 - 23 mg/dL   Creatinine, Ser 9.811.17 0.40 - 1.20  mg/dL   Total Bilirubin 0.3 0.2 - 1.2 mg/dL   Alkaline Phosphatase 126 (H) 39 - 117 U/L   AST 16 0 - 37 U/L   ALT 18 0 - 35 U/L   Total Protein 7.2 6.0 - 8.3 g/dL   Albumin 4.0 3.5 - 5.2 g/dL   GFR 19.1453.89 (L) >78.29>60.00 mL/min   Calcium  9.1 8.4 - 10.5 mg/dL  CBC with Differential/Platelet     Status: Abnormal   Collection Time: 09/09/22  8:48 AM  Result Value Ref Range   WBC 7.8 4.0 - 10.5 K/uL   RBC 4.06 3.87 - 5.11 Mil/uL   Hemoglobin 11.7 (L) 12.0 - 15.0 g/dL   HCT 16.1 (L) 09.6 - 04.5 %   MCV 87.5 78.0 - 100.0 fl   MCHC 33.0 30.0 - 36.0 g/dL   RDW 40.9 81.1 - 91.4 %   Platelets 247.0 150.0 - 400.0 K/uL   Neutrophils Relative % 70.7 43.0 - 77.0 %   Lymphocytes Relative 20.8 12.0 - 46.0 %   Monocytes Relative 5.9 3.0 - 12.0 %   Eosinophils Relative 2.1 0.0 - 5.0 %   Basophils Relative 0.5 0.0 - 3.0 %   Neutro Abs 5.5 1.4 - 7.7 K/uL   Lymphs Abs 1.6 0.7 - 4.0 K/uL   Monocytes Absolute 0.5 0.1 - 1.0 K/uL   Eosinophils Absolute 0.2 0.0 - 0.7 K/uL   Basophils Absolute 0.0 0.0 - 0.1 K/uL  Lipid panel     Status: Abnormal   Collection Time: 09/09/22  8:48 AM  Result Value Ref Range   Cholesterol 130 0 - 200 mg/dL   Triglycerides 782.9 (H) 0.0 - 149.0 mg/dL   HDL 56.21 (L) >30.86 mg/dL   VLDL 57.8 (H) 0.0 - 46.9 mg/dL   Total CHOL/HDL Ratio 4    NonHDL 93.71   LDL cholesterol, direct     Status: None   Collection Time: 09/09/22  8:48 AM  Result Value Ref Range   Direct LDL 67.0 mg/dL     ASSESSMENT & PLAN: A total of 45 minutes was spent with the patient and counseling/coordination of care regarding preparing for this visit, review of most recent office visit notes, review of most recent blood work results including interpretation of today's hemoglobin A1c, review of chronic medical conditions under management, review of all medications and changes made, cardiovascular risks associated with uncontrolled diabetes, education on nutrition, prognosis, documentation, and need for  follow-up  Problem List Items Addressed This Visit       Cardiovascular and Mediastinum   Hypertension associated with diabetes - Primary    Well-controlled hypertension Continue Zestoretic 10-12.5 mg daily Uncontrolled diabetes with hemoglobin A1c higher than before at 11.6. Has been off Trulicity for 4 weeks but was finally able to get it last week Has been off for the next 3 months Continue 1.5 mg of Trulicity weekly Recommend to start metformin 500 mg twice a day and continue Farxiga 10 mg daily Cardiovascular risks associated with uncontrolled diabetes discussed Diet and nutrition discussed.  Advised to decrease amount of daily carbohydrate intake and daily calories and increase amount of plant-based protein in her diet. Patient has seen nutritionist in the past.  Does not want to see them again.  Understands that she has to improve her nutrition. Benefits of exercise discussed. Blood work done today. Follow-up in 3 months.      Relevant Medications   metFORMIN (GLUCOPHAGE) 500 MG tablet   Other Relevant Orders   POCT HgB A1C   Comprehensive metabolic panel   CBC with Differential/Platelet     Endocrine   Dyslipidemia associated with type 2 diabetes mellitus    Chronic stable condition Diet and nutrition discussed Lipid profile done today Continue atorvastatin 40 mg daily The 10-year ASCVD risk score (Arnett DK, et al., 2019) is: 2.9%   Values used to calculate the  score:     Age: 34 years     Sex: Female     Is Non-Hispanic African American: No     Diabetic: Yes     Tobacco smoker: No     Systolic Blood Pressure: 126 mmHg     Is BP treated: Yes     HDL Cholesterol: 39.7 mg/dL     Total Cholesterol: 130 mg/dL       Relevant Medications   metFORMIN (GLUCOPHAGE) 500 MG tablet   Other Relevant Orders   CBC with Differential/Platelet   Lipid panel   Other Visit Diagnoses     Need for vaccination       Relevant Orders   Zoster Recombinant (Shingrix )       Patient Instructions  Diabetes mellitus y nutricin, en adultos Diabetes Mellitus and Nutrition, Adult Si sufre de diabetes, o diabetes mellitus, es muy importante tener hbitos alimenticios saludables debido a que sus niveles de Psychologist, counselling sangre (glucosa) se ven afectados en gran medida por lo que come y bebe. Comer alimentos saludables en las cantidades correctas, aproximadamente a la misma hora todos los [place], [place] ayudar a: Chief Operating Officer su glucemia. Disminuir el riesgo de sufrir una enfermedad cardaca. Mejorar la presin arterial. Barista o mantener un peso saludable. Qu puede afectar mi plan de alimentacin? Todas las personas que sufren de diabetes son diferentes y cada una tiene necesidades diferentes en cuanto a un plan de alimentacin. El mdico puede recomendarle que trabaje con un nutricionista para elaborar el mejor plan para usted. Su plan de alimentacin puede variar segn factores como: Las caloras que necesita. Los medicamentos que toma. Su peso. Sus niveles de glucemia, presin arterial y colesterol. Su nivel de [place]. Otras afecciones que tenga, como enfermedades cardacas o renales. Cmo me afectan los carbohidratos? Los carbohidratos, o hidratos de carbono, afectan su nivel de glucemia ms que cualquier otro tipo de alimento. La ingesta de carbohidratos aumenta la cantidad de [employer]. Es importante conocer la cantidad de carbohidratos que se pueden ingerir en cada comida sin correr Surveyor, minerals. Esto es Government social research officer. Su nutricionista puede ayudarlo a calcular la cantidad de carbohidratos que debe ingerir en cada comida y en cada refrigerio. Cmo me afecta el alcohol? El alcohol puede provocar una disminucin de la glucemia (hipoglucemia), especialmente si [place] insulina o toma determinados medicamentos por va oral para la diabetes. La hipoglucemia es una afeccin potencialmente mortal. Los sntomas de la hipoglucemia, como somnolencia,  mareos y confusin, son similares a los sntomas de haber consumido demasiado alcohol. No beba alcohol si: Su mdico le indica no hacerlo. Est embarazada, puede estar embarazada o est tratando de [place]. Si bebe alcohol: Limite la cantidad que bebe a lo siguiente: De 0 a 1 medida por da para las mujeres. De 0 a 2 medidas por da para los hombres. Sepa cunta cantidad de alcohol hay en las bebidas que toma. En los [address], una medida equivale a una botella de cerveza de 12 oz (355 ml), un vaso de vino de 5 oz (148 ml) o un vaso de una bebida alcohlica de alta graduacin de 1 oz (44 ml). Mantngase hidratado bebiendo agua, refrescos dietticos o t helado sin azcar. Tenga en cuenta que los refrescos comunes, los jugos y otras bebidas para mezclar pueden contener Product/process development scientist y se deben contar como carbohidratos. Consejos para seguir Social worker las etiquetas de los alimentos Comience por leer el tamao de la porcin  en la etiqueta de Informacin nutricional de los alimentos envasados y las bebidas. La cantidad de caloras, carbohidratos, grasas y otros nutrientes detallados en la etiqueta se basan en una porcin del alimento. Muchos alimentos contienen ms de una porcin por envase. Verifique la cantidad total de gramos (g) de carbohidratos totales en una porcin. Verifique la cantidad de gramos de grasas saturadas y grasas trans en una porcin. Escoja alimentos que no contengan estas grasas o que su contenido de estas sea [place]. Verifique la cantidad de miligramos (mg) de sal (sodio) en una porcin. La [employer] de las personas deben limitar la ingesta de sodio total a menos de 2300 mg Google. Siempre consulte la informacin nutricional de los alimentos etiquetados como "con bajo contenido de grasa" o "sin grasa". Estos alimentos pueden tener un mayor contenido de International aid/development worker agregada o carbohidratos refinados, y deben evitarse. Hable con su nutricionista para identificar sus  objetivos diarios en cuanto a los nutrientes mencionados en la etiqueta. Al ir de compras Evite comprar alimentos procesados, enlatados o precocidos. Estos alimentos tienden a Counselling psychologist mayor cantidad de [place], sodio y azcar agregada. Compre en la zona exterior de la tienda de comestibles. Esta es la zona donde se encuentran con mayor frecuencia las frutas y las verduras frescas, los cereales a granel, las carnes frescas y los productos lcteos frescos. Al cocinar Use mtodos de coccin a baja temperatura, como hornear, en lugar de mtodos de coccin a alta temperatura, como frer en abundante aceite. Cocine con aceites saludables, como el aceite de [place], canola o [place]. Evite cocinar con manteca, crema o carnes con alto contenido de grasa. Planificacin de las comidas Coma las comidas y los refrigerios regularmente, preferentemente a la misma hora todos [place]. Evite pasar largos perodos de tiempo sin comer. Consuma alimentos ricos en fibra, como frutas frescas, verduras, frijoles y cereales integrales. Consuma entre 4 y 6 onzas (entre 112 y 168 g) de protenas magras por da, como carnes [employer], pollo, pescado, huevos o tofu. Una onza (oz) (28 g) de protena magra equivale a: 1 onza (28 g) de carne, pollo o pescado. 1 huevo.  taza (62 g) de tofu. Coma algunos alimentos por da que contengan grasas saludables, como aguacates, frutos secos, semillas y pescado. Qu alimentos debo comer? Nils Pyle Bayas. Manzanas. Naranjas. Duraznos. Damascos. Ciruelas. Uvas. Mangos. Papayas. Granadas. Kiwi. Cerezas. Verduras Verduras de [employer], que incluyen [place], [place], col rizada, acelga, hojas de berza, hojas de mostaza y repollo. Remolachas. Coliflor. Brcoli. Zanahorias. Judas verdes. Tomates. Pimientos. Cebollas. Pepinos. Coles de Bruselas. Granos Granos integrales, como panes, galletas, tortillas, cereales y pastas de salvado o integrales. Avena sin azcar. Quinua. Arroz integral o  salvaje. Carnes y otras protenas Frutos de mar. Carne de ave sin piel. Cortes magros de ave y carne de res. Tofu. Frutos secos. Semillas. Lcteos Productos lcteos sin grasa o con bajo contenido de [place], [place], yogur y [place]. Es posible que los productos detallados arriba no constituyan una lista completa de los alimentos y las bebidas que puede tomar. Consulte a un nutricionista para obtener ms informacin. Qu alimentos debo evitar? Nils Pyle Frutas enlatadas al almbar. Verduras Verduras enlatadas. Verduras congeladas con mantequilla o salsa de crema. Granos Productos elaborados con [place] y [place], como panes, pastas, bocadillos y cereales. Evite todos los alimentos procesados. Carnes y [address] de carne con alto contenido de Holiday representative. Carne de ave con piel. Carnes empanizadas o fritas. Carne procesada. Evite las grasas saturadas. Lcteos Yogur, [place] o  leche enteros. Bebidas Bebidas azucaradas, como gaseosas o t helado. Es posible que los productos que se enumeran ms [place] no constituyan una lista completa de los alimentos y las bebidas que Personnel officer. Consulte a un nutricionista para obtener ms informacin. Preguntas para hacerle al mdico Debo consultar con un especialista certificado en atencin y educacin sobre la diabetes? Es necesario que me rena con un nutricionista? A qu nmero puedo llamar si tengo preguntas? Cules son los mejores momentos para controlar la glucemia? Dnde encontrar ms informacin: American Diabetes Association (Asociacin Estadounidense de la Diabetes): diabetes.org Academy of Nutrition and Dietetics (Academia de Nutricin y Pension scheme manager): eatright.Dana Corporation of Diabetes and Digestive and Kidney Diseases [employer] de la Diabetes y las Enfermedades Digestivas y Renales): StageSync.si Association of Diabetes Care & Education Specialists (Asociacin de Especialistas en Atencin y Educacin  sobre la Diabetes): diabeteseducator.org Resumen Es importante tener hbitos alimenticios saludables debido a que sus niveles de Psychologist, counselling sangre (glucosa) se ven afectados en gran medida por lo que come y bebe. Es importante consumir alcohol con prudencia. Un plan de comidas saludable lo ayudar a controlar la glucosa en sangre y a reducir el riesgo de enfermedades cardacas. El mdico puede recomendarle que trabaje con un nutricionista para elaborar el mejor plan para usted. Esta informacin no tiene Theme park manager el consejo del mdico. Asegrese de hacerle al mdico cualquier pregunta que tenga. Document Revised: 01/24/2020 Document Reviewed: 01/24/2020 Elsevier Patient Education  2023 Elsevier Inc.     Edwina Barth, MD [facility]

## 2022-09-09 NOTE — Patient Instructions (Signed)
Diabetes mellitus y nutricin, en adultos Diabetes Mellitus and Nutrition, Adult Si sufre de diabetes, o diabetes mellitus, es muy importante tener hbitos alimenticios saludables debido a que sus niveles de azcar en la sangre (glucosa) se ven afectados en gran medida por lo que come y bebe. [place] alimentos saludables en las cantidades correctas, aproximadamente a la misma hora todos los das, lo ayudar a: Controlar su glucemia. Disminuir el riesgo de sufrir una enfermedad cardaca. Mejorar la presin arterial. Alcanzar o mantener un peso saludable. Qu puede afectar mi plan de alimentacin? Todas las personas que sufren de diabetes son diferentes y cada una tiene necesidades diferentes en cuanto a un plan de alimentacin. El mdico puede recomendarle que trabaje con un nutricionista para elaborar el mejor plan para usted. Su plan de alimentacin puede variar segn factores como: Las caloras que necesita. Los medicamentos que toma. Su peso. Sus niveles de glucemia, presin arterial y colesterol. Su nivel de actividad. Otras afecciones que tenga, como enfermedades cardacas o renales. Cmo me afectan los carbohidratos? Los carbohidratos, o hidratos de carbono, afectan su nivel de glucemia ms que cualquier otro tipo de alimento. La ingesta de carbohidratos aumenta la cantidad de glucosa en la sangre. Es importante conocer la cantidad de carbohidratos que se pueden ingerir en cada comida sin correr ningn riesgo. Esto es diferente en cada persona. Su nutricionista puede ayudarlo a calcular la cantidad de carbohidratos que debe ingerir en cada comida y en cada refrigerio. Cmo me afecta el alcohol? El alcohol puede provocar una disminucin de la glucemia (hipoglucemia), especialmente si usa insulina o toma determinados medicamentos por va oral para la diabetes. La hipoglucemia es una afeccin potencialmente mortal. Los sntomas de la hipoglucemia, como somnolencia, mareos y confusin, son  similares a los sntomas de haber consumido demasiado alcohol. No beba alcohol si: Su mdico le indica no hacerlo. Est embarazada, puede estar embarazada o est tratando de quedar embarazada. Si bebe alcohol: Limite la cantidad que bebe a lo siguiente: De 0 a 1 medida por da para las mujeres. De 0 a 2 medidas por da para los hombres. Sepa cunta cantidad de alcohol hay en las bebidas que toma. En los [place], una medida equivale a una botella de cerveza de 12 oz (355 ml), un vaso de vino de 5 oz (148 ml) o un vaso de una bebida alcohlica de alta graduacin de 1 oz (44 ml). Mantngase hidratado bebiendo agua, refrescos dietticos o t helado sin azcar. Tenga en cuenta que los refrescos comunes, los jugos y otras bebidas para mezclar pueden contener mucha azcar y se deben contar como carbohidratos. Consejos para seguir este plan  Leer las etiquetas de los alimentos Comience por leer el tamao de la porcin en la etiqueta de Informacin nutricional de los alimentos envasados y las bebidas. La cantidad de caloras, carbohidratos, grasas y otros nutrientes detallados en la etiqueta se basan en una porcin del alimento. Muchos alimentos contienen ms de una porcin por envase. Verifique la cantidad total de gramos (g) de carbohidratos totales en una porcin. Verifique la cantidad de gramos de grasas saturadas y grasas trans en una porcin. Escoja alimentos que no contengan estas grasas o que su contenido de estas sea bajo. Verifique la cantidad de miligramos (mg) de sal (sodio) en una porcin. La mayora de las personas deben limitar la ingesta de sodio total a menos de 2300 mg por da. Siempre consulte la informacin nutricional de los alimentos etiquetados como "con bajo contenido de grasa" o "sin grasa".   Estos alimentos pueden tener un mayor contenido de azcar agregada o carbohidratos refinados, y deben evitarse. Hable con su nutricionista para identificar sus objetivos diarios en  cuanto a los nutrientes mencionados en la etiqueta. Al ir de compras Evite comprar alimentos procesados, enlatados o precocidos. Estos alimentos tienden a tener una mayor cantidad de grasa, sodio y azcar agregada. Compre en la zona exterior de la tienda de comestibles. Esta es la zona donde se encuentran con mayor frecuencia las frutas y las verduras frescas, los cereales a granel, las carnes frescas y los productos lcteos frescos. Al cocinar Use mtodos de coccin a baja temperatura, como hornear, en lugar de mtodos de coccin a alta temperatura, como frer en abundante aceite. Cocine con aceites saludables, como el aceite de ol[place], can[place] o girasol. Evite cocinar con manteca, crema o carnes con alto contenido de grasa. Planificacin de las comidas Coma las comidas y los refrigerios regularmente, preferentemente a la misma hora todos los das. Evite pasar largos perodos de tiempo sin comer. Consuma alimentos ricos en fibra, como frutas frescas, verduras, frijoles y cereales integrales. Consuma entre 4 y 6 onzas (entre 112 y 168 g) de protenas magras por da, como carnes magras, pollo, pescado, huevos o tofu. Una onza (oz) (28 g) de protena magra equivale a: 1 onza (28 g) de carne, pollo o pescado. 1 huevo.  taza (62 g) de tofu. Coma algunos alimentos por da que contengan grasas saludables, como aguacates, frutos secos, semillas y pescado. Qu alimentos debo comer? Frutas Bayas. Manzanas. Naranjas. Duraznos. Damascos. Ciruelas. Uvas. Mangos. Papayas. Granadas. Kiwi. Cerezas. Verduras Verduras de hoja verde, que incluyen lechuga, espinaca, col rizada, acelga, hojas de berza, hojas de mostaza y repollo. Remolachas. Coliflor. Brcoli. Zanahorias. Judas verdes. Tomates. Pimientos. Cebollas. Pepinos. Coles de Bruselas. Granos Granos integrales, como panes, galletas, tortillas, cereales y pastas de salvado o integrales. Avena sin azcar. Quinua. Arroz integral o salvaje. Carnes y otras  protenas Frutos de mar. Carne de ave sin piel. Cortes magros de ave y carne de res. Tofu. Frutos secos. Semillas. Lcteos Productos lcteos sin grasa o con bajo contenido de grasa, como leche, yogur y queso. Es posible que los productos detallados arriba no constituyan una lista completa de los alimentos y las bebidas que puede tomar. Consulte a un nutricionista para obtener ms informacin. Qu alimentos debo evitar? Frutas Frutas enlatadas al almbar. Verduras Verduras enlatadas. Verduras congeladas con mantequilla o salsa de crema. Granos Productos elaborados con harina y harina blanca refinada, como panes, pastas, bocadillos y cereales. Evite todos los alimentos procesados. Carnes y otras protenas Cortes de carne con alto contenido de grasa. Carne de ave con piel. Carnes empanizadas o fritas. Carne procesada. Evite las grasas saturadas. Lcteos Yogur, queso o leche enteros. Bebidas Bebidas azucaradas, como gaseosas o t helado. Es posible que los productos que se enumeran ms arriba no constituyan una lista completa de los alimentos y las bebidas que debe evitar. Consulte a un nutricionista para obtener ms informacin. Preguntas para hacerle al mdico Debo consultar con un especialista certificado en atencin y educacin sobre la diabetes? Es necesario que me rena con un nutricionista? A qu nmero puedo llamar si tengo preguntas? Cules son los mejores momentos para controlar la glucemia? Dnde encontrar ms informacin: American Diabetes Association (Asociacin Estadounidense de la Diabetes): diabetes.org Academy of Nutrition and Dietetics (Academia de Nutricin y Diettica): eatright.org National Institute of Diabetes and Digestive and Kidney Diseases (Instituto Nacional de la Diabetes y las Enfermedades Digestivas y Renales): niddk.nih.gov Association of Diabetes   Care & Education Specialists (Asociacin de Especialistas en Atencin y Educacin sobre la Diabetes):  diabeteseducator.org Resumen Es importante tener hbitos alimenticios saludables debido a que sus niveles de azcar en la sangre (glucosa) se ven afectados en gran medida por lo que come y bebe. Es importante consumir alcohol con prudencia. Un plan de comidas saludable lo ayudar a controlar la glucosa en sangre y a reducir el riesgo de enfermedades cardacas. El mdico puede recomendarle que trabaje con un nutricionista para elaborar el mejor plan para usted. Esta informacin no tiene como fin reemplazar el consejo del mdico. Asegrese de hacerle al mdico cualquier pregunta que tenga. Document Revised: 01/24/2020 Document Reviewed: 01/24/2020 Elsevier Patient Education  2023 Elsevier Inc.  

## 2022-12-08 ENCOUNTER — Encounter: Payer: Self-pay | Admitting: Emergency Medicine

## 2022-12-08 ENCOUNTER — Ambulatory Visit: Payer: BC Managed Care – PPO | Admitting: Emergency Medicine

## 2022-12-08 VITALS — BP 130/74 | HR 80 | Temp 98.4°F | Ht 65.0 in | Wt 222.5 lb

## 2022-12-08 DIAGNOSIS — Z7984 Long term (current) use of oral hypoglycemic drugs: Secondary | ICD-10-CM

## 2022-12-08 DIAGNOSIS — E785 Hyperlipidemia, unspecified: Secondary | ICD-10-CM

## 2022-12-08 DIAGNOSIS — E1159 Type 2 diabetes mellitus with other circulatory complications: Secondary | ICD-10-CM | POA: Diagnosis not present

## 2022-12-08 DIAGNOSIS — Z7985 Long-term (current) use of injectable non-insulin antidiabetic drugs: Secondary | ICD-10-CM

## 2022-12-08 DIAGNOSIS — I152 Hypertension secondary to endocrine disorders: Secondary | ICD-10-CM

## 2022-12-08 DIAGNOSIS — E1169 Type 2 diabetes mellitus with other specified complication: Secondary | ICD-10-CM | POA: Diagnosis not present

## 2022-12-08 LAB — POCT GLYCOSYLATED HEMOGLOBIN (HGB A1C): Hemoglobin A1C: 8.9 % — AB (ref 4.0–5.6)

## 2022-12-08 LAB — MICROALBUMIN / CREATININE URINE RATIO
Creatinine,U: 47 mg/dL
Microalb Creat Ratio: 1.5 mg/g (ref 0.0–30.0)
Microalb, Ur: <0.7 mg/dL (ref 0.0–1.9)

## 2022-12-08 NOTE — Assessment & Plan Note (Signed)
Diet and nutrition discussed Benefits of exercise discussed Advised to decrease amount of daily carbohydrate intake and daily calories and increase amount of plant based protein in her diet. 

## 2022-12-08 NOTE — Patient Instructions (Signed)
Diabetes mellitus y nutricin, en adultos Diabetes Mellitus and Nutrition, Adult Si sufre de diabetes, o diabetes mellitus, es muy importante tener hbitos alimenticios saludables debido a que sus niveles de azcar en la sangre (glucosa) se ven afectados en gran medida por lo que come y bebe. Comer alimentos saludables en las cantidades correctas, aproximadamente a la misma hora todos los das, lo ayudar a: Controlar su glucemia. Disminuir el riesgo de sufrir una enfermedad cardaca. Mejorar la presin arterial. Alcanzar o mantener un peso saludable. Qu puede afectar mi plan de alimentacin? Todas las personas que sufren de diabetes son diferentes y cada una tiene necesidades diferentes en cuanto a un plan de alimentacin. El mdico puede recomendarle que trabaje con un nutricionista para elaborar el mejor plan para usted. Su plan de alimentacin puede variar segn factores como: Las caloras que necesita. Los medicamentos que toma. Su peso. Sus niveles de glucemia, presin arterial y colesterol. Su nivel de actividad. Otras afecciones que tenga, como enfermedades cardacas o renales. Cmo me afectan los carbohidratos? Los carbohidratos, o hidratos de carbono, afectan su nivel de glucemia ms que cualquier otro tipo de alimento. La ingesta de carbohidratos aumenta la cantidad de glucosa en la sangre. Es importante conocer la cantidad de carbohidratos que se pueden ingerir en cada comida sin correr ningn riesgo. Esto es diferente en cada persona. Su nutricionista puede ayudarlo a calcular la cantidad de carbohidratos que debe ingerir en cada comida y en cada refrigerio. Cmo me afecta el alcohol? El alcohol puede provocar una disminucin de la glucemia (hipoglucemia), especialmente si usa insulina o toma determinados medicamentos por va oral para la diabetes. La hipoglucemia es una afeccin potencialmente mortal. Los sntomas de la hipoglucemia, como somnolencia, mareos y confusin, son  similares a los sntomas de haber consumido demasiado alcohol. No beba alcohol si: Su mdico le indica no hacerlo. Est embarazada, puede estar embarazada o est tratando de quedar embarazada. Si bebe alcohol: Limite la cantidad que bebe a lo siguiente: De 0 a 1 medida por da para las mujeres. De 0 a 2 medidas por da para los hombres. Sepa cunta cantidad de alcohol hay en las bebidas que toma. En los [place], una medida equivale a una botella de cerveza de 12 oz (355 ml), un vaso de vino de 5 oz (148 ml) o un vaso de una bebida alcohlica de alta graduacin de 1 oz (44 ml). Mantngase hidratado bebiendo agua, refrescos dietticos o t helado sin azcar. Tenga en cuenta que los refrescos comunes, los jugos y otras bebidas para mezclar pueden contener mucha azcar y se deben contar como carbohidratos. Consejos para seguir este plan  Leer las etiquetas de los alimentos Comience por leer el tamao de la porcin en la etiqueta de Informacin nutricional de los alimentos envasados y las bebidas. La cantidad de caloras, carbohidratos, grasas y otros nutrientes detallados en la etiqueta se basan en una porcin del alimento. Muchos alimentos contienen ms de una porcin por envase. Verifique la cantidad total de gramos (g) de carbohidratos totales en una porcin. Verifique la cantidad de gramos de grasas saturadas y grasas trans en una porcin. Escoja alimentos que no contengan estas grasas o que su contenido de estas sea bajo. Verifique la cantidad de miligramos (mg) de sal (sodio) en una porcin. La mayora de las personas deben limitar la ingesta de sodio total a menos de 2300 mg por da. Siempre consulte la informacin nutricional de los alimentos etiquetados como "con bajo contenido de grasa" o "sin grasa".   Estos alimentos pueden tener un mayor contenido de azcar agregada o carbohidratos refinados, y deben evitarse. Hable con su nutricionista para identificar sus objetivos diarios en  cuanto a los nutrientes mencionados en la etiqueta. Al ir de compras Evite comprar alimentos procesados, enlatados o precocidos. Estos alimentos tienden a tener una mayor cantidad de grasa, sodio y azcar agregada. Compre en la zona exterior de la tienda de comestibles. Esta es la zona donde se encuentran con mayor frecuencia las frutas y las verduras frescas, los cereales a granel, las carnes frescas y los productos lcteos frescos. Al cocinar Use mtodos de coccin a baja temperatura, como hornear, en lugar de mtodos de coccin a alta temperatura, como frer en abundante aceite. Cocine con aceites saludables, como el aceite de oliva, canola o girasol. Evite cocinar con manteca, crema o carnes con alto contenido de grasa. Planificacin de las comidas Coma las comidas y los refrigerios regularmente, preferentemente a la misma hora todos los das. Evite pasar largos perodos de tiempo sin comer. Consuma alimentos ricos en fibra, como frutas frescas, verduras, frijoles y cereales integrales. Consuma entre 4 y 6 onzas (entre 112 y 168 g) de protenas magras por da, como carnes magras, pollo, pescado, huevos o tofu. Una onza (oz) (28 g) de protena magra equivale a: 1 onza (28 g) de carne, pollo o pescado. 1 huevo.  taza (62 g) de tofu. Coma algunos alimentos por da que contengan grasas saludables, como aguacates, frutos secos, semillas y pescado. Qu alimentos debo comer? Frutas Bayas. Manzanas. Naranjas. Duraznos. Damascos. Ciruelas. Uvas. Mangos. Papayas. Granadas. Kiwi. Cerezas. Verduras Verduras de hoja verde, que incluyen lechuga, espinaca, col rizada, acelga, hojas de berza, hojas de mostaza y repollo. Remolachas. Coliflor. Brcoli. Zanahorias. Judas verdes. Tomates. Pimientos. Cebollas. Pepinos. Coles de Bruselas. Granos Granos integrales, como panes, galletas, tortillas, cereales y pastas de salvado o integrales. Avena sin azcar. Quinua. Arroz integral o salvaje. Carnes y otras  protenas Frutos de mar. Carne de ave sin piel. Cortes magros de ave y carne de res. Tofu. Frutos secos. Semillas. Lcteos Productos lcteos sin grasa o con bajo contenido de grasa, como leche, yogur y queso. Es posible que los productos detallados arriba no constituyan una lista completa de los alimentos y las bebidas que puede tomar. Consulte a un nutricionista para obtener ms informacin. Qu alimentos debo evitar? Frutas Frutas enlatadas al almbar. Verduras Verduras enlatadas. Verduras congeladas con mantequilla o salsa de crema. Granos Productos elaborados con harina y harina blanca refinada, como panes, pastas, bocadillos y cereales. Evite todos los alimentos procesados. Carnes y otras protenas Cortes de carne con alto contenido de grasa. Carne de ave con piel. Carnes empanizadas o fritas. Carne procesada. Evite las grasas saturadas. Lcteos Yogur, queso o leche enteros. Bebidas Bebidas azucaradas, como gaseosas o t helado. Es posible que los productos que se enumeran ms arriba no constituyan una lista completa de los alimentos y las bebidas que debe evitar. Consulte a un nutricionista para obtener ms informacin. Preguntas para hacerle al mdico Debo consultar con un especialista certificado en atencin y educacin sobre la diabetes? Es necesario que me rena con un nutricionista? A qu nmero puedo llamar si tengo preguntas? Cules son los mejores momentos para controlar la glucemia? Dnde encontrar ms informacin: American Diabetes Association (Asociacin Estadounidense de la Diabetes): diabetes.org Academy of Nutrition and Dietetics (Academia de Nutricin y Diettica): eatright.org National Institute of Diabetes and Digestive and Kidney Diseases (Instituto Nacional de la Diabetes y las Enfermedades Digestivas y Renales): niddk.nih.gov Association of Diabetes   Care & Education Specialists (Asociacin de Especialistas en Atencin y Educacin sobre la Diabetes):  diabeteseducator.org Resumen Es importante tener hbitos alimenticios saludables debido a que sus niveles de azcar en la sangre (glucosa) se ven afectados en gran medida por lo que come y bebe. Es importante consumir alcohol con prudencia. Un plan de comidas saludable lo ayudar a controlar la glucosa en sangre y a reducir el riesgo de enfermedades cardacas. El mdico puede recomendarle que trabaje con un nutricionista para elaborar el mejor plan para usted. Esta informacin no tiene como fin reemplazar el consejo del mdico. Asegrese de hacerle al mdico cualquier pregunta que tenga. Document Revised: 01/24/2020 Document Reviewed: 01/24/2020 Elsevier Patient Education  2024 [employer] Inc.  

## 2022-12-08 NOTE — Assessment & Plan Note (Signed)
BP Readings from Last 3 Encounters:  12/08/22 130/74  09/09/22 126/88  06/10/22 124/72  Well-controlled hypertension. Continue Zestoretic 10-12.5 mg daily Hemoglobin A1c better than before at 8.9 but still above goal Cardiovascular risk associated with hypertension and diabetes discussed Diet and nutrition discussed Recommend to continue metformin 500 mg twice a day, Farxiga 10 mg daily and weekly Trulicity 1.5 mg

## 2022-12-08 NOTE — Assessment & Plan Note (Signed)
Chronic stable condition. Diet and nutrition discussed Continue atorvastatin 40 mg daily The 10-year ASCVD risk score (Arnett DK, et al., 2019) is: 3.8%   Values used to calculate the score:     Age: 52 years     Sex: Female     Is Non-Hispanic African American: No     Diabetic: Yes     Tobacco smoker: No     Systolic Blood Pressure: 130 mmHg     Is BP treated: Yes     HDL Cholesterol: 36.1 mg/dL     Total Cholesterol: 130 mg/dL

## 2022-12-08 NOTE — Progress Notes (Signed)
Jackie Wells 52 y.o.   Chief Complaint  Patient presents with   Medical Management of Chronic Issues    f/u appt, no concerns    HISTORY OF PRESENT ILLNESS: This is a 52 y.o. female here for 33-month follow-up of diabetes and hypertension Overall doing well.  Eating better. Has no complaints or medical concerns today. Lab Results  Component Value Date   HGBA1C 11.6 (A) 09/09/2022   Wt Readings from Last 3 Encounters:  12/08/22 222 lb 8 oz (100.9 kg)  09/09/22 220 lb 6 oz (100 kg)  06/10/22 215 lb (97.5 kg)     HPI   Prior to Admission medications   Medication Sig Start Date End Date Taking? Authorizing Provider  atorvastatin (LIPITOR) 40 MG tablet TAKE 1 TABLET BY MOUTH EVERY DAY 07/01/22  Yes Lorean Ekstrand, Eilleen Kempf, MD  blood glucose meter kit and supplies KIT Per insurance preference. Check blood glucose once a day. E11.9 03/14/19  Yes Lezlie Lye, Meda Coffee, MD  dapagliflozin propanediol (FARXIGA) 10 MG TABS tablet Take 1 tablet (10 mg total) by mouth daily before breakfast. 06/08/22  Yes Tymir Terral, Eilleen Kempf, MD  Dulaglutide (TRULICITY) 1.5 MG/0.5ML SOPN Inject 1.5 mg into the skin once a week. 08/11/22  Yes Georgina Quint, MD  lisinopril-hydrochlorothiazide (ZESTORETIC) 10-12.5 MG tablet TAKE 1 TABLET BY MOUTH EVERY DAY 07/01/22  Yes Georgina Quint, MD  metFORMIN (GLUCOPHAGE) 500 MG tablet Take 1 tablet (500 mg total) by mouth 2 (two) times daily with a meal. 09/09/22  Yes Culver Feighner, Eilleen Kempf, MD    No Known Allergies  Patient Active Problem List   Diagnosis Date Noted   Adenomatous polyp of colon 05/05/2022   Internal hemorrhoids 05/05/2022   Hordeolum externum of left lower eyelid 03/11/2022   Pelvic pain 06/19/2021   Varicose veins of left lower extremity with complications 08/13/2015   Diabetes type 2, uncontrolled 11/09/2014   Morbid obesity (HCC) 08/04/2014   Dyslipidemia associated with type 2 diabetes mellitus (HCC) 08/04/2014    Hypertension associated with diabetes (HCC) 08/04/2014    Past Medical History:  Diagnosis Date   Anemia    Diabetes mellitus without complication (HCC)    on meds   GERD (gastroesophageal reflux disease)    with certain foods   Hyperlipidemia    Hypertension    PONV (postoperative nausea and vomiting)     Past Surgical History:  Procedure Laterality Date   CESAREAN SECTION  1994   2008   LAPAROSCOPIC UNILATERAL SALPINGO OOPHERECTOMY  1993   TONSILLECTOMY  1984    Social History   Socioeconomic History   Marital status: Single    Spouse name: Not on file   Number of children: Not on file   Years of education: Not on file   Highest education level: Not on file  Occupational History   Not on file  Tobacco Use   Smoking status: Never   Smokeless tobacco: Never  Vaping Use   Vaping Use: Never used  Substance and Sexual Activity   Alcohol use: No    Alcohol/week: 0.0 standard drinks of alcohol   Drug use: No   Sexual activity: Yes    Birth control/protection: Post-menopausal  Other Topics Concern   Not on file  Social History Narrative   Not on file   Social Determinants of Health   Financial Resource Strain: Not on file  Food Insecurity: Not on file  Transportation Needs: Not on file  Physical Activity: Not on file  Stress: Not on file  Social Connections: Not on file  Intimate Partner Violence: Not on file    Family History  Problem Relation Age of Onset   Colon polyps Mother    Hyperlipidemia Mother    Hypertension Mother    Hypertension Father    Colon cancer Neg Hx    Esophageal cancer Neg Hx    Rectal cancer Neg Hx    Stomach cancer Neg Hx      Review of Systems  Constitutional: Negative.  Negative for chills and fever.  HENT: Negative.  Negative for congestion and sore throat.   Respiratory: Negative.  Negative for cough and shortness of breath.   Cardiovascular: Negative.  Negative for chest pain and palpitations.  Gastrointestinal:   Negative for abdominal pain, diarrhea, nausea and vomiting.  Genitourinary: Negative.  Negative for dysuria and hematuria.  Skin: Negative.  Negative for rash.  Neurological: Negative.  Negative for dizziness and headaches.  All other systems reviewed and are negative.   Vitals:   12/08/22 1003  BP: 130/74  Pulse: 80  Temp: 98.4 F (36.9 C)  SpO2: 96%    Physical Exam Vitals reviewed.  Constitutional:      Appearance: Normal appearance. She is obese.  HENT:     Head: Normocephalic.     Mouth/Throat:     Mouth: Mucous membranes are moist.     Pharynx: Oropharynx is clear.  Eyes:     Extraocular Movements: Extraocular movements intact.     Conjunctiva/sclera: Conjunctivae normal.     Pupils: Pupils are equal, round, and reactive to light.  Cardiovascular:     Rate and Rhythm: Normal rate and regular rhythm.     Pulses: Normal pulses.     Heart sounds: Normal heart sounds.  Pulmonary:     Effort: Pulmonary effort is normal.     Breath sounds: Normal breath sounds.  Musculoskeletal:     Cervical back: No tenderness.  Lymphadenopathy:     Cervical: No cervical adenopathy.  Skin:    General: Skin is warm and dry.     Capillary Refill: Capillary refill takes less than 2 seconds.  Neurological:     General: No focal deficit present.     Mental Status: She is alert and oriented to person, place, and time.  Psychiatric:        Mood and Affect: Mood normal.        Behavior: Behavior normal.    Results for orders placed or performed in visit on 12/08/22 (from the past 24 hour(s))  POCT glycosylated hemoglobin (Hb A1C)     Status: Abnormal   Collection Time: 12/08/22 10:18 AM  Result Value Ref Range   Hemoglobin A1C 8.9 (A) 4.0 - 5.6 %   HbA1c POC (<> result, manual entry)     HbA1c, POC (prediabetic range)     HbA1c, POC (controlled diabetic range)       ASSESSMENT & PLAN: A total of 44 minutes was spent with the patient and counseling/coordination of care regarding  preparing for this visit, review of most recent office visit notes, review of most recent blood work results including interpretation of today's hemoglobin A1c, review of multiple chronic medical conditions and their management, cardiovascular risks associated with hypertension and uncontrolled diabetes, education on nutrition, review of all medications, prognosis, documentation and need for follow-up. Problem List Items Addressed This Visit       Cardiovascular and Mediastinum   Hypertension associated with diabetes (HCC) - Primary  BP Readings from Last 3 Encounters:  12/08/22 130/74  09/09/22 126/88  06/10/22 124/72  Well-controlled hypertension. Continue Zestoretic 10-12.5 mg daily Hemoglobin A1c better than before at 8.9 but still above goal Cardiovascular risk associated with hypertension and diabetes discussed Diet and nutrition discussed Recommend to continue metformin 500 mg twice a day, Farxiga 10 mg daily and weekly Trulicity 1.5 mg       Relevant Orders   Urine Microalbumin w/creat. ratio   POCT glycosylated hemoglobin (Hb A1C) (Completed)     Endocrine   Dyslipidemia associated with type 2 diabetes mellitus (HCC)    Chronic stable condition. Diet and nutrition discussed Continue atorvastatin 40 mg daily The 10-year ASCVD risk score (Arnett DK, et al., 2019) is: 3.8%   Values used to calculate the score:     Age: 35 years     Sex: Female     Is Non-Hispanic African American: No     Diabetic: Yes     Tobacco smoker: No     Systolic Blood Pressure: 130 mmHg     Is BP treated: Yes     HDL Cholesterol: 36.1 mg/dL     Total Cholesterol: 130 mg/dL         Other   Morbid obesity (HCC)    Diet and nutrition discussed Benefits of exercise discussed Advised to decrease amount of daily carbohydrate intake and daily calories and increase amount of plant-based protein in her diet      Patient Instructions  Diabetes mellitus y nutricin, en adultos Diabetes  Mellitus and Nutrition, Adult Si sufre de diabetes, o diabetes mellitus, es muy importante tener hbitos alimenticios saludables debido a que sus niveles de Psychologist, counselling sangre (glucosa) se ven afectados en gran medida por lo que come y bebe. Comer alimentos saludables en las cantidades correctas, aproximadamente a la misma hora todos los [place], [place] ayudar a: Chief Operating Officer su glucemia. Disminuir el riesgo de sufrir una enfermedad cardaca. Mejorar la presin arterial. Barista o mantener un peso saludable. Qu puede afectar mi plan de alimentacin? Todas las personas que sufren de diabetes son diferentes y cada una tiene necesidades diferentes en cuanto a un plan de alimentacin. El mdico puede recomendarle que trabaje con un nutricionista para elaborar el mejor plan para usted. Su plan de alimentacin puede variar segn factores como: Las caloras que necesita. Los medicamentos que toma. Su peso. Sus niveles de glucemia, presin arterial y colesterol. Su nivel de [place]. Otras afecciones que tenga, como enfermedades cardacas o renales. Cmo me afectan los carbohidratos? Los carbohidratos, o hidratos de carbono, afectan su nivel de glucemia ms que cualquier otro tipo de alimento. La ingesta de carbohidratos aumenta la cantidad de [employer]. Es importante conocer la cantidad de carbohidratos que se pueden ingerir en cada comida sin correr Surveyor, minerals. Esto es Government social research officer. Su nutricionista puede ayudarlo a calcular la cantidad de carbohidratos que debe ingerir en cada comida y en cada refrigerio. Cmo me afecta el alcohol? El alcohol puede provocar una disminucin de la glucemia (hipoglucemia), especialmente si [place] insulina o toma determinados medicamentos por va oral para la diabetes. La hipoglucemia es una afeccin potencialmente mortal. Los sntomas de la hipoglucemia, como somnolencia, mareos y confusin, son similares a los sntomas de haber consumido demasiado  alcohol. No beba alcohol si: Su mdico le indica no hacerlo. Est embarazada, puede estar embarazada o est tratando de [place]. Si bebe alcohol: Limite la cantidad que bebe a lo siguiente: De 0  a 1 medida por da para las mujeres. De 0 a 2 medidas por da para los hombres. Sepa cunta cantidad de alcohol hay en las bebidas que toma. En los [address], una medida equivale a una botella de cerveza de 12 oz (355 ml), un vaso de vino de 5 oz (148 ml) o un vaso de una bebida alcohlica de alta graduacin de 1 oz (44 ml). Mantngase hidratado bebiendo agua, refrescos dietticos o t helado sin azcar. Tenga en cuenta que los refrescos comunes, los jugos y otras bebidas para mezclar pueden contener Product/process development scientist y se deben contar como carbohidratos. Consejos para seguir Social worker las etiquetas de los alimentos Comience por leer el tamao de la porcin en la etiqueta de Informacin nutricional de los alimentos envasados y las bebidas. La cantidad de caloras, carbohidratos, grasas y otros nutrientes detallados en la etiqueta se basan en una porcin del alimento. Muchos alimentos contienen ms de una porcin por envase. Verifique la cantidad total de gramos (g) de carbohidratos totales en una porcin. Verifique la cantidad de gramos de grasas saturadas y grasas trans en una porcin. Escoja alimentos que no contengan estas grasas o que su contenido de estas sea [place]. Verifique la cantidad de miligramos (mg) de sal (sodio) en una porcin. La [employer] de las personas deben limitar la ingesta de sodio total a menos de 2300 mg Google. Siempre consulte la informacin nutricional de los alimentos etiquetados como "con bajo contenido de grasa" o "sin grasa". Estos alimentos pueden tener un mayor contenido de International aid/development worker agregada o carbohidratos refinados, y deben evitarse. Hable con su nutricionista para identificar sus objetivos diarios en cuanto a los nutrientes mencionados en la etiqueta. Al ir  de compras Evite comprar alimentos procesados, enlatados o precocidos. Estos alimentos tienden a Counselling psychologist mayor cantidad de [place], sodio y azcar agregada. Compre en la zona exterior de la tienda de comestibles. Esta es la zona donde se encuentran con mayor frecuencia las frutas y las verduras frescas, los cereales a granel, las carnes frescas y los productos lcteos frescos. Al cocinar Use mtodos de coccin a baja temperatura, como hornear, en lugar de mtodos de coccin a alta temperatura, como frer en abundante aceite. Cocine con aceites saludables, como el aceite de [place], canola o [place]. Evite cocinar con manteca, crema o carnes con alto contenido de grasa. Planificacin de las comidas Coma las comidas y los refrigerios regularmente, preferentemente a la misma hora todos [place]. Evite pasar largos perodos de tiempo sin comer. Consuma alimentos ricos en fibra, como frutas frescas, verduras, frijoles y cereales integrales. Consuma entre 4 y 6 onzas (entre 112 y 168 g) de protenas magras por da, como carnes [place], pollo, pescado, huevos o tofu. Una onza (oz) (28 g) de protena magra equivale a: 1 onza (28 g) de carne, pollo o pescado. 1 huevo.  taza (62 g) de tofu. Coma algunos alimentos por da que contengan grasas saludables, como aguacates, frutos secos, semillas y pescado. Qu alimentos debo comer? Nils Pyle Bayas. Manzanas. Naranjas. Duraznos. Damascos. Ciruelas. Uvas. Mangos. Papayas. Granadas. Kiwi. Cerezas. Verduras Verduras de [employer], que incluyen [place], [place], col rizada, acelga, hojas de berza, hojas de mostaza y repollo. Remolachas. Coliflor. Brcoli. Zanahorias. Judas verdes. Tomates. Pimientos. Cebollas. Pepinos. Coles de Bruselas. Granos Granos integrales, como panes, galletas, tortillas, cereales y pastas de salvado o integrales. Avena sin azcar. Quinua. Arroz integral o salvaje. Carnes y otras protenas Frutos de mar. Carne de ave sin piel. [place] [employer]  de  ave y carne de res. Tofu. Frutos secos. Semillas. Lcteos Productos lcteos sin grasa o con bajo contenido de [place], [place], yogur y [employer]. Es posible que los productos detallados arriba no constituyan una lista completa de los alimentos y las bebidas que puede tomar. Consulte a un nutricionista para obtener ms informacin. Qu alimentos debo evitar? Nils Pyle Frutas enlatadas al almbar. Verduras Verduras enlatadas. Verduras congeladas con mantequilla o salsa de crema. Granos Productos elaborados con [place] y [place], como panes, pastas, bocadillos y cereales. Evite todos los alimentos procesados. Carnes y [address] de carne con alto contenido de Holiday representative. Carne de ave con piel. Carnes empanizadas o fritas. Carne procesada. Evite las grasas saturadas. Lcteos Yogur, queso o [employer] Health. Bebidas Bebidas azucaradas, como gaseosas o t helado. Es posible que los productos que se enumeran ms [place] no constituyan una lista completa de los alimentos y las bebidas que Personnel officer. Consulte a un nutricionista para obtener ms informacin. Preguntas para hacerle al mdico Debo consultar con un especialista certificado en atencin y educacin sobre la diabetes? Es necesario que me rena con un nutricionista? A qu nmero puedo llamar si tengo preguntas? Cules son los mejores momentos para controlar la glucemia? Dnde encontrar ms informacin: American Diabetes Association (Asociacin Estadounidense de la Diabetes): diabetes.org Academy of Nutrition and Dietetics (Academia de Nutricin y Pension scheme manager): eatright.Dana Corporation of Diabetes and Digestive and Kidney Diseases [employer]ere & Company de la Diabetes y las Enfermedades Digestivas y Renales): StageSync.si Association of Diabetes Care & Education Specialists (Asociacin de Especialistas en Atencin y Educacin sobre la Diabetes): diabeteseducator.org Resumen Es importante tener hbitos alimenticios  saludables debido a que sus niveles de Psychologist, counselling sangre (glucosa) se ven afectados en gran medida por lo que come y bebe. Es importante consumir alcohol con prudencia. Un plan de comidas saludable lo ayudar a controlar la glucosa en sangre y a reducir el riesgo de enfermedades cardacas. El mdico puede recomendarle que trabaje con un nutricionista para elaborar el mejor plan para usted. Esta informacin no tiene Theme park manager el consejo del mdico. Asegrese de hacerle al mdico cualquier pregunta que tenga. Document Revised: 01/24/2020 Document Reviewed: 01/24/2020 Elsevier Patient Education  2024 Elsevier Inc.     Edwina Barth, MD [facility]

## 2022-12-09 ENCOUNTER — Ambulatory Visit: Payer: BC Managed Care – PPO | Admitting: Emergency Medicine

## 2023-02-18 ENCOUNTER — Other Ambulatory Visit: Payer: Self-pay | Admitting: *Deleted

## 2023-02-18 ENCOUNTER — Telehealth: Payer: Self-pay | Admitting: Emergency Medicine

## 2023-02-18 MED ORDER — TRULICITY 1.5 MG/0.5ML ~~LOC~~ SOAJ: 1.5000 mg | SUBCUTANEOUS | 1 refills | Status: DC

## 2023-02-18 NOTE — Telephone Encounter (Signed)
Prescription Request  02/18/2023  LOV: 12/08/2022  What is the name of the medication or equipment?  Dulaglutide (TRULICITY) 1.5 MG/0.5ML SOPN    Have you contacted your pharmacy to request a refill? No   Which pharmacy would you like this sent to?     [id number] - [place], [place] - [address] AT [facility] [address] [place] [address] Phone: [phone] Fax: [phone]  Patient notified that their request is being sent to the clinical staff for review and that they should receive a response within 2 business days.   Please advise at Mobile [phone] (mobile)

## 2023-02-18 NOTE — Telephone Encounter (Signed)
Sent medication to patient pharmacy on file.

## 2023-03-11 ENCOUNTER — Ambulatory Visit: Payer: BC Managed Care – PPO | Admitting: Emergency Medicine

## 2023-03-11 ENCOUNTER — Encounter: Payer: Self-pay | Admitting: Emergency Medicine

## 2023-03-11 VITALS — BP 126/88 | HR 73 | Temp 98.0°F | Ht 65.0 in | Wt 221.5 lb

## 2023-03-11 DIAGNOSIS — E1159 Type 2 diabetes mellitus with other circulatory complications: Secondary | ICD-10-CM

## 2023-03-11 DIAGNOSIS — E1169 Type 2 diabetes mellitus with other specified complication: Secondary | ICD-10-CM

## 2023-03-11 DIAGNOSIS — Z23 Encounter for immunization: Secondary | ICD-10-CM | POA: Diagnosis not present

## 2023-03-11 DIAGNOSIS — E785 Hyperlipidemia, unspecified: Secondary | ICD-10-CM

## 2023-03-11 DIAGNOSIS — I152 Hypertension secondary to endocrine disorders: Secondary | ICD-10-CM

## 2023-03-11 DIAGNOSIS — Z7985 Long-term (current) use of injectable non-insulin antidiabetic drugs: Secondary | ICD-10-CM

## 2023-03-11 LAB — POCT GLYCOSYLATED HEMOGLOBIN (HGB A1C): Hemoglobin A1C: 8.9 % — AB (ref 4.0–5.6)

## 2023-03-11 MED ORDER — TRULICITY 3 MG/0.5ML ~~LOC~~ SOAJ
3.0000 mg | SUBCUTANEOUS | 5 refills | Status: DC
Start: 2023-03-11 — End: 2023-11-13

## 2023-03-11 NOTE — Assessment & Plan Note (Signed)
Cardiovascular risks associated with obesity discussed Diet and nutrition discussed Benefits of exercise discussed Advised to decrease amount of daily carbohydrate intake and daily calories and increase amount of plant-based protein in her diet We will follow-up in 3 months

## 2023-03-11 NOTE — Patient Instructions (Signed)
Diabetes mellitus y nutricin, en adultos Diabetes Mellitus and Nutrition, Adult Si sufre de diabetes, o diabetes mellitus, es muy importante tener hbitos alimenticios saludables debido a que sus niveles de azcar en la sangre (glucosa) se ven afectados en gran medida por lo que come y bebe. Comer alimentos saludables en las cantidades correctas, aproximadamente a la misma hora todos los das, lo ayudar a: Controlar su glucemia. Disminuir el riesgo de sufrir una enfermedad cardaca. Mejorar la presin arterial. Alcanzar o mantener un peso saludable. Qu puede afectar mi plan de alimentacin? Todas las personas que sufren de diabetes son diferentes y cada una tiene necesidades diferentes en cuanto a un plan de alimentacin. El mdico puede recomendarle que trabaje con un nutricionista para elaborar el mejor plan para usted. Su plan de alimentacin puede variar segn factores como: Las caloras que necesita. Los medicamentos que toma. Su peso. Sus niveles de glucemia, presin arterial y colesterol. Su nivel de actividad. Otras afecciones que tenga, como enfermedades cardacas o renales. Cmo me afectan los carbohidratos? Los carbohidratos, o hidratos de carbono, afectan su nivel de glucemia ms que cualquier otro tipo de alimento. La ingesta de carbohidratos aumenta la cantidad de glucosa en la sangre. Es importante conocer la cantidad de carbohidratos que se pueden ingerir en cada comida sin correr ningn riesgo. Esto es diferente en cada persona. Su nutricionista puede ayudarlo a calcular la cantidad de carbohidratos que debe ingerir en cada comida y en cada refrigerio. Cmo me afecta el alcohol? El alcohol puede provocar una disminucin de la glucemia (hipoglucemia), especialmente si usa insulina o toma determinados medicamentos por va oral para la diabetes. La hipoglucemia es una afeccin potencialmente mortal. Los sntomas de la hipoglucemia, como somnolencia, mareos y confusin, son  similares a los sntomas de haber consumido demasiado alcohol. No beba alcohol si: Su mdico le indica no hacerlo. Est embarazada, puede estar embarazada o est tratando de quedar embarazada. Si bebe alcohol: Limite la cantidad que bebe a lo siguiente: De 0 a 1 medida por da para las mujeres. De 0 a 2 medidas por da para los hombres. Sepa cunta cantidad de alcohol hay en las bebidas que toma. En los [place], una medida equivale a una botella de cerveza de 12 oz (355 ml), un vaso de vino de 5 oz (148 ml) o un vaso de una bebida alcohlica de alta graduacin de 1 oz (44 ml). Mantngase hidratado bebiendo agua, refrescos dietticos o t helado sin azcar. Tenga en cuenta que los refrescos comunes, los jugos y otras bebidas para mezclar pueden contener mucha azcar y se deben contar como carbohidratos. Consejos para seguir este plan  Leer las etiquetas de los alimentos Comience por leer el tamao de la porcin en la etiqueta de Informacin nutricional de los alimentos envasados y las bebidas. La cantidad de caloras, carbohidratos, grasas y otros nutrientes detallados en la etiqueta se basan en una porcin del alimento. Muchos alimentos contienen ms de una porcin por envase. Verifique la cantidad total de gramos (g) de carbohidratos totales en una porcin. Verifique la cantidad de gramos de grasas saturadas y grasas trans en una porcin. Escoja alimentos que no contengan estas grasas o que su contenido de estas sea bajo. Verifique la cantidad de miligramos (mg) de sal (sodio) en una porcin. La mayora de las personas deben limitar la ingesta de sodio total a menos de 2300 mg por da. Siempre consulte la informacin nutricional de los alimentos etiquetados como "con bajo contenido de grasa" o "sin grasa".   Estos alimentos pueden tener un mayor contenido de azcar agregada o carbohidratos refinados, y deben evitarse. Hable con su nutricionista para identificar sus objetivos diarios en  cuanto a los nutrientes mencionados en la etiqueta. Al ir de compras Evite comprar alimentos procesados, enlatados o precocidos. Estos alimentos tienden a tener una mayor cantidad de grasa, sodio y azcar agregada. Compre en la zona exterior de la tienda de comestibles. Esta es la zona donde se encuentran con mayor frecuencia las frutas y las verduras frescas, los cereales a granel, las carnes frescas y los productos lcteos frescos. Al cocinar Use mtodos de coccin a baja temperatura, como hornear, en lugar de mtodos de coccin a alta temperatura, como frer en abundante aceite. Cocine con aceites saludables, como el aceite de oliva, canola o girasol. Evite cocinar con manteca, crema o carnes con alto contenido de grasa. Planificacin de las comidas Coma las comidas y los refrigerios regularmente, preferentemente a la misma hora todos los das. Evite pasar largos perodos de tiempo sin comer. Consuma alimentos ricos en fibra, como frutas frescas, verduras, frijoles y cereales integrales. Consuma entre 4 y 6 onzas (entre 112 y 168 g) de protenas magras por da, como carnes magras, pollo, pescado, huevos o tofu. Una onza (oz) (28 g) de protena magra equivale a: 1 onza (28 g) de carne, pollo o pescado. 1 huevo.  taza (62 g) de tofu. Coma algunos alimentos por da que contengan grasas saludables, como aguacates, frutos secos, semillas y pescado. Qu alimentos debo comer? Frutas Bayas. Manzanas. Naranjas. Duraznos. Damascos. Ciruelas. Uvas. Mangos. Papayas. Granadas. Kiwi. Cerezas. Verduras Verduras de hoja verde, que incluyen lechuga, espinaca, col rizada, acelga, hojas de berza, hojas de mostaza y repollo. Remolachas. Coliflor. Brcoli. Zanahorias. Judas verdes. Tomates. Pimientos. Cebollas. Pepinos. Coles de Bruselas. Granos Granos integrales, como panes, galletas, tortillas, cereales y pastas de salvado o integrales. Avena sin azcar. Quinua. Arroz integral o salvaje. Carnes y otras  protenas Frutos de mar. Carne de ave sin piel. Cortes magros de ave y carne de res. Tofu. Frutos secos. Semillas. Lcteos Productos lcteos sin grasa o con bajo contenido de grasa, como leche, yogur y queso. Es posible que los productos detallados arriba no constituyan una lista completa de los alimentos y las bebidas que puede tomar. Consulte a un nutricionista para obtener ms informacin. Qu alimentos debo evitar? Frutas Frutas enlatadas al almbar. Verduras Verduras enlatadas. Verduras congeladas con mantequilla o salsa de crema. Granos Productos elaborados con harina y harina blanca refinada, como panes, pastas, bocadillos y cereales. Evite todos los alimentos procesados. Carnes y otras protenas Cortes de carne con alto contenido de grasa. Carne de ave con piel. Carnes empanizadas o fritas. Carne procesada. Evite las grasas saturadas. Lcteos Yogur, queso o leche enteros. Bebidas Bebidas azucaradas, como gaseosas o t helado. Es posible que los productos que se enumeran ms arriba no constituyan una lista completa de los alimentos y las bebidas que debe evitar. Consulte a un nutricionista para obtener ms informacin. Preguntas para hacerle al mdico Debo consultar con un especialista certificado en atencin y educacin sobre la diabetes? Es necesario que me rena con un nutricionista? A qu nmero puedo llamar si tengo preguntas? Cules son los mejores momentos para controlar la glucemia? Dnde encontrar ms informacin: American Diabetes Association (Asociacin Estadounidense de la Diabetes): diabetes.org Academy of Nutrition and Dietetics (Academia de Nutricin y Diettica): eatright.org National Institute of Diabetes and Digestive and Kidney Diseases (Instituto Nacional de la Diabetes y las Enfermedades Digestivas y Renales): niddk.nih.gov Association of Diabetes   Care & Education Specialists (Asociacin de Especialistas en Atencin y Educacin sobre la Diabetes):  diabeteseducator.org Resumen Es importante tener hbitos alimenticios saludables debido a que sus niveles de azcar en la sangre (glucosa) se ven afectados en gran medida por lo que come y bebe. Es importante consumir alcohol con prudencia. Un plan de comidas saludable lo ayudar a controlar la glucosa en sangre y a reducir el riesgo de enfermedades cardacas. El mdico puede recomendarle que trabaje con un nutricionista para elaborar el mejor plan para usted. Esta informacin no tiene como fin reemplazar el consejo del mdico. Asegrese de hacerle al mdico cualquier pregunta que tenga. Document Revised: 01/24/2020 Document Reviewed: 01/24/2020 Elsevier Patient Education  2024 [employer] Inc.  

## 2023-03-11 NOTE — Assessment & Plan Note (Signed)
Hemoglobin A1c still not at goal at 8.9 Diet and nutrition discussed We will continue metformin and Farxiga and increase dose of weekly Trulicity Continue atorvastatin 40 mg daily Follow-up in 3 months

## 2023-03-11 NOTE — Assessment & Plan Note (Signed)
Well-controlled hyper tension Continue Zestoretic 10-12.5 mg daily Hemoglobin A1c at 8.9, still not at goal Cardiovascular risks associated with uncontrolled diabetes discussed Diet and nutrition discussed Recommend to continue metformin 500 mg twice a day and Farxiga 10 mg daily Recommend to increase Trulicity dose to 3 mg weekly We will follow-up in 3 months

## 2023-03-11 NOTE — Progress Notes (Signed)
Jackie Wells 52 y.o.   Chief Complaint  Patient presents with   Medical Management of Chronic Issues    f/u appt, no concerns     HISTORY OF PRESENT ILLNESS: This is a 52 y.o. female here for 17-month follow-up of chronic medical conditions including hypertension and diabetes Lab Results  Component Value Date   HGBA1C 8.9 (A) 12/08/2022   Wt Readings from Last 3 Encounters:  03/11/23 221 lb 8 oz (100.5 kg)  12/08/22 222 lb 8 oz (100.9 kg)  09/09/22 220 lb 6 oz (100 kg)     HPI   Prior to Admission medications   Medication Sig Start Date End Date Taking? Authorizing Provider  atorvastatin (LIPITOR) 40 MG tablet TAKE 1 TABLET BY MOUTH EVERY DAY 07/01/22  Yes Yvetta Drotar, Eilleen Kempf, MD  blood glucose meter kit and supplies KIT Per insurance preference. Check blood glucose once a day. E11.9 03/14/19  Yes Lezlie Lye, Meda Coffee, MD  dapagliflozin propanediol (FARXIGA) 10 MG TABS tablet Take 1 tablet (10 mg total) by mouth daily before breakfast. 06/08/22  Yes Arbie Blankley, Eilleen Kempf, MD  Dulaglutide (TRULICITY) 1.5 MG/0.5ML SOPN Inject 1.5 mg into the skin once a week. 02/18/23  Yes Georgina Quint, MD  lisinopril-hydrochlorothiazide (ZESTORETIC) 10-12.5 MG tablet TAKE 1 TABLET BY MOUTH EVERY DAY 07/01/22  Yes Georgina Quint, MD  metFORMIN (GLUCOPHAGE) 500 MG tablet Take 1 tablet (500 mg total) by mouth 2 (two) times daily with a meal. 09/09/22  Yes Baran Kuhrt, Eilleen Kempf, MD    No Known Allergies  Patient Active Problem List   Diagnosis Date Noted   Adenomatous polyp of colon 05/05/2022   Internal hemorrhoids 05/05/2022   Hordeolum externum of left lower eyelid 03/11/2022   Pelvic pain 06/19/2021   Varicose veins of left lower extremity with complications 08/13/2015   Diabetes type 2, uncontrolled 11/09/2014   Morbid obesity (HCC) 08/04/2014   Dyslipidemia associated with type 2 diabetes mellitus (HCC) 08/04/2014   Hypertension associated with diabetes  (HCC) 08/04/2014    Past Medical History:  Diagnosis Date   Anemia    Diabetes mellitus without complication (HCC)    on meds   GERD (gastroesophageal reflux disease)    with certain foods   Hyperlipidemia    Hypertension    PONV (postoperative nausea and vomiting)     Past Surgical History:  Procedure Laterality Date   CESAREAN SECTION  1994   2008   LAPAROSCOPIC UNILATERAL SALPINGO OOPHERECTOMY  1993   TONSILLECTOMY  1984    Social History   Socioeconomic History   Marital status: Single    Spouse name: Not on file   Number of children: Not on file   Years of education: Not on file   Highest education level: Not on file  Occupational History   Not on file  Tobacco Use   Smoking status: Never   Smokeless tobacco: Never  Vaping Use   Vaping status: Never Used  Substance and Sexual Activity   Alcohol use: No    Alcohol/week: 0.0 standard drinks of alcohol   Drug use: No   Sexual activity: Yes    Birth control/protection: Post-menopausal  Other Topics Concern   Not on file  Social History Narrative   Not on file   Social Determinants of Health   Financial Resource Strain: Not on file  Food Insecurity: Not on file  Transportation Needs: Not on file  Physical Activity: Not on file  Stress: Not on file  Social Connections: Not on file  Intimate Partner Violence: Not on file    Family History  Problem Relation Age of Onset   Colon polyps Mother    Hyperlipidemia Mother    Hypertension Mother    Hypertension Father    Colon cancer Neg Hx    Esophageal cancer Neg Hx    Rectal cancer Neg Hx    Stomach cancer Neg Hx      Review of Systems  Constitutional: Negative.  Negative for chills and fever.  HENT: Negative.  Negative for congestion and sore throat.   Cardiovascular: Negative.  Negative for chest pain and palpitations.  Gastrointestinal:  Negative for abdominal pain, nausea and vomiting.  Genitourinary: Negative.  Negative for dysuria and  hematuria.  Skin: Negative.  Negative for rash.  Neurological: Negative.  Negative for dizziness and headaches.  All other systems reviewed and are negative.   Today's Vitals   03/11/23 0840  BP: 126/88  Pulse: 73  Temp: 98 F (36.7 C)  TempSrc: Oral  SpO2: 98%  Weight: 221 lb 8 oz (100.5 kg)  Height: 5\' 5"  (1.651 m)   Body mass index is 36.86 kg/m.   Physical Exam Vitals reviewed.  Constitutional:      Appearance: Normal appearance.  HENT:     Mouth/Throat:     Mouth: Mucous membranes are moist.     Pharynx: Oropharynx is clear.  Eyes:     Extraocular Movements: Extraocular movements intact.     Pupils: Pupils are equal, round, and reactive to light.  Cardiovascular:     Rate and Rhythm: Normal rate and regular rhythm.     Pulses: Normal pulses.     Heart sounds: Normal heart sounds.  Pulmonary:     Effort: Pulmonary effort is normal.     Breath sounds: Normal breath sounds.  Musculoskeletal:     Cervical back: No tenderness.  Lymphadenopathy:     Cervical: No cervical adenopathy.  Skin:    General: Skin is warm and dry.     Capillary Refill: Capillary refill takes less than 2 seconds.  Neurological:     General: No focal deficit present.     Mental Status: She is alert and oriented to person, place, and time.  Psychiatric:        Mood and Affect: Mood normal.        Behavior: Behavior normal.    Results for orders placed or performed in visit on 03/11/23 (from the past 24 hour(s))  POCT HgB A1C     Status: Abnormal   Collection Time: 03/11/23  9:16 AM  Result Value Ref Range   Hemoglobin A1C 8.9 (A) 4.0 - 5.6 %   HbA1c POC (<> result, manual entry)     HbA1c, POC (prediabetic range)     HbA1c, POC (controlled diabetic range)       ASSESSMENT & PLAN: A total of 45 minutes was spent with the patient and counseling/coordination of care regarding preparing for this visit, review of most recent office visit notes, review of multiple chronic medical  conditions under management, review of most recent blood work results including interpretation of today's hemoglobin A1c, review of all medications and changes made, cardiovascular risks associated with uncontrolled diabetes, education on nutrition, review of health maintenance items, prognosis, documentation, and need for follow-up.  Problem List Items Addressed This Visit       Cardiovascular and Mediastinum   Hypertension associated with diabetes (HCC) - Primary    Well-controlled hyper  tension Continue Zestoretic 10-12.5 mg daily Hemoglobin A1c at 8.9, still not at goal Cardiovascular risks associated with uncontrolled diabetes discussed Diet and nutrition discussed Recommend to continue metformin 500 mg twice a day and Farxiga 10 mg daily Recommend to increase Trulicity dose to 3 mg weekly We will follow-up in 3 months      Relevant Medications   Dulaglutide (TRULICITY) 3 MG/0.5ML SOPN     Endocrine   Dyslipidemia associated with type 2 diabetes mellitus (HCC)    Hemoglobin A1c still not at goal at 8.9 Diet and nutrition discussed We will continue metformin and Farxiga and increase dose of weekly Trulicity Continue atorvastatin 40 mg daily Follow-up in 3 months      Relevant Medications   Dulaglutide (TRULICITY) 3 MG/0.5ML SOPN     Other   Morbid obesity (HCC)    Cardiovascular risks associated with obesity discussed Diet and nutrition discussed Benefits of exercise discussed Advised to decrease amount of daily carbohydrate intake and daily calories and increase amount of plant-based protein in her diet We will follow-up in 3 months      Relevant Medications   Dulaglutide (TRULICITY) 3 MG/0.5ML SOPN   Other Visit Diagnoses     Need for vaccination       Relevant Orders   Flu vaccine trivalent PF, 6mos and older(Flulaval,Afluria,Fluarix,Fluzone)      Patient Instructions  Diabetes mellitus y nutricin, en adultos Diabetes Mellitus and Nutrition, Adult Si  sufre de diabetes, o diabetes mellitus, es muy importante tener hbitos alimenticios saludables debido a que sus niveles de Psychologist, counselling sangre (glucosa) se ven afectados en gran medida por lo que come y bebe. [place] alimentos saludables en las cantidades correctas, aproximadamente a la misma hora todos los [place], [place] ayudar a: Chief Operating Officer su glucemia. Disminuir el riesgo de sufrir una enfermedad cardaca. Mejorar la presin arterial. Barista o mantener un peso saludable. Qu puede afectar mi plan de alimentacin? Todas las personas que sufren de diabetes son diferentes y cada una tiene necesidades diferentes en cuanto a un plan de alimentacin. El mdico puede recomendarle que trabaje con un nutricionista para elaborar el mejor plan para usted. Su plan de alimentacin puede variar segn factores como: Las caloras que necesita. Los medicamentos que toma. Su peso. Sus niveles de glucemia, presin arterial y colesterol. Su nivel de [place]. Otras afecciones que tenga, como enfermedades cardacas o renales. Cmo me afectan los carbohidratos? Los carbohidratos, o hidratos de carbono, afectan su nivel de glucemia ms que cualquier otro tipo de alimento. La ingesta de carbohidratos aumenta la cantidad de [employer]. Es importante conocer la cantidad de carbohidratos que se pueden ingerir en cada comida sin correr Surveyor, minerals. Esto es Government social research officer. Su nutricionista puede ayudarlo a calcular la cantidad de carbohidratos que debe ingerir en cada comida y en cada refrigerio. Cmo me afecta el alcohol? El alcohol puede provocar una disminucin de la glucemia (hipoglucemia), especialmente si [place] insulina o toma determinados medicamentos por va oral para la diabetes. La hipoglucemia es una afeccin potencialmente mortal. Los sntomas de la hipoglucemia, como somnolencia, mareos y confusin, son similares a los sntomas de haber consumido demasiado alcohol. No beba alcohol si: Su  mdico le indica no hacerlo. Est embarazada, puede estar embarazada o est tratando de [place]. Si bebe alcohol: Limite la cantidad que bebe a lo siguiente: De 0 a 1 medida por da para las mujeres. De 0 a 2 medidas por da para los hombres. Sepa cunta  cantidad de alcohol hay en las bebidas que toma. En los [address], una medida equivale a una botella de cerveza de 12 oz (355 ml), un vaso de vino de 5 oz (148 ml) o un vaso de una bebida alcohlica de alta graduacin de 1 oz (44 ml). Mantngase hidratado bebiendo agua, refrescos dietticos o t helado sin azcar. Tenga en cuenta que los refrescos comunes, los jugos y otras bebidas para mezclar pueden contener Product/process development scientist y se deben contar como carbohidratos. Consejos para seguir Social worker las etiquetas de los alimentos Comience por leer el tamao de la porcin en la etiqueta de Informacin nutricional de los alimentos envasados y las bebidas. La cantidad de caloras, carbohidratos, grasas y otros nutrientes detallados en la etiqueta se basan en una porcin del alimento. Muchos alimentos contienen ms de una porcin por envase. Verifique la cantidad total de gramos (g) de carbohidratos totales en una porcin. Verifique la cantidad de gramos de grasas saturadas y grasas trans en una porcin. Escoja alimentos que no contengan estas grasas o que su contenido de estas sea [place]. Verifique la cantidad de miligramos (mg) de sal (sodio) en una porcin. La [employer] de las personas deben limitar la ingesta de sodio total a menos de 2300 mg Google. Siempre consulte la informacin nutricional de los alimentos etiquetados como "con bajo contenido de grasa" o "sin grasa". Estos alimentos pueden tener un mayor contenido de International aid/development worker agregada o carbohidratos refinados, y deben evitarse. Hable con su nutricionista para identificar sus objetivos diarios en cuanto a los nutrientes mencionados en la etiqueta. Al ir de compras Evite comprar  alimentos procesados, enlatados o precocidos. Estos alimentos tienden a Counselling psychologist mayor cantidad de [place], sodio y azcar agregada. Compre en la zona exterior de la tienda de comestibles. Esta es la zona donde se encuentran con mayor frecuencia las frutas y las verduras frescas, los cereales a granel, las carnes frescas y los productos lcteos frescos. Al cocinar Use mtodos de coccin a baja temperatura, como hornear, en lugar de mtodos de coccin a alta temperatura, como frer en abundante aceite. Cocine con aceites saludables, como el aceite de [place], canola o [place]. Evite cocinar con manteca, crema o carnes con alto contenido de grasa. Planificacin de las comidas Coma las comidas y los refrigerios regularmente, preferentemente a la misma hora todos [place]. Evite pasar largos perodos de tiempo sin comer. Consuma alimentos ricos en fibra, como frutas frescas, verduras, frijoles y cereales integrales. Consuma entre 4 y 6 onzas (entre 112 y 168 g) de protenas magras por da, como carnes [place], pollo, pescado, huevos o tofu. Una onza (oz) (28 g) de protena magra equivale a: 1 onza (28 g) de carne, pollo o pescado. 1 huevo.  taza (62 g) de tofu. Coma algunos alimentos por da que contengan grasas saludables, como aguacates, frutos secos, semillas y pescado. Qu alimentos debo comer? Nils Pyle Bayas. Manzanas. Naranjas. Duraznos. Damascos. Ciruelas. Uvas. Mangos. Papayas. Granadas. Kiwi. Cerezas. Verduras Verduras de [employer], que incluyen [place], [place], col rizada, acelga, hojas de berza, hojas de mostaza y repollo. Remolachas. Coliflor. Brcoli. Zanahorias. Judas verdes. Tomates. Pimientos. Cebollas. Pepinos. Coles de Bruselas. Granos Granos integrales, como panes, galletas, tortillas, cereales y pastas de salvado o integrales. Avena sin azcar. Quinua. Arroz integral o salvaje. Carnes y otras protenas Frutos de mar. Carne de ave sin piel. Cortes magros de ave y carne de res.  Tofu. Frutos secos. Semillas. Lcteos Productos lcteos sin grasa o con bajo contenido de [place],  como [place], yogur y [place]. Es posible que los productos detallados arriba no constituyan una lista completa de los alimentos y las bebidas que puede tomar. Consulte a un nutricionista para obtener ms informacin. Qu alimentos debo evitar? Nils Pyle Frutas enlatadas al almbar. Verduras Verduras enlatadas. Verduras congeladas con mantequilla o salsa de crema. Granos Productos elaborados con [place] y [place], como panes, pastas, bocadillos y cereales. Evite todos los alimentos procesados. Carnes y [address] de carne con alto contenido de Holiday representative. Carne de ave con piel. Carnes empanizadas o fritas. Carne procesada. Evite las grasas saturadas. Lcteos Yogur, queso o [employer]. Bebidas Bebidas azucaradas, como gaseosas o t helado. Es posible que los productos que se enumeran ms [place] no constituyan una lista completa de los alimentos y las bebidas que Personnel officer. Consulte a un nutricionista para obtener ms informacin. Preguntas para hacerle al mdico Debo consultar con un especialista certificado en atencin y educacin sobre la diabetes? Es necesario que me rena con un nutricionista? A qu nmero puedo llamar si tengo preguntas? Cules son los mejores momentos para controlar la glucemia? Dnde encontrar ms informacin: American Diabetes Association (Asociacin Estadounidense de la Diabetes): diabetes.org Academy of Nutrition and Dietetics (Academia de Nutricin y Pension scheme manager): eatright.D[employer] of Diabetes and Digestive and Kidney Diseases [employer] de la Diabetes y las Enfermedades Digestivas y Renales): StageSync.si Association of Diabetes Care & Education Specialists (Asociacin de Especialistas en Atencin y Educacin sobre la Diabetes): diabeteseducator.org Resumen Es importante tener hbitos alimenticios saludables debido a  que sus niveles de Psychologist, counselling sangre (glucosa) se ven afectados en gran medida por lo que come y bebe. Es importante consumir alcohol con prudencia. Un plan de comidas saludable lo ayudar a controlar la glucosa en sangre y a reducir el riesgo de enfermedades cardacas. El mdico puede recomendarle que trabaje con un nutricionista para elaborar el mejor plan para usted. Esta informacin no tiene Theme park manager el consejo del mdico. Asegrese de hacerle al mdico cualquier pregunta que tenga. Document Revised: 01/24/2020 Document Reviewed: 01/24/2020 Elsevier Patient Education  2024 Elsevier Inc.     Jackie Barth, MD [facility]

## 2023-03-23 ENCOUNTER — Other Ambulatory Visit: Payer: Self-pay | Admitting: Emergency Medicine

## 2023-03-23 DIAGNOSIS — Z1231 Encounter for screening mammogram for malignant neoplasm of breast: Secondary | ICD-10-CM

## 2023-04-23 ENCOUNTER — Ambulatory Visit
Admission: RE | Admit: 2023-04-23 | Discharge: 2023-04-23 | Disposition: A | Discharge status: Home / Self Care | Payer: BC Managed Care – PPO | Source: Ambulatory Visit | Attending: Emergency Medicine | Admitting: Emergency Medicine

## 2023-04-23 DIAGNOSIS — Z1231 Encounter for screening mammogram for malignant neoplasm of breast: Secondary | ICD-10-CM

## 2023-05-15 ENCOUNTER — Other Ambulatory Visit: Payer: Self-pay | Admitting: Emergency Medicine

## 2023-06-14 ENCOUNTER — Ambulatory Visit: Payer: Self-pay | Admitting: Emergency Medicine

## 2023-06-28 ENCOUNTER — Ambulatory Visit: Payer: BC Managed Care – PPO | Admitting: Emergency Medicine

## 2023-06-28 ENCOUNTER — Encounter: Payer: Self-pay | Admitting: Emergency Medicine

## 2023-06-28 VITALS — BP 118/78 | HR 98 | Temp 98.0°F | Ht 65.0 in | Wt 220.0 lb

## 2023-06-28 DIAGNOSIS — E1169 Type 2 diabetes mellitus with other specified complication: Secondary | ICD-10-CM | POA: Diagnosis not present

## 2023-06-28 DIAGNOSIS — Z7985 Long-term (current) use of injectable non-insulin antidiabetic drugs: Secondary | ICD-10-CM

## 2023-06-28 DIAGNOSIS — I152 Hypertension secondary to endocrine disorders: Secondary | ICD-10-CM

## 2023-06-28 DIAGNOSIS — E1159 Type 2 diabetes mellitus with other circulatory complications: Secondary | ICD-10-CM

## 2023-06-28 DIAGNOSIS — E785 Hyperlipidemia, unspecified: Secondary | ICD-10-CM | POA: Diagnosis not present

## 2023-06-28 DIAGNOSIS — Z7984 Long term (current) use of oral hypoglycemic drugs: Secondary | ICD-10-CM

## 2023-06-28 LAB — POCT GLYCOSYLATED HEMOGLOBIN (HGB A1C): Hemoglobin A1C: 8.8 % — AB (ref 4.0–5.6)

## 2023-06-28 NOTE — Patient Instructions (Signed)
Diabetes mellitus y nutricin, en adultos Diabetes Mellitus and Nutrition, Adult Si sufre de diabetes, o diabetes mellitus, es muy importante tener hbitos alimenticios saludables debido a que sus niveles de Psychologist, counselling sangre (glucosa) se ven afectados en gran medida por lo que come y bebe. Comer alimentos saludables en las cantidades correctas, aproximadamente a la misma hora todos los [place], [place] ayudar a: Chief Operating Officer su glucemia. Disminuir el riesgo de sufrir una enfermedad cardaca. Mejorar la presin arterial. Barista o mantener un peso saludable. Qu puede afectar mi plan de alimentacin? Todas las personas que sufren de diabetes son diferentes y cada una tiene necesidades diferentes en cuanto a un plan de alimentacin. El mdico puede recomendarle que trabaje con un nutricionista para elaborar el mejor plan para usted. Su plan de alimentacin puede variar segn factores como: Las caloras que necesita. Los medicamentos que toma. Su peso. Sus niveles de glucemia, presin arterial y colesterol. Su nivel de [place]. Otras afecciones que tenga, como enfermedades cardacas o renales. Cmo me afectan los carbohidratos? Los carbohidratos, o hidratos de carbono, afectan su nivel de glucemia ms que cualquier otro tipo de alimento. La ingesta de carbohidratos aumenta la cantidad de [employer]. Es importante conocer la cantidad de carbohidratos que se pueden ingerir en cada comida sin correr Surveyor, minerals. Esto es Government social research officer. Su nutricionista puede ayudarlo a calcular la cantidad de carbohidratos que debe ingerir en cada comida y en cada refrigerio. Cmo me afecta el alcohol? El alcohol puede provocar una disminucin de la glucemia (hipoglucemia), especialmente si [place] insulina o toma determinados medicamentos por va oral para la diabetes. La hipoglucemia es una afeccin potencialmente mortal. Los sntomas de la hipoglucemia, como somnolencia, mareos y confusin, son  similares a los sntomas de haber consumido demasiado alcohol. No beba alcohol si: Su mdico le indica no hacerlo. Est embarazada, puede estar embarazada o est tratando de [place]. Si bebe alcohol: Limite la cantidad que bebe a lo siguiente: De 0 a 1 medida por da para las mujeres. De 0 a 2 medidas por da para los hombres. Sepa cunta cantidad de alcohol hay en las bebidas que toma. En los [address], una medida equivale a una botella de cerveza de 12 oz (355 ml), un vaso de vino de 5 oz (148 ml) o un vaso de una bebida alcohlica de alta graduacin de 1 oz (44 ml). Mantngase hidratado bebiendo agua, refrescos dietticos o t helado sin azcar. Tenga en cuenta que los refrescos comunes, los jugos y otras bebidas para mezclar pueden contener Product/process development scientist y se deben contar como carbohidratos. Consejos para seguir Social worker las etiquetas de los alimentos Comience por leer el tamao de la porcin en la etiqueta de Informacin nutricional de los alimentos envasados y las bebidas. La cantidad de caloras, carbohidratos, grasas y otros nutrientes detallados en la etiqueta se basan en una porcin del alimento. Muchos alimentos contienen ms de una porcin por envase. Verifique la cantidad total de gramos (g) de carbohidratos totales en una porcin. Verifique la cantidad de gramos de grasas saturadas y grasas trans en una porcin. Escoja alimentos que no contengan estas grasas o que su contenido de estas sea [place]. Verifique la cantidad de miligramos (mg) de sal (sodio) en una porcin. La [employer] de las personas deben limitar la ingesta de sodio total a menos de 2300 mg [employer]. Siempre consulte la informacin nutricional de los alimentos etiquetados como "con bajo contenido de grasa" o "sin grasa".  Estos alimentos pueden tener un mayor contenido de International aid/development worker agregada o carbohidratos refinados, y deben evitarse. Hable con su nutricionista para identificar sus objetivos diarios en  cuanto a los nutrientes mencionados en la etiqueta. Al ir de compras Evite comprar alimentos procesados, enlatados o precocidos. Estos alimentos tienden a Counselling psychologist mayor cantidad de [place], sodio y azcar agregada. Compre en la zona exterior de la tienda de comestibles. Esta es la zona donde se encuentran con mayor frecuencia las frutas y las verduras frescas, los cereales a granel, las carnes frescas y los productos lcteos frescos. Al cocinar Use mtodos de coccin a baja temperatura, como hornear, en lugar de mtodos de coccin a alta temperatura, como frer en abundante aceite. Cocine con aceites saludables, como el aceite de [place], canola o [place]. Evite cocinar con manteca, crema o carnes con alto contenido de grasa. Planificacin de las comidas Coma las comidas y los refrigerios regularmente, preferentemente a la misma hora todos [place]. Evite pasar largos perodos de tiempo sin comer. Consuma alimentos ricos en fibra, como frutas frescas, verduras, frijoles y cereales integrales. Consuma entre 4 y 6 onzas (entre 112 y 168 g) de protenas magras por da, como carnes [place], pollo, pescado, huevos o tofu. Una onza (oz) (28 g) de protena magra equivale a: 1 onza (28 g) de carne, pollo o pescado. 1 huevo.  taza (62 g) de tofu. Coma algunos alimentos por da que contengan grasas saludables, como aguacates, frutos secos, semillas y pescado. Qu alimentos debo comer? Nils Pyle Bayas. Manzanas. Naranjas. Duraznos. Damascos. Ciruelas. Uvas. Mangos. Papayas. Granadas. Kiwi. Cerezas. Verduras Verduras de [employer], que incluyen [place], [place], col rizada, acelga, hojas de berza, hojas de mostaza y repollo. Remolachas. Coliflor. Brcoli. Zanahorias. Judas verdes. Tomates. Pimientos. Cebollas. Pepinos. Coles de Bruselas. Granos Granos integrales, como panes, galletas, tortillas, cereales y pastas de salvado o integrales. Avena sin azcar. Quinua. Arroz integral o salvaje. Carnes y otras  protenas Frutos de mar. Carne de ave sin piel. Cortes magros de ave y carne de res. Tofu. Frutos secos. Semillas. Lcteos Productos lcteos sin grasa o con bajo contenido de [place], [place], yogur y [place]. Es posible que los productos detallados arriba no constituyan una lista completa de los alimentos y las bebidas que puede tomar. Consulte a un nutricionista para obtener ms informacin. Qu alimentos debo evitar? Nils Pyle Frutas enlatadas al almbar. Verduras Verduras enlatadas. Verduras congeladas con mantequilla o salsa de crema. Granos Productos elaborados con [place] y [place], como panes, pastas, bocadillos y cereales. Evite todos los alimentos procesados. Carnes y [address] de carne con alto contenido de Holiday representative. Carne de ave con piel. Carnes empanizadas o fritas. Carne procesada. Evite las grasas saturadas. Lcteos Yogur, queso o [employer]. Bebidas Bebidas azucaradas, como gaseosas o t helado. Es posible que los productos que se enumeran ms [place] no constituyan una lista completa de los alimentos y las bebidas que Personnel officer. Consulte a un nutricionista para obtener ms informacin. Preguntas para hacerle al mdico Debo consultar con un especialista certificado en atencin y educacin sobre la diabetes? Es necesario que me rena con un nutricionista? A qu nmero puedo llamar si tengo preguntas? Cules son los mejores momentos para controlar la glucemia? Dnde encontrar ms informacin: American Diabetes Association (Asociacin Estadounidense de la Diabetes): diabetes.org Academy of Nutrition and Dietetics (Academia de Nutricin y Pension scheme manager): eatright.[employer] of Diabetes and Digestive and Kidney Diseases [employer] de la Diabetes y las Enfermedades Digestivas y Renales): [email or web address].si Association of Diabetes  Care & Education Specialists (Asociacin de Especialistas en Atencin y Francella Solian la Diabetes):  diabeteseducator.org Resumen Es importante tener hbitos alimenticios saludables debido a que sus niveles de Psychologist, counselling sangre (glucosa) se ven afectados en gran medida por lo que come y bebe. Es importante consumir alcohol con prudencia. Un plan de comidas saludable lo ayudar a controlar la glucosa en sangre y a reducir el riesgo de enfermedades cardacas. El mdico puede recomendarle que trabaje con un nutricionista para elaborar el mejor plan para usted. Esta informacin no tiene Theme park manager el consejo del mdico. Asegrese de hacerle al mdico cualquier pregunta que tenga. Document Revised: 01/24/2020 Document Reviewed: 01/24/2020 Elsevier Patient Education  2024 [employer].

## 2023-06-28 NOTE — Assessment & Plan Note (Signed)
Hemoglobin A1c still not at goal at 8.9 Diet and nutrition discussed We will continue metformin and Farxiga and weekly Trulicity Continue atorvastatin 40 mg daily Follow-up in 3 months

## 2023-06-28 NOTE — Progress Notes (Signed)
Jackie Wells 53 y.o.   Chief Complaint  Patient presents with   Follow-up    3 month f/u for DM. Patient has 2 month supply of Trulicity at home already and has not picked up new refill made her aware that is okay.      HISTORY OF PRESENT ILLNESS: This is a 53 y.o. female here for 13-month follow-up of diabetes Since her last visit office continues taking same medications but has not make any significant improvement with her diet and nutrition Not exercising regularly. No complaints or medical concerns today. Lab Results  Component Value Date   HGBA1C 8.9 (A) 03/11/2023   Wt Readings from Last 3 Encounters:  06/28/23 220 lb (99.8 kg)  03/11/23 221 lb 8 oz (100.5 kg)  12/08/22 222 lb 8 oz (100.9 kg)     HPI   Prior to Admission medications   Medication Sig Start Date End Date Taking? Authorizing Provider  atorvastatin (LIPITOR) 40 MG tablet TAKE 1 TABLET BY MOUTH EVERY DAY 07/01/22  Yes Yaslyn Cumby, Eilleen Kempf, MD  blood glucose meter kit and supplies KIT Per insurance preference. Check blood glucose once a day. E11.9 03/14/19  Yes Lezlie Lye, Irma M, MD  Dulaglutide (TRULICITY) 3 MG/0.5ML SOPN Inject 3 mg as directed once a week. 03/11/23  Yes Malashia Kamaka, Eilleen Kempf, MD  FARXIGA 10 MG TABS tablet TAKE 1 TABLET BY MOUTH ONCE DAILY BEFORE BREAKFAST 05/15/23  Yes Georgina Quint, MD  lisinopril-hydrochlorothiazide (ZESTORETIC) 10-12.5 MG tablet TAKE 1 TABLET BY MOUTH EVERY DAY 07/01/22  Yes Georgina Quint, MD  metFORMIN (GLUCOPHAGE) 500 MG tablet Take 1 tablet (500 mg total) by mouth 2 (two) times daily with a meal. 09/09/22  Yes Elizar Alpern, Eilleen Kempf, MD    No Known Allergies  Patient Active Problem List   Diagnosis Date Noted   Adenomatous polyp of colon 05/05/2022   Internal hemorrhoids 05/05/2022   Varicose veins of left lower extremity with complications 08/13/2015   Diabetes type 2, uncontrolled 11/09/2014   Morbid obesity (HCC) 08/04/2014    Dyslipidemia associated with type 2 diabetes mellitus (HCC) 08/04/2014   Hypertension associated with diabetes (HCC) 08/04/2014    Past Medical History:  Diagnosis Date   Anemia    Diabetes mellitus without complication (HCC)    on meds   GERD (gastroesophageal reflux disease)    with certain foods   Hyperlipidemia    Hypertension    PONV (postoperative nausea and vomiting)     Past Surgical History:  Procedure Laterality Date   CESAREAN SECTION  1994   2008   LAPAROSCOPIC UNILATERAL SALPINGO OOPHERECTOMY  1993   TONSILLECTOMY  1984    Social History   Socioeconomic History   Marital status: Single    Spouse name: Not on file   Number of children: Not on file   Years of education: Not on file   Highest education level: Not on file  Occupational History   Not on file  Tobacco Use   Smoking status: Never   Smokeless tobacco: Never  Vaping Use   Vaping status: Never Used  Substance and Sexual Activity   Alcohol use: No    Alcohol/week: 0.0 standard drinks of alcohol   Drug use: No   Sexual activity: Yes    Birth control/protection: Post-menopausal  Other Topics Concern   Not on file  Social History Narrative   Not on file   Social Drivers of Health   Financial Resource Strain: Not on file  Food Insecurity: Not on file  Transportation Needs: Not on file  Physical Activity: Not on file  Stress: Not on file  Social Connections: Not on file  Intimate Partner Violence: Not on file    Family History  Problem Relation Age of Onset   Colon polyps Mother    Hyperlipidemia Mother    Hypertension Mother    Hypertension Father    Colon cancer Neg Hx    Esophageal cancer Neg Hx    Rectal cancer Neg Hx    Stomach cancer Neg Hx      Review of Systems  Constitutional: Negative.  Negative for chills and fever.  HENT: Negative.  Negative for congestion and sore throat.   Respiratory: Negative.  Negative for cough and shortness of breath.   Cardiovascular:  Negative.  Negative for chest pain and palpitations.  Gastrointestinal:  Negative for abdominal pain, diarrhea, nausea and vomiting.  Genitourinary: Negative.  Negative for dysuria.  Skin: Negative.  Negative for rash.  Neurological: Negative.  Negative for dizziness and headaches.  All other systems reviewed and are negative.   Vitals:   06/28/23 0847  BP: 118/78  Pulse: 98  Temp: 98 F (36.7 C)  SpO2: 95%    Physical Exam Vitals reviewed.  Constitutional:      Appearance: Normal appearance.  HENT:     Head: Normocephalic.  Eyes:     Extraocular Movements: Extraocular movements intact.  Cardiovascular:     Rate and Rhythm: Normal rate and regular rhythm.     Pulses: Normal pulses.     Heart sounds: Normal heart sounds.  Pulmonary:     Effort: Pulmonary effort is normal.     Breath sounds: Normal breath sounds.  Skin:    General: Skin is warm and dry.     Capillary Refill: Capillary refill takes less than 2 seconds.  Neurological:     General: No focal deficit present.     Mental Status: She is alert and oriented to person, place, and time.  Psychiatric:        Mood and Affect: Mood normal.        Behavior: Behavior normal.    Results for orders placed or performed in visit on 06/28/23 (from the past 24 hours)  POCT HgB A1C     Status: Abnormal   Collection Time: 06/28/23  8:56 AM  Result Value Ref Range   Hemoglobin A1C 8.8 (A) 4.0 - 5.6 %   HbA1c POC (<> result, manual entry)     HbA1c, POC (prediabetic range)     HbA1c, POC (controlled diabetic range)        ASSESSMENT & PLAN: A total of 44 minutes was spent with the patient and counseling/coordination of care regarding preparing for this visit, review of most recent office visit notes, review of multiple chronic medical conditions and their management, cardiovascular risks associated with uncontrolled diabetes, review of all medications, review of most recent bloodwork results including interpretation of  today's hemoglobin A1c, review of health maintenance items, education on nutrition, prognosis, documentation, and need for follow up.   Problem List Items Addressed This Visit       Cardiovascular and Mediastinum   Hypertension associated with diabetes (HCC) - Primary   Well-controlled hyper tension Continue Zestoretic 10-12.5 mg daily Hemoglobin A1c at 8.8, still not at goal Cardiovascular risks associated with uncontrolled diabetes discussed Diet and nutrition discussed Recommend to continue metformin 500 mg twice a day and Farxiga 10 mg daily Continue Trulicity  dose to 3 mg weekly We will follow-up in 3 months        Endocrine   Dyslipidemia associated with type 2 diabetes mellitus (HCC)   Hemoglobin A1c still not at goal at 8.9 Diet and nutrition discussed We will continue metformin and Farxiga and weekly Trulicity Continue atorvastatin 40 mg daily Follow-up in 3 months      Relevant Orders   POCT HgB A1C (Completed)     Other   Morbid obesity (HCC)   Cardiovascular risks associated with obesity discussed Diet and nutrition discussed Benefits of exercise discussed Advised to decrease amount of daily carbohydrate intake and daily calories and increase amount of plant-based protein in her diet We will follow-up in 3 months      Patient Instructions  Diabetes mellitus y nutricin, en adultos Diabetes Mellitus and Nutrition, Adult Si sufre de diabetes, o diabetes mellitus, es muy importante tener hbitos alimenticios saludables debido a que sus niveles de Psychologist, counselling sangre (glucosa) se ven afectados en gran medida por lo que come y bebe. Comer alimentos saludables en las cantidades correctas, aproximadamente a la misma hora todos los [place], [place] ayudar a: Chief Operating Officer su glucemia. Disminuir el riesgo de sufrir una enfermedad cardaca. Mejorar la presin arterial. Barista o mantener un peso saludable. Qu puede afectar mi plan de alimentacin? Todas las personas que  sufren de diabetes son diferentes y cada una tiene necesidades diferentes en cuanto a un plan de alimentacin. El mdico puede recomendarle que trabaje con un nutricionista para elaborar el mejor plan para usted. Su plan de alimentacin puede variar segn factores como: Las caloras que necesita. Los medicamentos que toma. Su peso. Sus niveles de glucemia, presin arterial y colesterol. Su nivel de [place]. Otras afecciones que tenga, como enfermedades cardacas o renales. Cmo me afectan los carbohidratos? Los carbohidratos, o hidratos de carbono, afectan su nivel de glucemia ms que cualquier otro tipo de alimento. La ingesta de carbohidratos aumenta la cantidad de [employer]. Es importante conocer la cantidad de carbohidratos que se pueden ingerir en cada comida sin correr Surveyor, minerals. Esto es Government social research officer. Su nutricionista puede ayudarlo a calcular la cantidad de carbohidratos que debe ingerir en cada comida y en cada refrigerio. Cmo me afecta el alcohol? El alcohol puede provocar una disminucin de la glucemia (hipoglucemia), especialmente si [place] insulina o toma determinados medicamentos por va oral para la diabetes. La hipoglucemia es una afeccin potencialmente mortal. Los sntomas de la hipoglucemia, como somnolencia, mareos y confusin, son similares a los sntomas de haber consumido demasiado alcohol. No beba alcohol si: Su mdico le indica no hacerlo. Est embarazada, puede estar embarazada o est tratando de [place]. Si bebe alcohol: Limite la cantidad que bebe a lo siguiente: De 0 a 1 medida por da para las mujeres. De 0 a 2 medidas por da para los hombres. Sepa cunta cantidad de alcohol hay en las bebidas que toma. En los [address], una medida equivale a una botella de cerveza de 12 oz (355 ml), un vaso de vino de 5 oz (148 ml) o un vaso de una bebida alcohlica de alta graduacin de 1 oz (44 ml). Mantngase hidratado bebiendo agua,  refrescos dietticos o t helado sin azcar. Tenga en cuenta que los refrescos comunes, los jugos y otras bebidas para mezclar pueden contener Product/process development scientist y se deben contar como carbohidratos. Consejos para seguir Social worker las etiquetas de los alimentos Comience por leer el tamao de  la porcin en la etiqueta de Informacin nutricional de los alimentos envasados y las bebidas. La cantidad de caloras, carbohidratos, grasas y otros nutrientes detallados en la etiqueta se basan en una porcin del alimento. Muchos alimentos contienen ms de una porcin por envase. Verifique la cantidad total de gramos (g) de carbohidratos totales en una porcin. Verifique la cantidad de gramos de grasas saturadas y grasas trans en una porcin. Escoja alimentos que no contengan estas grasas o que su contenido de estas sea Athena. Verifique la cantidad de miligramos (mg) de sal (sodio) en una porcin. La [employer]son de las personas deben limitar la ingesta de sodio total a menos de 2300 mg Google. Siempre consulte la informacin nutricional de los alimentos etiquetados como "con bajo contenido de grasa" o "sin grasa". Estos alimentos pueden tener un mayor contenido de International aid/development worker agregada o carbohidratos refinados, y deben evitarse. Hable con su nutricionista para identificar sus objetivos diarios en cuanto a los nutrientes mencionados en la etiqueta. Al ir de compras Evite comprar alimentos procesados, enlatados o precocidos. Estos alimentos tienden a Counselling psychologist mayor cantidad de Protection, sodio y azcar agregada. Compre en la zona exterior de la tienda de comestibles. Esta es la zona donde se encuentran con mayor frecuencia las frutas y las verduras frescas, los cereales a granel, las carnes frescas y los productos lcteos frescos. Al cocinar Use mtodos de coccin a baja temperatura, como hornear, en lugar de mtodos de coccin a alta temperatura, como frer en abundante aceite. Cocine con aceites saludables, como el aceite  de [place], canola o [place]. Evite cocinar con manteca, crema o carnes con alto contenido de grasa. Planificacin de las comidas Coma las comidas y los refrigerios regularmente, preferentemente a la misma hora todos [place]. Evite pasar largos perodos de tiempo sin comer. Consuma alimentos ricos en fibra, como frutas frescas, verduras, frijoles y cereales integrales. Consuma entre 4 y 6 onzas (entre 112 y 168 g) de protenas magras por da, como carnes [place], pollo, pescado, huevos o tofu. Una onza (oz) (28 g) de protena magra equivale a: 1 onza (28 g) de carne, pollo o pescado. 1 huevo.  taza (62 g) de tofu. Coma algunos alimentos por da que contengan grasas saludables, como aguacates, frutos secos, semillas y pescado. Qu alimentos debo comer? Nils Pyle Bayas. Manzanas. Naranjas. Duraznos. Damascos. Ciruelas. Uvas. Mangos. Papayas. Granadas. Kiwi. Cerezas. Verduras Verduras de Marriott, que incluyen [place], [place], col rizada, acelga, hojas de berza, hojas de mostaza y repollo. Remolachas. Coliflor. Brcoli. Zanahorias. Judas verdes. Tomates. Pimientos. Cebollas. Pepinos. Coles de Bruselas. Granos Granos integrales, como panes, galletas, tortillas, cereales y pastas de salvado o integrales. Avena sin azcar. Quinua. Arroz integral o salvaje. Carnes y otras protenas Frutos de mar. Carne de ave sin piel. Cortes magros de ave y carne de res. Tofu. Frutos secos. Semillas. Lcteos Productos lcteos sin grasa o con bajo contenido de [place], [place] [employer], yogur y [place]. Es posible que los productos detallados arriba no constituyan una lista completa de los alimentos y las bebidas que puede tomar. Consulte a un nutricionista para obtener ms informacin. Qu alimentos debo evitar? Nils Pyle Frutas enlatadas al almbar. Verduras Verduras enlatadas. Verduras congeladas con mantequilla o salsa de crema. Granos Productos elaborados con [place] y [place], como panes, pastas,  bocadillos y cereales. Evite todos los alimentos procesados. Carnes y [address] de carne con alto contenido de Holiday representative. Carne de ave con piel. Carnes empanizadas o fritas. Carne procesada. Evite las grasas saturadas. Lcteos Yogur,  queso o Cardinal Health. Bebidas Bebidas azucaradas, como gaseosas o t helado. Es posible que los productos que se enumeran ms [place] no constituyan una lista completa de los alimentos y las bebidas que Personnel officer. Consulte a un nutricionista para obtener ms informacin. Preguntas para hacerle al mdico Debo consultar con un especialista certificado en atencin y educacin sobre la diabetes? Es necesario que me rena con un nutricionista? A qu nmero puedo llamar si tengo preguntas? Cules son los mejores momentos para controlar la glucemia? Dnde encontrar ms informacin: American Diabetes Association (Asociacin Estadounidense de la Diabetes): diabetes.org Academy of Nutrition and Dietetics (Academia de Nutricin y Pension scheme manager): eatright.Dana Corporation of Diabetes and Digestive and Kidney Diseases [employer]ere & Company de la Diabetes y las Enfermedades Digestivas y Renales): StageSync.si Association of Diabetes Care & Education Specialists (Asociacin de Especialistas en Atencin y Educacin sobre la Diabetes): diabeteseducator.org Resumen Es importante tener hbitos alimenticios saludables debido a que sus niveles de Psychologist, counselling sangre (glucosa) se ven afectados en gran medida por lo que come y bebe. Es importante consumir alcohol con prudencia. Un plan de comidas saludable lo ayudar a controlar la glucosa en sangre y a reducir el riesgo de enfermedades cardacas. El mdico puede recomendarle que trabaje con un nutricionista para elaborar el mejor plan para usted. Esta informacin no tiene Theme park manager el consejo del mdico. Asegrese de hacerle al mdico cualquier pregunta que tenga. Document Revised: 01/24/2020 Document  Reviewed: 01/24/2020 Elsevier Patient Education  2024 Elsevier Inc.      Edwina Barth, MD [facility]

## 2023-06-28 NOTE — Assessment & Plan Note (Signed)
Cardiovascular risks associated with obesity discussed Diet and nutrition discussed Benefits of exercise discussed Advised to decrease amount of daily carbohydrate intake and daily calories and increase amount of plant-based protein in her diet We will follow-up in 3 months

## 2023-06-28 NOTE — Assessment & Plan Note (Signed)
Well-controlled hyper tension Continue Zestoretic 10-12.5 mg daily Hemoglobin A1c at 8.8, still not at goal Cardiovascular risks associated with uncontrolled diabetes discussed Diet and nutrition discussed Recommend to continue metformin 500 mg twice a day and Farxiga 10 mg daily Continue Trulicity dose to 3 mg weekly We will follow-up in 3 months

## 2023-07-05 IMAGING — MG MM DIGITAL SCREENING BILAT W/ TOMO AND CAD
8 series · 8 of 24 positions shown · non-contrast
Comparison: Previous exam(s).

CLINICAL DATA: Screening.

EXAM:
DIGITAL SCREENING BILATERAL MAMMOGRAM WITH TOMOSYNTHESIS AND CAD
TECHNIQUE: Bilateral screening digital craniocaudal and mediolateral oblique
mammograms were obtained. Bilateral screening digital breast
tomosynthesis was performed. The images were evaluated with
computer-aided detection.

[R MLO synth-2D]
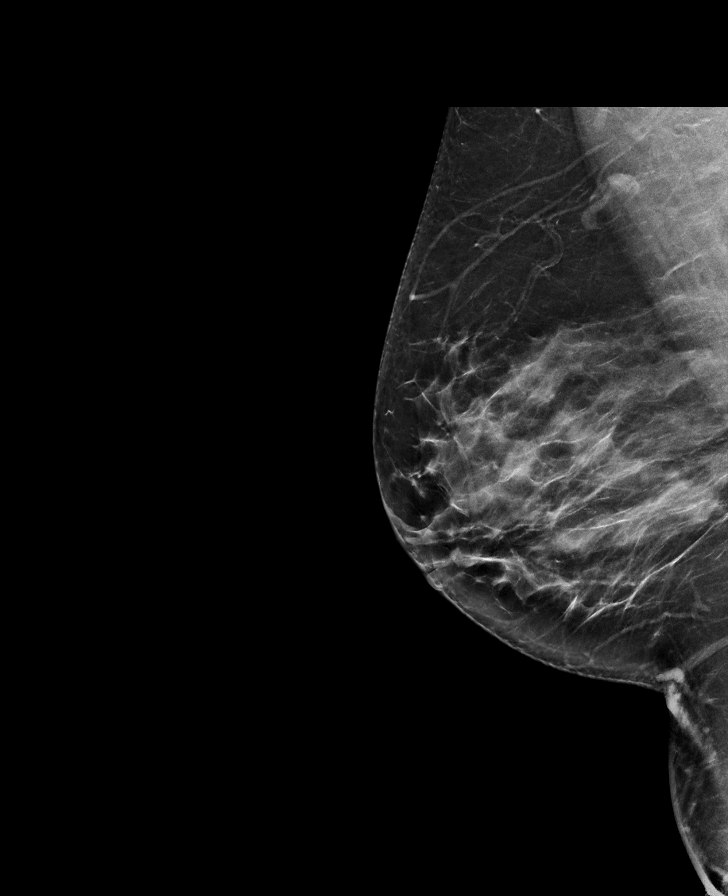

[L MLO synth-2D]
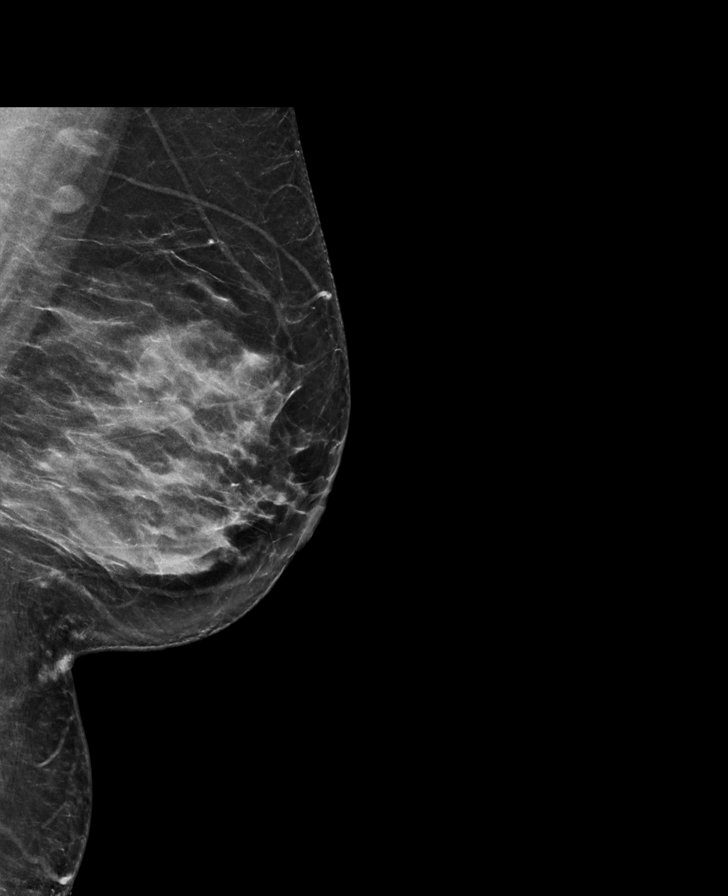

[L CC synth-2D]
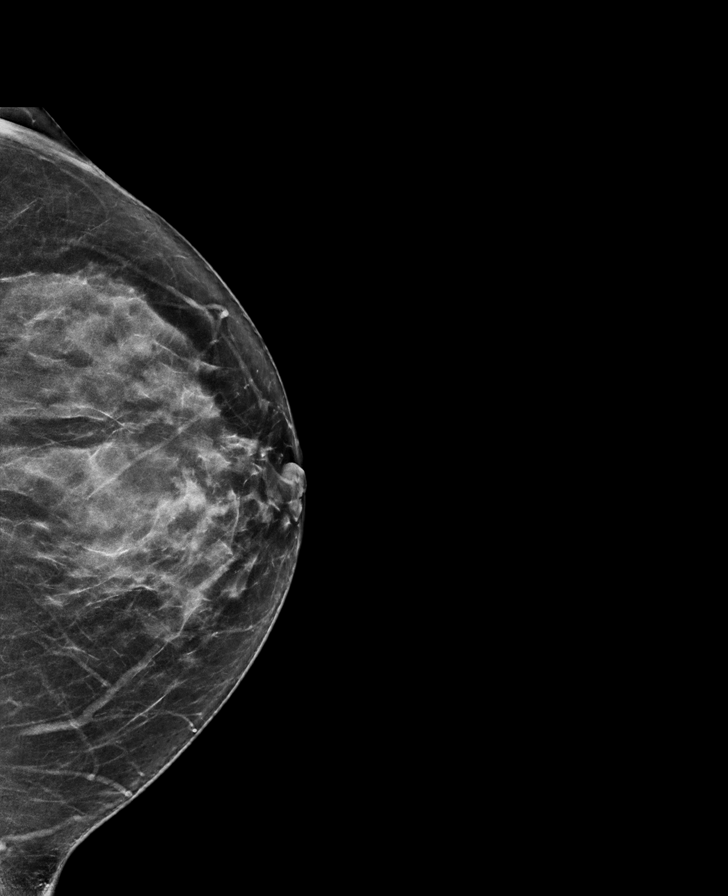

[R CC synth-2D]
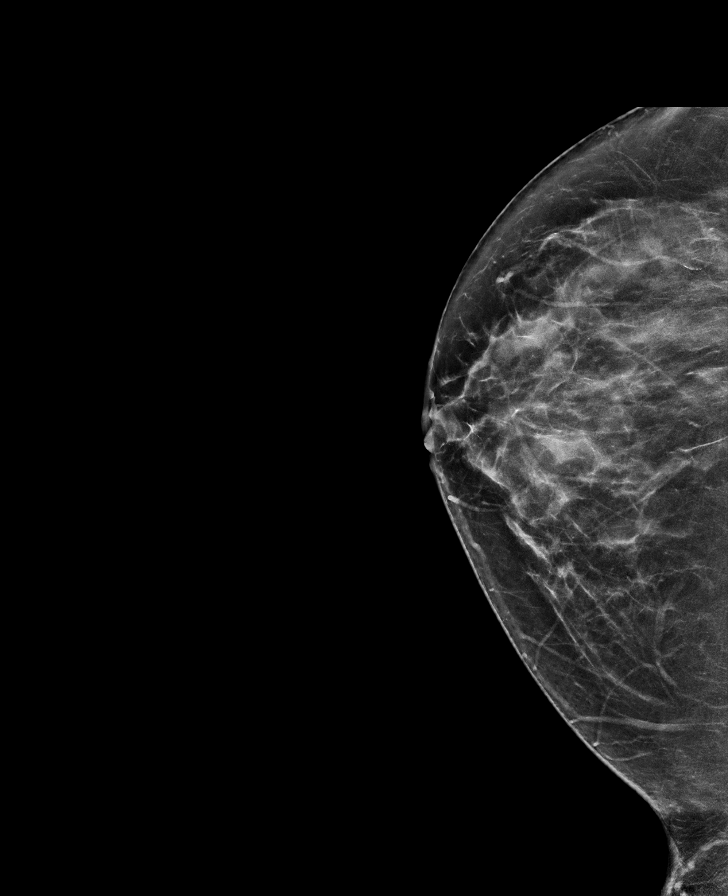

[R CC tomo · tomo slice 35/68.0]
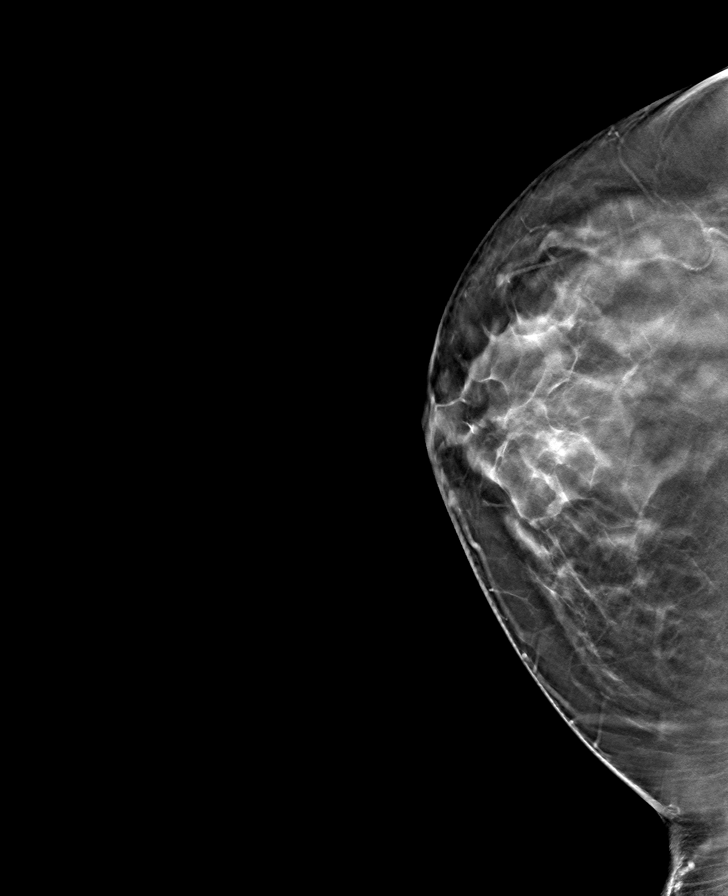

[L CC tomo · tomo slice 35/69.0]
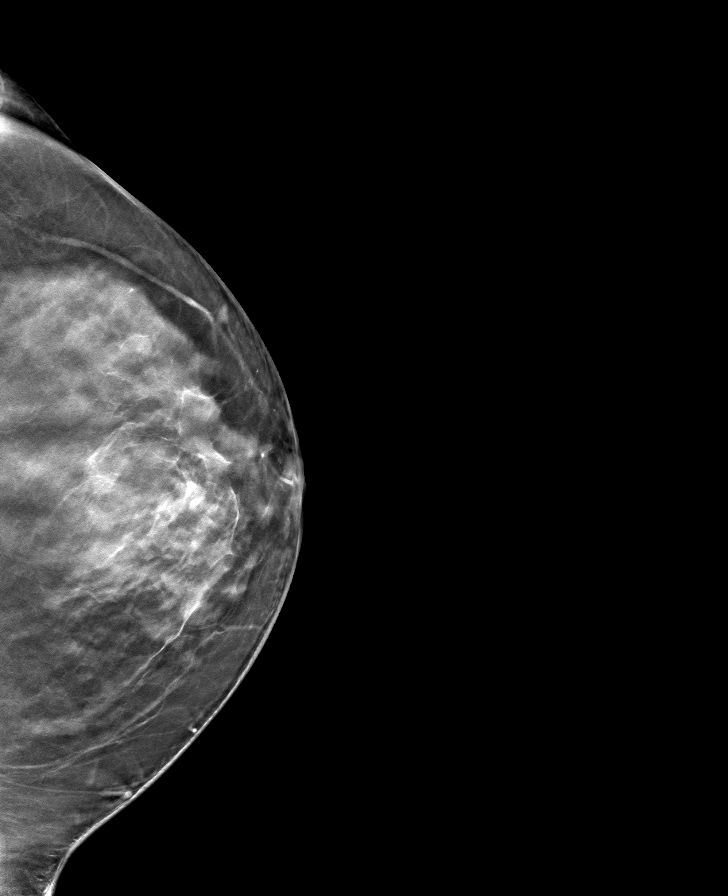

[L MLO tomo · tomo slice 38/75.0]
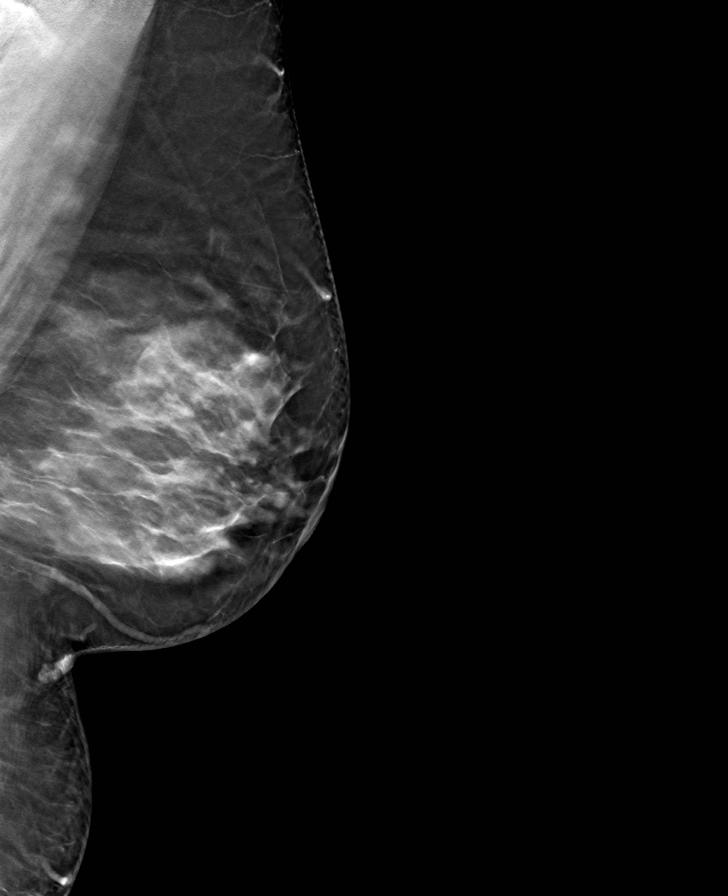

[R MLO tomo · tomo slice 39/77.0]
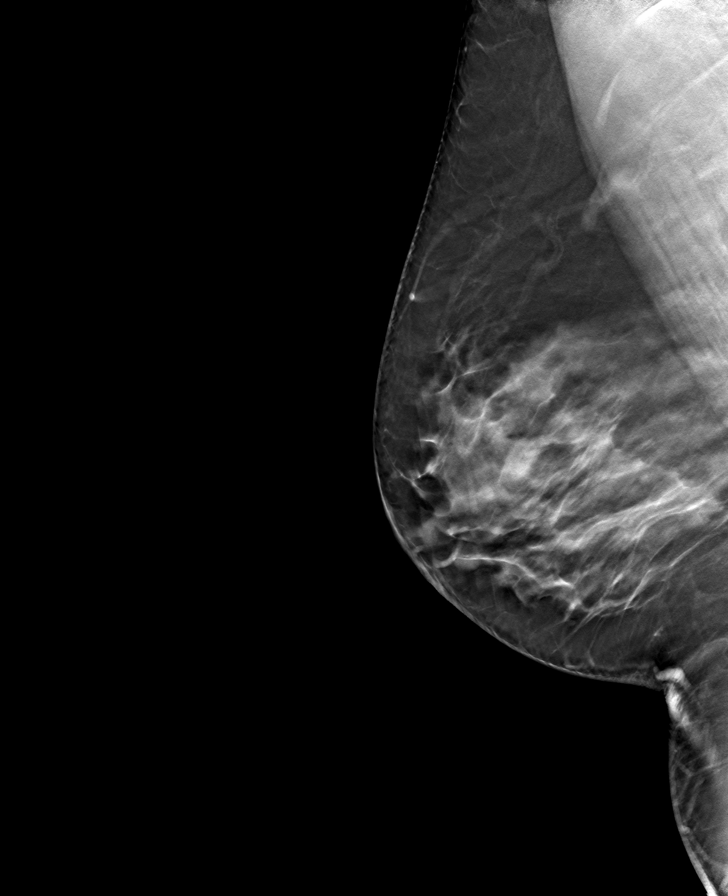

[8 of 24 positions shown; findings below may reference images not displayed]

ACR Breast Density Category d: The breast tissue is extremely dense,
which lowers the sensitivity of mammography
FINDINGS: There are no findings suspicious for malignancy.
IMPRESSION: No mammographic evidence of malignancy. A result letter of this
screening mammogram will be mailed directly to the patient.

RECOMMENDATION:
Screening mammogram in one year. (Code:[id number]-[id number])

BI-RADS CATEGORY  1: Negative.

## 2023-08-02 ENCOUNTER — Other Ambulatory Visit: Payer: Self-pay | Admitting: Emergency Medicine

## 2023-08-02 DIAGNOSIS — E782 Mixed hyperlipidemia: Secondary | ICD-10-CM

## 2023-08-05 ENCOUNTER — Other Ambulatory Visit: Payer: Self-pay | Admitting: Emergency Medicine

## 2023-08-05 DIAGNOSIS — I10 Essential (primary) hypertension: Secondary | ICD-10-CM

## 2023-08-05 NOTE — Telephone Encounter (Signed)
 Copied from CRM [id number]. Topic: Clinical - Medication Refill >> Aug 05, 2023  5:59 PM Eunice Blase wrote: Most Recent Primary Care Visit:  Provider: Georgina Quint  Department: [facility] [place]  Visit Type: OFFICE VISIT  Date: 06/28/2023  Medication: lisinopril-hydrochlorothiazide (ZESTORETIC) 10-12.5 MG tablet  Has the patient contacted their pharmacy? Yes (Agent: If no, request that the patient contact the pharmacy for the refill. If patient does not wish to contact the pharmacy document the reason why and proceed with request.) (Agent: If yes, when and what did the pharmacy advise?)Pharmacy needs approval from PCP  Is this the correct pharmacy for this prescription? Yes If no, delete pharmacy and type the correct one.  This is the patient's preferred pharmacy:    [id number] - [place], [place] - [address] AT [facility] [address] [place] [address] Phone: [phone] Fax: [phone]   Has the prescription been filled recently? Yes  Is the patient out of the medication? Yes  Has the patient been seen for an appointment in the last year OR does the patient have an upcoming appointment? Yes  Can we respond through MyChart? Yes  Agent: Please be advised that Rx refills may take up to 3 business days. We ask that you follow-up with your pharmacy.

## 2023-08-06 ENCOUNTER — Other Ambulatory Visit: Payer: Self-pay | Admitting: Emergency Medicine

## 2023-08-06 DIAGNOSIS — I10 Essential (primary) hypertension: Secondary | ICD-10-CM

## 2023-08-06 NOTE — Telephone Encounter (Signed)
 Copied from CRM [id number]. Topic: Clinical - Medication Refill >> Aug 06, 2023  4:55 PM Eunice Blase wrote: Most Recent Primary Care Visit:  Provider: Georgina Quint  Department: [facility] [place]  Visit Type: OFFICE VISIT  Date: 06/28/2023  Medication: lisinopril-hydrochlorothiazide (ZESTORETIC) 10-12.5 MG tablet  Has the patient contacted their pharmacy? Yes (Agent: If no, request that the patient contact the pharmacy for the refill. If patient does not wish to contact the pharmacy document the reason why and proceed with request.) (Agent: If yes, when and what did the pharmacy advise?)Pharmacy needs PCP approval  Is this the correct pharmacy for this prescription? Yes If no, delete pharmacy and type the correct one.  This is the patient's preferred pharmacy:    [id number] - [place], [place] - [address] AT [facility] [address] [place] [address] Phone: [phone] Fax: [phone]   Has the prescription been filled recently? Yes  Is the patient out of the medication? Yes  Has the patient been seen for an appointment in the last year OR does the patient have an upcoming appointment? Yes  Can we respond through MyChart? Yes  Agent: Please be advised that Rx refills may take up to 3 business days. We ask that you follow-up with your pharmacy.

## 2023-08-06 NOTE — Telephone Encounter (Signed)
 Last Fill: 07/01/22  Last OV: 06/28/23 Next OV: 09/28/23  Routing to provider for review/authorization.   Copied from CRM [id number]. Topic: Clinical - Medication Refill >> Aug 05, 2023  5:59 PM Eunice Blase wrote: Most Recent Primary Care Visit:  Provider: Georgina Quint  Department: [facility] [place]  Visit Type: OFFICE VISIT  Date: 06/28/2023  Medication: lisinopril-hydrochlorothiazide (ZESTORETIC) 10-12.5 MG tablet  Has the patient contacted their pharmacy? Yes (Agent: If no, request that the patient contact the pharmacy for the refill. If patient does not wish to contact the pharmacy document the reason why and proceed with request.) (Agent: If yes, when and what did the pharmacy advise?)Pharmacy needs approval from PCP  Is this the correct pharmacy for this prescription? Yes If no, delete pharmacy and type the correct one.  This is the patient's preferred pharmacy:    [id number] - [place], [place] - [address] AT [facility] [address]  [place] [address] Phone: [phone] Fax: [phone]   Has the prescription been filled recently? Yes  Is the patient out of the medication? Yes  Has the patient been seen for an appointment in the last year OR does the patient have an upcoming appointment? Yes  Can we respond through MyChart? Yes  Agent: Please be advised that Rx refills may take up to 3 business days. We ask that you follow-up with your pharmacy.

## 2023-08-10 ENCOUNTER — Other Ambulatory Visit: Payer: Self-pay | Admitting: Emergency Medicine

## 2023-08-10 DIAGNOSIS — I10 Essential (primary) hypertension: Secondary | ICD-10-CM

## 2023-08-10 MED ORDER — LISINOPRIL-HYDROCHLOROTHIAZIDE 10-12.5 MG PO TABS: 1.0000 | ORAL_TABLET | Freq: Every day | ORAL | 3 refills | Status: AC

## 2023-08-10 NOTE — Telephone Encounter (Signed)
 Copied from CRM [id number]. Topic: Clinical - Medication Refill >> Aug 10, 2023  9:36 AM Josefa Half C wrote: Most Recent Primary Care Visit:  Provider: Georgina Quint  Department: [facility] [place]  Visit Type: OFFICE VISIT  Date: 06/28/2023  Medication: lisinopril-hydrochlorothiazide (ZESTORETIC) 10-12.5 MG table  Has the patient contacted their pharmacy? Yes, their pharmacy did not receive the RX refill request because it was sent to the wrong pharmacy.  (Agent: If no, request that the patient contact the pharmacy for the refill. If patient does not wish to contact the pharmacy document the reason why and proceed with request.) (Agent: If yes, when and what did the pharmacy advise?)  Is this the correct pharmacy for this prescription? Yes If no, delete pharmacy and type the correct one.  This is the patient's preferred pharmacy:   [id number] - [place], [place] - [address] AT [facility] [address] [place] [address] Phone: [phone] Fax: [phone]   Has the prescription been filled recently? No  Is the patient out of the medication? Yes  Has the patient been seen for an appointment in the last year OR does the patient have an upcoming appointment? Yes  Can we respond through MyChart? No  Agent: Please be advised that Rx refills may take up to 3 business days. We ask that you follow-up with your pharmacy.

## 2023-08-12 LAB — HM DIABETES EYE EXAM

## 2023-08-23 ENCOUNTER — Other Ambulatory Visit: Payer: Self-pay | Admitting: Emergency Medicine

## 2023-08-23 DIAGNOSIS — I152 Hypertension secondary to endocrine disorders: Secondary | ICD-10-CM

## 2023-08-31 ENCOUNTER — Other Ambulatory Visit: Payer: Self-pay | Admitting: Emergency Medicine

## 2023-08-31 NOTE — Telephone Encounter (Signed)
 Copied from CRM [id number]. Topic: Clinical - Medication Refill >> Aug 31, 2023  9:34 AM Alcus Dad wrote: Most Recent Primary Care Visit:  Provider: Georgina Quint  Department: [facility]  Visit Type: OFFICE VISIT  Date: 06/28/2023  Medication: FARXIGA 10 MG TABS tablet  Has the patient contacted their pharmacy? Yes (Agent: If no, request that the patient contact the pharmacy for the refill. If patient does not wish to contact the pharmacy document the reason why and proceed with request.) (Agent: If yes, when and what did the pharmacy advise?)  Is this the correct pharmacy for this prescription? Yes If no, delete pharmacy and type the correct one.  This is the patient's preferred pharmacy:  [id number] - [place] ([place]), [place] - [address] [place] ([place]) [place] [address] Phone: [phone] Fax: [phone]   Has the prescription been filled recently? No  Is the patient out of the medication? Yes  Has the patient been seen for an appointment in the last year OR does the patient have an upcoming appointment? Yes  Can we respond through MyChart? Yes  Agent: Please be advised that Rx refills may take up to 3 business days. We ask that you follow-up with your pharmacy.

## 2023-09-28 ENCOUNTER — Ambulatory Visit: Payer: BC Managed Care – PPO | Admitting: Emergency Medicine

## 2023-10-05 ENCOUNTER — Ambulatory Visit: Admitting: Emergency Medicine

## 2023-10-05 ENCOUNTER — Encounter: Payer: Self-pay | Admitting: Emergency Medicine

## 2023-10-05 VITALS — BP 114/84 | HR 85 | Temp 98.2°F | Ht 65.0 in | Wt 224.0 lb

## 2023-10-05 DIAGNOSIS — E785 Hyperlipidemia, unspecified: Secondary | ICD-10-CM

## 2023-10-05 DIAGNOSIS — N1831 Chronic kidney disease, stage 3a: Secondary | ICD-10-CM | POA: Diagnosis not present

## 2023-10-05 DIAGNOSIS — E1159 Type 2 diabetes mellitus with other circulatory complications: Secondary | ICD-10-CM

## 2023-10-05 DIAGNOSIS — K0889 Other specified disorders of teeth and supporting structures: Secondary | ICD-10-CM

## 2023-10-05 DIAGNOSIS — I152 Hypertension secondary to endocrine disorders: Secondary | ICD-10-CM | POA: Diagnosis not present

## 2023-10-05 DIAGNOSIS — E1169 Type 2 diabetes mellitus with other specified complication: Secondary | ICD-10-CM | POA: Diagnosis not present

## 2023-10-05 DIAGNOSIS — Z7984 Long term (current) use of oral hypoglycemic drugs: Secondary | ICD-10-CM

## 2023-10-05 DIAGNOSIS — Z7985 Long-term (current) use of injectable non-insulin antidiabetic drugs: Secondary | ICD-10-CM

## 2023-10-05 LAB — COMPREHENSIVE METABOLIC PANEL WITH GFR
ALT: 24 U/L (ref 0–35)
AST: 15 U/L (ref 0–37)
Albumin: 4.3 g/dL (ref 3.5–5.2)
Alkaline Phosphatase: 83 U/L (ref 39–117)
BUN: 30 mg/dL — ABNORMAL HIGH (ref 6–23)
CO2: 25 meq/L (ref 19–32)
Calcium: 9.5 mg/dL (ref 8.4–10.5)
Chloride: 102 meq/L (ref 96–112)
Creatinine, Ser: 1.07 mg/dL (ref 0.40–1.20)
GFR: 59.54 mL/min — ABNORMAL LOW (ref >60.00)
Glucose, Bld: 197 mg/dL — ABNORMAL HIGH (ref 70–99)
Potassium: 4.2 meq/L (ref 3.5–5.1)
Sodium: 136 meq/L (ref 135–145)
Total Bilirubin: 0.5 mg/dL (ref 0.2–1.2)
Total Protein: 7.6 g/dL (ref 6.0–8.3)

## 2023-10-05 LAB — CBC WITH DIFFERENTIAL/PLATELET
Basophils Absolute: 0.1 10*3/uL (ref 0.0–0.1)
Basophils Relative: 0.7 % (ref 0.0–3.0)
Eosinophils Absolute: 0.3 10*3/uL (ref 0.0–0.7)
Eosinophils Relative: 3.1 % (ref 0.0–5.0)
HCT: 35.6 % — ABNORMAL LOW (ref 36.0–46.0)
Hemoglobin: 11.8 g/dL — ABNORMAL LOW (ref 12.0–15.0)
Lymphocytes Relative: 22.2 % (ref 12.0–46.0)
Lymphs Abs: 1.9 10*3/uL (ref 0.7–4.0)
MCHC: 33 g/dL (ref 30.0–36.0)
MCV: 86.5 fl (ref 78.0–100.0)
Monocytes Absolute: 0.6 10*3/uL (ref 0.1–1.0)
Monocytes Relative: 7 % (ref 3.0–12.0)
Neutro Abs: 5.6 10*3/uL (ref 1.4–7.7)
Neutrophils Relative %: 67 % (ref 43.0–77.0)
Platelets: 234 10*3/uL (ref 150.0–400.0)
RBC: 4.12 Mil/uL (ref 3.87–5.11)
RDW: 13.8 % (ref 11.5–15.5)
WBC: 8.4 10*3/uL (ref 4.0–10.5)

## 2023-10-05 LAB — LIPID PANEL
Cholesterol: 126 mg/dL (ref 0–200)
HDL: 39.6 mg/dL (ref >39.00)
LDL Cholesterol: 52 mg/dL (ref 0–99)
NonHDL: 86.85
Total CHOL/HDL Ratio: 3
Triglycerides: 174 mg/dL — ABNORMAL HIGH (ref 0.0–149.0)
VLDL: 34.8 mg/dL (ref 0.0–40.0)

## 2023-10-05 LAB — MICROALBUMIN / CREATININE URINE RATIO
Creatinine,U: 54.9 mg/dL
Microalb Creat Ratio: UNDETERMINED mg/g (ref 0.0–30.0)
Microalb, Ur: <0.7 mg/dL

## 2023-10-05 LAB — POCT GLYCOSYLATED HEMOGLOBIN (HGB A1C): Hemoglobin A1C: 9.1 % — AB (ref 4.0–5.6)

## 2023-10-05 MED ORDER — AMOXICILLIN 500 MG PO CAPS: 500.0000 mg | ORAL_CAPSULE | Freq: Three times a day (TID) | ORAL | 0 refills | Status: AC

## 2023-10-05 MED ORDER — IBUPROFEN 800 MG PO TABS: 800.0000 mg | ORAL_TABLET | Freq: Three times a day (TID) | ORAL | 0 refills | Status: AC | PRN

## 2023-10-05 NOTE — Assessment & Plan Note (Signed)
 Cardiovascular risks associated with obesity discussed Diet and nutrition discussed Benefits of exercise discussed Advised to decrease amount of daily carbohydrate intake and daily calories and increase amount of plant-based protein in her diet We will follow-up in 3 months Refer to diabetic nutritional services

## 2023-10-05 NOTE — Patient Instructions (Signed)
 Diabetes mellitus y nutricin, en adultos Diabetes Mellitus and Nutrition, Adult Si sufre de diabetes, o diabetes mellitus, es muy importante tener hbitos alimenticios saludables debido a que sus niveles de Psychologist, counselling sangre (glucosa) se ven afectados en gran medida por lo que come y bebe. [place] alimentos saludables en las cantidades correctas, aproximadamente a la misma hora todos los [place], [place] ayudar a: Chief Operating Officer su glucemia. Disminuir el riesgo de sufrir una enfermedad cardaca. Mejorar la presin arterial. Barista o mantener un peso saludable. Qu puede afectar mi plan de alimentacin? Todas las personas que sufren de diabetes son diferentes y cada una tiene necesidades diferentes en cuanto a un plan de alimentacin. El mdico puede recomendarle que trabaje con un nutricionista para elaborar el mejor plan para usted. Su plan de alimentacin puede variar segn factores como: Las caloras que necesita. Los medicamentos que toma. Su peso. Sus niveles de glucemia, presin arterial y colesterol. Su nivel de [place]. Otras afecciones que tenga, como enfermedades cardacas o renales. Cmo me afectan los carbohidratos? Los carbohidratos, o hidratos de carbono, afectan su nivel de glucemia ms que cualquier otro tipo de alimento. La ingesta de carbohidratos aumenta la cantidad de [employer]. Es importante conocer la cantidad de carbohidratos que se pueden ingerir en cada comida sin correr Surveyor, minerals. Esto es Government social research officer. Su nutricionista puede ayudarlo a calcular la cantidad de carbohidratos que debe ingerir en cada comida y en cada refrigerio. Cmo me afecta el alcohol? El alcohol puede provocar una disminucin de la glucemia (hipoglucemia), especialmente si [place] insulina o toma determinados medicamentos por va oral para la diabetes. La hipoglucemia es una afeccin potencialmente mortal. Los sntomas de la hipoglucemia, como somnolencia, mareos y confusin, son  similares a los sntomas de haber consumido demasiado alcohol. No beba alcohol si: Su mdico le indica no hacerlo. Est embarazada, puede estar embarazada o est tratando de [place]. Si bebe alcohol: Limite la cantidad que bebe a lo siguiente: De 0 a 1 medida por da para las mujeres. De 0 a 2 medidas por da para los hombres. Sepa cunta cantidad de alcohol hay en las bebidas que toma. En los [address], una medida equivale a una botella de cerveza de 12 oz (355 ml), un vaso de vino de 5 oz (148 ml) o un vaso de una bebida alcohlica de alta graduacin de 1 oz (44 ml). Mantngase hidratado bebiendo agua, refrescos dietticos o t helado sin azcar. Tenga en cuenta que los refrescos comunes, los jugos y otras bebidas para mezclar pueden contener Product/process development scientist y se deben contar como carbohidratos. Consejos para seguir Social worker las etiquetas de los alimentos Comience por leer el tamao de la porcin en la etiqueta de Informacin nutricional de los alimentos envasados y las bebidas. La cantidad de caloras, carbohidratos, grasas y otros nutrientes detallados en la etiqueta se basan en una porcin del alimento. Muchos alimentos contienen ms de una porcin por envase. Verifique la cantidad total de gramos (g) de carbohidratos totales en una porcin. Verifique la cantidad de gramos de grasas saturadas y grasas trans en una porcin. Escoja alimentos que no contengan estas grasas o que su contenido de estas sea [place]. Verifique la cantidad de miligramos (mg) de sal (sodio) en una porcin. La [employer] de las personas deben limitar la ingesta de sodio total a menos de 2300 mg Google. Siempre consulte la informacin nutricional de los alimentos etiquetados como "con bajo contenido de grasa" o "sin grasa".  Estos alimentos pueden tener un mayor contenido de International aid/development worker agregada o carbohidratos refinados, y deben evitarse. Hable con su nutricionista para identificar sus objetivos diarios en  cuanto a los nutrientes mencionados en la etiqueta. Al ir de compras Evite comprar alimentos procesados, enlatados o precocidos. Estos alimentos tienden a Counselling psychologist mayor cantidad de [place], sodio y azcar agregada. Compre en la zona exterior de la tienda de comestibles. Esta es la zona donde se encuentran con mayor frecuencia las frutas y las verduras frescas, los cereales a granel, las carnes frescas y los productos lcteos frescos. Al cocinar Use mtodos de coccin a baja temperatura, como hornear, en lugar de mtodos de coccin a alta temperatura, como frer en abundante aceite. Cocine con aceites saludables, como el aceite de [place], canola o [place]. Evite cocinar con manteca, crema o carnes con alto contenido de grasa. Planificacin de las comidas Coma las comidas y los refrigerios regularmente, preferentemente a la misma hora todos [place]. Evite pasar largos perodos de tiempo sin comer. Consuma alimentos ricos en fibra, como frutas frescas, verduras, frijoles y cereales integrales. Consuma entre 4 y 6 onzas (entre 112 y 168 g) de protenas magras por da, como carnes [place], pollo, pescado, huevos o tofu. Una onza (oz) (28 g) de protena magra equivale a: 1 onza (28 g) de carne, pollo o pescado. 1 huevo.  taza (62 g) de tofu. Coma algunos alimentos por da que contengan grasas saludables, como aguacates, frutos secos, semillas y pescado. Qu alimentos debo comer? Nils Pyle Bayas. Manzanas. Naranjas. Duraznos. Damascos. Ciruelas. Uvas. Mangos. Papayas. Granadas. Kiwi. Cerezas. Verduras Verduras de [employer], que incluyen [place], [place], col rizada, acelga, hojas de berza, hojas de mostaza y repollo. Remolachas. Coliflor. Brcoli. Zanahorias. Judas verdes. Tomates. Pimientos. Cebollas. Pepinos. Coles de Bruselas. Granos Granos integrales, como panes, galletas, tortillas, cereales y pastas de salvado o integrales. Avena sin azcar. Quinua. Arroz integral o salvaje. Carnes y otras  protenas Frutos de mar. Carne de ave sin piel. Cortes magros de ave y carne de res. Tofu. Frutos secos. Semillas. Lcteos Productos lcteos sin grasa o con bajo contenido de [place], [place], yogur y [place]. Es posible que los productos detallados arriba no constituyan una lista completa de los alimentos y las bebidas que puede tomar. Consulte a un nutricionista para obtener ms informacin. Qu alimentos debo evitar? Nils Pyle Frutas enlatadas al almbar. Verduras Verduras enlatadas. Verduras congeladas con mantequilla o salsa de crema. Granos Productos elaborados con [place] y [place], como panes, pastas, bocadillos y cereales. Evite todos los alimentos procesados. Carnes y [address] de carne con alto contenido de Holiday representative. Carne de ave con piel. Carnes empanizadas o fritas. Carne procesada. Evite las grasas saturadas. Lcteos Yogur, queso o [employer]. Bebidas Bebidas azucaradas, como gaseosas o t helado. Es posible que los productos que se enumeran ms [place] no constituyan una lista completa de los alimentos y las bebidas que Personnel officer. Consulte a un nutricionista para obtener ms informacin. Preguntas para hacerle al mdico Debo consultar con un especialista certificado en atencin y educacin sobre la diabetes? Es necesario que me rena con un nutricionista? A qu nmero puedo llamar si tengo preguntas? Cules son los mejores momentos para controlar la glucemia? Dnde encontrar ms informacin: American Diabetes Association (Asociacin Estadounidense de la Diabetes): diabetes.org Academy of Nutrition and Dietetics (Academia de Nutricin y Pension scheme manager): eatright.Dana Corporation of Diabetes and Digestive and Kidney Diseases [employer] de la Diabetes y las Enfermedades Digestivas y Renales): StageSync.si Association of Diabetes  Care & Education Specialists (Asociacin de Especialistas en Atencin y Francella Solian la Diabetes):  diabeteseducator.org Resumen Es importante tener hbitos alimenticios saludables debido a que sus niveles de Psychologist, counselling sangre (glucosa) se ven afectados en gran medida por lo que come y bebe. Es importante consumir alcohol con prudencia. Un plan de comidas saludable lo ayudar a controlar la glucosa en sangre y a reducir el riesgo de enfermedades cardacas. El mdico puede recomendarle que trabaje con un nutricionista para elaborar el mejor plan para usted. Esta informacin no tiene Theme park manager el consejo del mdico. Asegrese de hacerle al mdico cualquier pregunta que tenga. Document Revised: 01/24/2020 Document Reviewed: 01/24/2020 Elsevier Patient Education  2024 [employer].

## 2023-10-05 NOTE — Progress Notes (Signed)
 Jackie Wells 53 y.o.   Chief Complaint  Patient presents with   Follow-up    3 month f/u for DM. Patient mentions having her teeth pulled 2 weeks ago, and wanted to know if she could have a refill for the 800mg  ibuprofen  and also states she has a bubble at the top of her gums she wants looked at.    HISTORY OF PRESENT ILLNESS: This is a 53 y.o. female here for 45-month follow-up of diabetes, hypertension and dyslipidemia. Recent dental work about 2 weeks ago.  Possible post procedure infection.  Took antibiotics for 3 days, given by coworker Was also using ibuprofen  800 mg with relief. No other complaints or medical concerns today.  Lab Results  Component Value Date   HGBA1C 8.8 (A) 06/28/2023   BP Readings from Last 3 Encounters:  06/28/23 118/78  03/11/23 126/88  12/08/22 130/74   Wt Readings from Last 3 Encounters:  06/28/23 220 lb (99.8 kg)  03/11/23 221 lb 8 oz (100.5 kg)  12/08/22 222 lb 8 oz (100.9 kg)     HPI   Prior to Admission medications   Medication Sig Start Date End Date Taking? Authorizing Provider  atorvastatin  (LIPITOR) 40 MG tablet TAKE 1 TABLET BY MOUTH EVERY DAY 08/02/23  Yes Leeam Cedrone, Isidro Margo, MD  blood glucose meter kit and supplies KIT Per insurance preference. Check blood glucose once a day. E11.9 03/14/19  Yes Elyce Hams, Irma M, MD  Dulaglutide  (TRULICITY ) 3 MG/0.5ML SOPN Inject 3 mg as directed once a week. 03/11/23  Yes Bazil Dhanani, Isidro Margo, MD  FARXIGA  10 MG TABS tablet TAKE 1 TABLET BY MOUTH ONCE DAILY BEFORE BREAKFAST 05/15/23  Yes Brianni Manthe, Isidro Margo, MD  ibuprofen  (ADVIL ) 800 MG tablet Take 800 mg by mouth every 8 (eight) hours as needed for mild pain (pain score 1-3).   Yes [provider]  lisinopril -hydrochlorothiazide  (ZESTORETIC ) 10-12.5 MG tablet Take 1 tablet by mouth daily. 08/10/23  Yes Dare Spillman, Isidro Margo, MD  metFORMIN  (GLUCOPHAGE ) 500 MG tablet TAKE 1 TABLET BY MOUTH TWICE DAILY WITH A MEAL 08/23/23   Yes Elvira Hammersmith, MD    No Known Allergies  Patient Active Problem List   Diagnosis Date Noted   Adenomatous polyp of colon 05/05/2022   Internal hemorrhoids 05/05/2022   Varicose veins of left lower extremity with complications 08/13/2015   Diabetes type 2, uncontrolled 11/09/2014   Morbid obesity (HCC) 08/04/2014   Dyslipidemia associated with type 2 diabetes mellitus (HCC) 08/04/2014   Hypertension associated with diabetes (HCC) 08/04/2014    Past Medical History:  Diagnosis Date   Anemia    Diabetes mellitus without complication (HCC)    on meds   GERD (gastroesophageal reflux disease)    with certain foods   Hyperlipidemia    Hypertension    PONV (postoperative nausea and vomiting)     Past Surgical History:  Procedure Laterality Date   CESAREAN SECTION  1994   2008   LAPAROSCOPIC UNILATERAL SALPINGO OOPHERECTOMY  1993   TONSILLECTOMY  1984    Social History   Socioeconomic History   Marital status: Single    Spouse name: Not on file   Number of children: Not on file   Years of education: Not on file   Highest education level: Not on file  Occupational History   Not on file  Tobacco Use   Smoking status: Never   Smokeless tobacco: Never  Vaping Use   Vaping status: Never Used  Substance and  Sexual Activity   Alcohol use: No    Alcohol/week: 0.0 standard drinks of alcohol   Drug use: No   Sexual activity: Yes    Birth control/protection: Post-menopausal  Other Topics Concern   Not on file  Social History Narrative   Not on file   Social Drivers of Health   Financial Resource Strain: Not on file  Food Insecurity: Not on file  Transportation Needs: Not on file  Physical Activity: Not on file  Stress: Not on file  Social Connections: Not on file  Intimate Partner Violence: Not on file    Family History  Problem Relation Age of Onset   Colon polyps Mother    Hyperlipidemia Mother    Hypertension Mother    Hypertension Father     Colon cancer Neg Hx    Esophageal cancer Neg Hx    Rectal cancer Neg Hx    Stomach cancer Neg Hx      Review of Systems  Constitutional: Negative.  Negative for chills and fever.  HENT: Negative.  Negative for congestion and sore throat.   Respiratory: Negative.  Negative for cough and shortness of breath.   Cardiovascular: Negative.  Negative for chest pain and palpitations.  Gastrointestinal:  Negative for abdominal pain, diarrhea, nausea and vomiting.  Genitourinary: Negative.  Negative for dysuria and hematuria.  Skin: Negative.  Negative for rash.  Neurological: Negative.  Negative for dizziness and headaches.  All other systems reviewed and are negative.   Today's Vitals   10/05/23 0813  BP: 114/84  Pulse: 85  Temp: 98.2 F (36.8 C)  TempSrc: Oral  SpO2: 97%  Weight: 224 lb (101.6 kg)  Height: 5\' 5"  (1.651 m)   Body mass index is 37.28 kg/m.   Physical Exam Vitals reviewed.  Constitutional:      Appearance: Normal appearance.  HENT:     Head: Normocephalic.     Mouth/Throat:     Mouth: Mucous membranes are moist.     Pharynx: Oropharynx is clear.  Eyes:     Extraocular Movements: Extraocular movements intact.     Pupils: Pupils are equal, round, and reactive to light.  Cardiovascular:     Rate and Rhythm: Normal rate and regular rhythm.  Pulmonary:     Effort: Pulmonary effort is normal.     Breath sounds: Normal breath sounds.  Skin:    General: Skin is warm and dry.  Neurological:     Mental Status: She is alert and oriented to person, place, and time.  Psychiatric:        Mood and Affect: Mood normal.        Behavior: Behavior normal.    Results for orders placed or performed in visit on 10/05/23 (from the past 24 hours)  POCT glycosylated hemoglobin (Hb A1C)     Status: Abnormal   Collection Time: 10/05/23  8:32 AM  Result Value Ref Range   Hemoglobin A1C 9.1 (A) 4.0 - 5.6 %   HbA1c POC (<> result, manual entry)     HbA1c, POC (prediabetic  range)     HbA1c, POC (controlled diabetic range)       ASSESSMENT & PLAN: A total of 45 minutes was spent with the patient and counseling/coordination of care regarding preparing for this visit, review of most recent office visit notes, review of multiple chronic medical conditions and their management, cardiovascular risks associated with uncontrolled diabetes, review of all medications, review of most recent bloodwork results including interpretation of today's  hemoglobin A1c, review of health maintenance items, education on nutrition, prognosis, documentation, and need for follow up.   Problem List Items Addressed This Visit       Cardiovascular and Mediastinum   Hypertension associated with diabetes (HCC) - Primary   Well-controlled hyper tension Continue Zestoretic  10-12.5 mg daily Hemoglobin A1c at 9.1, still not at goal Cardiovascular risks associated with uncontrolled diabetes discussed Diet and nutrition discussed Recommend to continue metformin  500 mg twice a day and Farxiga  10 mg daily Continue Trulicity  dose to 3 mg weekly We will follow-up in 3 months Recommend referral to diabetic nutritional services      Relevant Orders   POCT glycosylated hemoglobin (Hb A1C) (Completed)   Microalbumin / creatinine urine ratio   CBC with Differential/Platelet   Comprehensive metabolic panel with GFR   Lipid panel   Ambulatory referral to diabetic education     Endocrine   Dyslipidemia associated with type 2 diabetes mellitus (HCC)   Hemoglobin A1c still not at goal at 9.1 Diet and nutrition discussed We will continue metformin  and Farxiga  and weekly Trulicity  Continue atorvastatin  40 mg daily Follow-up in 3 months      Relevant Orders   POCT glycosylated hemoglobin (Hb A1C) (Completed)   Microalbumin / creatinine urine ratio   CBC with Differential/Platelet   Comprehensive metabolic panel with GFR   Lipid panel   Ambulatory referral to diabetic education      Genitourinary   Stage 3a chronic kidney disease (HCC)   Chronic stable condition. Blood work done today Recommend to stay well-hydrated and avoid NSAIDs is much as possible Continue Farxiga  10 mg daily        Other   Morbid obesity (HCC)   Cardiovascular risks associated with obesity discussed Diet and nutrition discussed Benefits of exercise discussed Advised to decrease amount of daily carbohydrate intake and daily calories and increase amount of plant-based protein in her diet We will follow-up in 3 months Refer to diabetic nutritional services      Relevant Orders   POCT glycosylated hemoglobin (Hb A1C) (Completed)   Microalbumin / creatinine urine ratio   CBC with Differential/Platelet   Comprehensive metabolic panel with GFR   Lipid panel   Ambulatory referral to diabetic education   Other Visit Diagnoses       Pain, dental       Relevant Medications   ibuprofen  (ADVIL ) 800 MG tablet   amoxicillin (AMOXIL) 500 MG capsule      Patient Instructions  Diabetes mellitus y nutricin, en adultos Diabetes Mellitus and Nutrition, Adult Si sufre de diabetes, o diabetes mellitus, es muy importante tener hbitos alimenticios saludables debido a que sus niveles de Psychologist, counselling sangre (glucosa) se ven afectados en gran medida por lo que come y bebe. [place] alimentos saludables en las cantidades correctas, aproximadamente a la misma hora todos los [place], [place] ayudar a: Chief Operating Officer su glucemia. Disminuir el riesgo de sufrir una enfermedad cardaca. Mejorar la presin arterial. Barista o mantener un peso saludable. Qu puede afectar mi plan de alimentacin? Todas las personas que sufren de diabetes son diferentes y cada una tiene necesidades diferentes en cuanto a un plan de alimentacin. El mdico puede recomendarle que trabaje con un nutricionista para elaborar el mejor plan para usted. Su plan de alimentacin puede variar segn factores como: Las caloras que necesita. Los  medicamentos que toma. Su peso. Sus niveles de glucemia, presin arterial y colesterol. Su nivel de [place]. Otras afecciones que tenga, como enfermedades cardacas  o renales. Cmo me afectan los carbohidratos? Los carbohidratos, o hidratos de carbono, afectan su nivel de glucemia ms que cualquier otro tipo de alimento. La ingesta de carbohidratos aumenta la cantidad de [employer]. Es importante conocer la cantidad de carbohidratos que se pueden ingerir en cada comida sin correr Surveyor, minerals. Esto es Government social research officer. Su nutricionista puede ayudarlo a calcular la cantidad de carbohidratos que debe ingerir en cada comida y en cada refrigerio. Cmo me afecta el alcohol? El alcohol puede provocar una disminucin de la glucemia (hipoglucemia), especialmente si usa  insulina o toma determinados medicamentos por va oral para la diabetes. La hipoglucemia es una afeccin potencialmente mortal. Los sntomas de la hipoglucemia, como somnolencia, mareos y confusin, son similares a los sntomas de haber consumido demasiado alcohol. No beba alcohol si: Su mdico le indica no hacerlo. Est embarazada, puede estar embarazada o est tratando de quedar embarazada. Si bebe alcohol: Limite la cantidad que bebe a lo siguiente: De 0 a 1 medida por da para las mujeres. De 0 a 2 medidas por da para los hombres. Sepa cunta cantidad de alcohol hay en las bebidas que toma. En los [address], una medida equivale a una botella de cerveza de 12 oz (355 ml), un vaso de vino de 5 oz (148 ml) o un vaso de una bebida alcohlica de alta graduacin de 1 oz (44 ml). Mantngase hidratado bebiendo agua, refrescos dietticos o t helado sin azcar. Tenga en cuenta que los refrescos comunes, los jugos y otras bebidas para mezclar pueden contener Product/process development scientist y se deben contar como carbohidratos. Consejos para seguir Social worker las etiquetas de los alimentos Comience por leer el tamao de la  porcin en la etiqueta de Informacin nutricional de los alimentos envasados y las bebidas. La cantidad de caloras, carbohidratos, grasas y otros nutrientes detallados en la etiqueta se basan en una porcin del alimento. Muchos alimentos contienen ms de una porcin por envase. Verifique la cantidad total de gramos (g) de carbohidratos totales en una porcin. Verifique la cantidad de gramos de grasas saturadas y grasas trans en una porcin. Escoja alimentos que no contengan estas grasas o que su contenido de estas sea [place]. Verifique la cantidad de miligramos (mg) de sal (sodio) en una porcin. La [employer] de las personas deben limitar la ingesta de sodio total a menos de 2300 mg Google. Siempre consulte la informacin nutricional de los alimentos etiquetados como "con bajo contenido de grasa" o "sin grasa". Estos alimentos pueden tener un mayor contenido de International aid/development worker agregada o carbohidratos refinados, y deben evitarse. Hable con su nutricionista para identificar sus objetivos diarios en cuanto a los nutrientes mencionados en la etiqueta. Al ir de compras Evite comprar alimentos procesados, enlatados o precocidos. Estos alimentos tienden a tener una mayor cantidad de grasa, sodio y azcar agregada. Compre en la zona exterior de la tienda de comestibles. Esta es la zona donde se encuentran con mayor frecuencia las frutas y las verduras frescas, los cereales a granel, las carnes frescas y los productos lcteos frescos. Al cocinar Use mtodos de coccin a baja temperatura, como hornear, en lugar de mtodos de coccin a alta temperatura, como frer en abundante aceite. Cocine con aceites saludables, como el aceite de oliva, canola o girasol. Evite cocinar con manteca, crema o carnes con alto contenido de grasa. Planificacin de las comidas Coma las comidas y los refrigerios regularmente, preferentemente a la misma hora todos [place]. Evite pasar largos perodos de  tiempo sin comer. Consuma alimentos ricos  en fibra, como frutas frescas, verduras, frijoles y cereales integrales. Consuma entre 4 y 6 onzas (entre 112 y 168 g) de protenas magras por da, como carnes [place], pollo, pescado, huevos o tofu. Una onza (oz) (28 g) de protena magra equivale a: 1 onza (28 g) de carne, pollo o pescado. 1 huevo.  taza (62 g) de tofu. Coma algunos alimentos por da que contengan grasas saludables, como aguacates, frutos secos, semillas y pescado. Qu alimentos debo comer? [place]. Manzanas. Naranjas. Duraznos. Damascos. Ciruelas. Uvas. Mangos. Papayas. Granadas. Kiwi. Cerezas. Verduras Verduras de [employer], que incluyen [place], espinaca, col rizada, acelga, hojas de berza, hojas de mostaza y repollo. Remolachas. Coliflor. Brcoli. Zanahorias. Judas verdes. Tomates. Pimientos. Cebollas. Pepinos. Coles de Bruselas. Granos Granos integrales, como panes, galletas, tortillas, cereales y pastas de salvado o integrales. Avena sin azcar. Quinua. Arroz integral o salvaje. Carnes y otras protenas Frutos de mar. Carne de ave sin piel. Cortes magros de ave y carne de res. Tofu. Frutos secos. Semillas. Lcteos Productos lcteos sin grasa o con bajo contenido de grasa, como leche, yogur y [place]. Es posible que los productos detallados arriba no constituyan una lista completa de los alimentos y las bebidas que puede tomar. Consulte a un nutricionista para obtener ms informacin. Qu alimentos debo evitar? [place]ion Frutas enlatadas al almbar. Verduras Verduras enlatadas. Verduras congeladas con mantequilla o salsa de crema. Granos Productos elaborados con [place] y [place], como panes, pastas, bocadillos y cereales. Evite todos los alimentos procesados. Carnes y [address] de carne con alto contenido de Holiday representative. Carne de ave con piel. Carnes empanizadas o fritas. Carne procesada. Evite las grasas saturadas. Lcteos Yogur, queso o [employer] Health. Bebidas Bebidas azucaradas, como  gaseosas o t helado. Es posible que los productos que se enumeran ms [place] no constituyan una lista completa de los alimentos y las bebidas que Personnel officer. Consulte a un nutricionista para obtener ms informacin. Preguntas para hacerle al mdico Debo consultar con un especialista certificado en atencin y educacin sobre la diabetes? Es necesario que me rena con un nutricionista? A qu nmero puedo llamar si tengo preguntas? Cules son los mejores momentos para controlar la glucemia? Dnde encontrar ms informacin: American Diabetes Association (Asociacin Estadounidense de la Diabetes): diabetes.org Academy of Nutrition and Dietetics (Academia de Nutricin y Diettica): eatright.Dana Corporation of Diabetes and Digestive and Kidney Diseases [employer] de la Diabetes y las Enfermedades Digestivas y Renales): StageSync.si Association of Diabetes Care & Education Specialists (Asociacin de Especialistas en Atencin y Educacin sobre la Diabetes): diabeteseducator.org Resumen Es importante tener hbitos alimenticios saludables debido a que sus niveles de Psychologist, counselling sangre (glucosa) se ven afectados en gran medida por lo que come y bebe. Es importante consumir alcohol con prudencia. Un plan de comidas saludable lo ayudar a controlar la glucosa en sangre y a reducir el riesgo de enfermedades cardacas. El mdico puede recomendarle que trabaje con un nutricionista para elaborar el mejor plan para usted. Esta informacin no tiene Theme park manager el consejo del mdico. Asegrese de hacerle al mdico cualquier pregunta que tenga. Document Revised: 01/24/2020 Document Reviewed: 01/24/2020 Elsevier Patient Education  2024 Elsevier Inc.    Maryagnes Small, MD [facility]

## 2023-10-05 NOTE — Assessment & Plan Note (Signed)
 Well-controlled hyper tension Continue Zestoretic  10-12.5 mg daily Hemoglobin A1c at 9.1, still not at goal Cardiovascular risks associated with uncontrolled diabetes discussed Diet and nutrition discussed Recommend to continue metformin  500 mg twice a day and Farxiga  10 mg daily Continue Trulicity  dose to 3 mg weekly We will follow-up in 3 months Recommend referral to diabetic nutritional services

## 2023-10-05 NOTE — Assessment & Plan Note (Signed)
 Hemoglobin A1c still not at goal at 9.1 Diet and nutrition discussed We will continue metformin  and Farxiga  and weekly Trulicity  Continue atorvastatin  40 mg daily Follow-up in 3 months

## 2023-10-05 NOTE — Assessment & Plan Note (Signed)
 Chronic stable condition. Blood work done today Recommend to stay well-hydrated and avoid NSAIDs is much as possible Continue Farxiga  10 mg daily

## 2023-11-13 ENCOUNTER — Other Ambulatory Visit: Payer: Self-pay | Admitting: Emergency Medicine

## 2023-11-13 DIAGNOSIS — I152 Hypertension secondary to endocrine disorders: Secondary | ICD-10-CM

## 2023-11-13 DIAGNOSIS — E1169 Type 2 diabetes mellitus with other specified complication: Secondary | ICD-10-CM

## 2023-12-08 ENCOUNTER — Encounter (HOSPITAL_COMMUNITY): Payer: Self-pay

## 2023-12-08 ENCOUNTER — Ambulatory Visit (HOSPITAL_COMMUNITY)
Admission: EM | Admit: 2023-12-08 | Discharge: 2023-12-08 | Disposition: A | Discharge status: Home / Self Care | Attending: Emergency Medicine | Admitting: Emergency Medicine

## 2023-12-08 ENCOUNTER — Encounter: Attending: Emergency Medicine | Admitting: Skilled Nursing Facility1

## 2023-12-08 ENCOUNTER — Encounter: Payer: Self-pay | Admitting: Skilled Nursing Facility1

## 2023-12-08 VITALS — Ht 65.0 in | Wt 224.0 lb

## 2023-12-08 DIAGNOSIS — U071 COVID-19: Secondary | ICD-10-CM | POA: Diagnosis not present

## 2023-12-08 DIAGNOSIS — E119 Type 2 diabetes mellitus without complications: Secondary | ICD-10-CM | POA: Diagnosis present

## 2023-12-08 DIAGNOSIS — R051 Acute cough: Secondary | ICD-10-CM

## 2023-12-08 DIAGNOSIS — N1831 Chronic kidney disease, stage 3a: Secondary | ICD-10-CM | POA: Insufficient documentation

## 2023-12-08 LAB — POC SARS CORONAVIRUS 2 AG -  ED: SARS Coronavirus 2 Ag: POSITIVE — AB

## 2023-12-08 MED ORDER — AZELASTINE HCL 0.1 % NA SOLN: 2.0000 | Freq: Two times a day (BID) | NASAL | 0 refills | Status: AC

## 2023-12-08 MED ORDER — BENZONATATE 100 MG PO CAPS: 100.0000 mg | ORAL_CAPSULE | Freq: Three times a day (TID) | ORAL | 0 refills | Status: AC

## 2023-12-08 NOTE — ED Triage Notes (Signed)
 Pt c/o itchy eyes, nasal congestion, and sore throat since last night. States had covid exposure at work. Denies taken any meds.

## 2023-12-08 NOTE — Discharge Instructions (Addendum)
 Your COVID test was positive today. You can take Tessalon  every 8 hours as needed for cough. Use azelastine  spray twice daily as needed for congestion. Take 650 mg of Tylenol every 6-8 hours as needed for body aches, sore throat, and fever. Make sure you are staying hydrated and getting plenty of rest. Return here if your symptoms persist or worsen for reevaluation. If you develop severe shortness of breath, chest pain, or fevers unrelieved by medication please seek immediate medical treatment in the emergency department.  Su prueba de COVID-19 dio positiva hoy. Puede tomar Tessalon  cada 8 horas segn sea necesario para la tos. Use azelastina en aerosol dos veces al da segn sea necesario para la congestin. Tome 650 mg de Tylenol cada 6-8 horas segn sea necesario para dolores corporales, dolor de garganta y [place]. Asegrese de mantenerse hidratado y descansar lo suficiente. Regrese aqu si sus sntomas persisten o empeoran para una reevaluacin. Si presenta dificultad para respirar grave, dolor en el pecho o fiebre que no se alivia con medicamentos, busque atencin mdica inmediata en el servicio de urgencias.

## 2023-12-08 NOTE — ED Provider Notes (Signed)
 [facility]    CSN: [id number] Arrival date & time: 12/08/23  1126      History   Chief Complaint Chief Complaint  Patient presents with   Nasal Congestion    HPI Jackie Wells is a 53 y.o. female.   Patient presents with nasal congestion, sore throat, cough, and bilateral itchy eyes that began last night.  Denies fever, body aches, chills, chest pain, shortness of breath, nausea, vomiting, diarrhea, and abdominal pain.   Patient states that she had a recent exposure to COVID while at work.  Patient denies taking any medications for symptoms.  Patient denies history of asthma or COPD.  Of note patient does have a history of diabetes, hyperlipidemia, hypertension, and stage III CKD.  The history is provided by the patient and medical records. The history is limited by a language barrier. No language interpreter was used Garment/textile technologist declined, daughter interpreting).    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/13/2015   Diabetes type 2, uncontrolled 11/09/2014   Morbid obesity (HCC) 08/04/2014   Dyslipidemia associated with type 2 diabetes mellitus (HCC) 08/04/2014   Hypertension associated with diabetes (HCC) 08/04/2014    Past Surgical History:  Procedure Laterality Date   CESAREAN SECTION  1994   2008   LAPAROSCOPIC UNILATERAL SALPINGO OOPHERECTOMY  1993   TONSILLECTOMY  1984    OB History     Gravida  2   Para  2   Term  2   Preterm      AB      Living  2      SAB      IAB       Ectopic      Multiple      Live Births  2            Home Medications    Prior to Admission medications   Medication Sig Start Date End Date Taking? Authorizing Provider  azelastine  (ASTELIN ) 0.1 % nasal spray Place 2 sprays into both nostrils 2 (two) times daily. Use in each nostril as directed 12/08/23  Yes Johnie Flaming A, NP  benzonatate  (TESSALON ) 100 MG capsule Take 1 capsule (100 mg total) by mouth every 8 (eight) hours. 12/08/23  Yes Johnie Flaming A, NP  atorvastatin  (LIPITOR) 40 MG tablet TAKE 1 TABLET BY MOUTH EVERY DAY 08/02/23   Purcell Emil Schanz, MD  blood glucose meter kit and supplies KIT Per insurance preference. Check blood glucose once a day. E11.9 03/14/19   Melonie Colonel, Mikel HERO, MD  Dulaglutide  (TRULICITY ) 3 MG/0.5ML SOAJ INJECT 3 MG AS DIRECTED ONCE A WEEK 11/13/23   Purcell Emil Schanz, MD  FARXIGA  10 MG TABS tablet TAKE 1 TABLET BY MOUTH ONCE DAILY BEFORE BREAKFAST 05/15/23   Purcell Emil Schanz, MD  ibuprofen  (ADVIL ) 800 MG tablet Take 1 tablet (800 mg total) by mouth every 8 (eight) hours as needed for mild pain (pain score 1-3). 10/05/23   Purcell Emil Schanz, MD  lisinopril -hydrochlorothiazide  (ZESTORETIC ) 10-12.5 MG tablet Take 1 tablet by mouth daily. 08/10/23   Purcell Emil Schanz, MD  metFORMIN  (GLUCOPHAGE ) 500 MG tablet TAKE 1 TABLET BY MOUTH TWICE DAILY WITH A MEAL 08/23/23   Sagardia, Emil Schanz, MD    Family History Family History  Problem Relation Age of Onset   Colon polyps Mother    Hyperlipidemia Mother    Hypertension Mother    Hypertension Father    Colon cancer Neg Hx    Esophageal cancer Neg Hx    Rectal cancer Neg Hx    Stomach cancer Neg Hx     Social History Social History   Tobacco Use   Smoking status: Never   Smokeless tobacco: Never  Vaping Use   Vaping status: Never Used  Substance Use Topics   Alcohol use: No    Alcohol/week: 0.0 standard drinks of alcohol   Drug use: No     Allergies   Patient has  no known allergies.   Review of Systems Review of Systems  Per HPI  Physical Exam Triage Vital Signs ED Triage Vitals [12/08/23 1250]  Encounter Vitals Group     BP 116/74     Girls Systolic BP Percentile      Girls Diastolic BP Percentile      Boys Systolic BP Percentile      Boys Diastolic BP Percentile      Pulse Rate 99     Resp 18     Temp 98.5 F (36.9 C)     Temp Source Oral     SpO2 95 %     Weight      Height      Head Circumference      Peak Flow      Pain Score 0     Pain Loc      Pain Education      Exclude from Growth Chart    No data found.  Updated Vital Signs BP 116/74 (BP Location: Right Arm)   Pulse 99   Temp 98.5 F (36.9 C) (Oral)   Resp 18   LMP  (LMP Unknown)   SpO2 95%   Visual Acuity Right Eye Distance:   Left Eye Distance:   Bilateral Distance:    Right Eye Near:   Left Eye Near:    Bilateral Near:     Physical Exam Vitals and nursing note reviewed.  Constitutional:      General: She is awake. She is not in acute distress.    Appearance: Normal appearance. She is well-developed and well-groomed. She is not ill-appearing.  HENT:     Right Ear: Tympanic membrane, ear canal and external ear normal.     Left Ear: Tympanic membrane, ear canal and external ear normal.     Nose: Congestion and rhinorrhea present.     Mouth/Throat:     Mouth: Mucous membranes are moist.     Pharynx: Posterior oropharyngeal erythema and postnasal drip present. No oropharyngeal exudate.  Cardiovascular:     Rate and Rhythm: Normal rate and regular rhythm.  Pulmonary:     Effort: Pulmonary effort is normal.     Breath sounds: Normal breath sounds.  Skin:    General: Skin is warm and dry.  Neurological:     Mental Status: She is alert.  Psychiatric:        Behavior: Behavior is cooperative.      UC Treatments / Results  Labs (all labs ordered are listed, but only  abnormal results are displayed) Labs Reviewed  POC SARS CORONAVIRUS 2 AG -   ED - Abnormal; Notable for the following components:      Result Value   SARS Coronavirus 2 Ag Positive (*)    All other components within normal limits    EKG   Radiology No results found.  Procedures Procedures (including critical care time)  Medications Ordered in UC Medications - No data to display  Initial Impression / Assessment and Plan / UC Course  I have reviewed the triage vital signs and the nursing notes.  Pertinent labs & imaging results that were available during my care of the patient were reviewed by me and considered in my medical decision making (see chart for details).     Patient is overall well-appearing.  Vitals are stable.  Upon assessment mild congestion and rhinorrhea are present, mild erythema and PND noted to pharynx.  Lungs clear bilaterally to auscultation.  No other significant findings upon exam.  COVID testing positive.  Discussed risks and benefits related to Paxlovid.  Patient declined prescription for Paxlovid at this time.  Prescribed Tessalon  as needed for cough.  Prescribed azelastine  to help with congestion.  Discussed over-the-counter medications for symptoms.  Discussed follow-up, return, and strict ER precautions. Final Clinical Impressions(s) / UC Diagnoses   Final diagnoses:  COVID-19  Acute cough     Discharge Instructions      Your COVID test was positive today. You can take Tessalon  every 8 hours as needed for cough. Use azelastine  spray twice daily as needed for congestion. Take 650 mg of Tylenol every 6-8 hours as needed for body aches, sore throat, and fever. Make sure you are staying hydrated and getting plenty of rest. Return here if your symptoms persist or worsen for reevaluation. If you develop severe shortness of breath, chest pain, or fevers unrelieved by medication please seek immediate medical treatment in the emergency department.  Su prueba de COVID-19 dio positiva hoy. Puede tomar Tessalon  cada 8 horas segn  sea necesario para la tos. Use azelastina en aerosol dos veces al da segn sea necesario para la congestin. Tome 650 mg de Tylenol cada 6-8 horas segn sea necesario para dolores corporales, dolor de garganta y [place]. Asegrese de mantenerse hidratado y descansar lo suficiente. Regrese aqu si sus sntomas persisten o empeoran para una reevaluacin. Si presenta dificultad para respirar grave, dolor en el pecho o fiebre que no se alivia con medicamentos, busque atencin mdica inmediata en el servicio de urgencias.   ED Prescriptions     Medication Sig Dispense Auth. Provider   benzonatate  (TESSALON ) 100 MG capsule Take 1 capsule (100 mg total) by mouth every 8 (eight) hours. 21 capsule Johnie, Marlynn Hinckley A, NP   azelastine  (ASTELIN ) 0.1 % nasal spray Place 2 sprays into both nostrils 2 (two) times daily. Use in each nostril as directed 30 mL Johnie Flaming A, NP      PDMP not reviewed this encounter.   Johnie Flaming A, NP 12/08/23 [phone]

## 2023-12-08 NOTE — Progress Notes (Unsigned)
 A1C 9.1  DM medications: Trulicity  Metformin  Farxiga     24 hr recall: fast food once a week First meal: skipped or burrito: egg + chorizo Snack: Second meal: sandwich or chicken soup + zucchini or meatball soup with vegetable or tuna with tomato and onion Snack: Third meal: fast food  Beverages: coffee + milk + splenda, sweet tea, soda   Pt states she enjoys food and does emotionally eat. Pt states she lives with her daughter. Pt state she does not eat a lot of non starchy vegetables and neither does her daughter.   Diabetes Self-Management Education  Visit Type: Follow-up  Appt. Start Time: 10:00 Appt. End Time: 11:00  12/09/2023  Ms. Brailee Lindsi Bayliss, identified by name and date of birth, is a 53 y.o. female with a diagnosis of Diabetes:  .   ASSESSMENT  Height 5' 5 (1.651 m), weight 224 lb (101.6 kg). Body mass index is 37.28 kg/m.   Diabetes Self-Management Education - 12/08/23 1107       Visit Information   Visit Type Follow-up      Health Coping   How would you rate your overall health? Good      Psychosocial Assessment   Patient Belief/Attitude about Diabetes Denial    What is the hardest part about your diabetes right now, causing you the most concern, or is the most worrisome to you about your diabetes?   Making healty food and beverage choices    Self-care barriers English as a second language    Self-management support Family    Other persons present Family Member    Patient Concerns Nutrition/Meal planning;Glycemic Control    Special Needs None    Preferred Learning Style Visual;Auditory;Hands on    Learning Readiness Not Ready    How often do you need to have someone help you when you read instructions, pamphlets, or other written materials from your doctor or pharmacy? 5 - Always      Pre-Education Assessment   Patient understands the diabetes disease and treatment process. Needs Review    Patient understands incorporating nutritional  management into lifestyle. Needs Review    Patient undertands incorporating physical activity into lifestyle. Needs Review    Patient understands using medications safely. Needs Review    Patient understands monitoring blood glucose, interpreting and using results Needs Review    Patient understands prevention, detection, and treatment of acute complications. Needs Review    Patient understands prevention, detection, and treatment of chronic complications. Needs Review    Patient understands how to develop strategies to address psychosocial issues. Needs Review    Patient understands how to develop strategies to promote health/change behavior. Needs Review      Complications   Last HgB A1C per patient/outside source 9.1 %    How often do you check your blood sugar? 0 times/day (not testing)    Have you had a dilated eye exam in the past 12 months? Yes    Have you had a dental exam in the past 12 months? Yes    Are you checking your feet? Yes    How many days per week are you checking your feet? 7      Patient Education   Previous Diabetes Education No    Disease Pathophysiology Definition of diabetes, type 1 and 2, and the diagnosis of diabetes;Factors that contribute to the development of diabetes    Healthy Eating Plate Method;Carbohydrate counting;Food label reading, portion sizes and measuring food.;Role of diet in the treatment  of diabetes and the relationship between the three main macronutrients and blood glucose level;Reviewed blood glucose goals for pre and post meals and how to evaluate the patients' food intake on their blood glucose level.    Being Active Role of exercise on diabetes management, blood pressure control and cardiac health.    Medications Reviewed patients medication for diabetes, action, purpose, timing of dose and side effects.    Monitoring Taught/evaluated SMBG meter.;Daily foot exams;Yearly dilated eye exam;Identified appropriate SMBG and/or A1C goals.    Acute  complications Taught prevention, symptoms, and  treatment of hypoglycemia - the 15 rule.;Discussed and identified patients' prevention, symptoms, and treatment of hyperglycemia.    Chronic complications Retinopathy and reason for yearly dilated eye exams;Dental care;Nephropathy, what it is, prevention of, the use of ACE, ARB's and early detection of through urine microalbumia.;Lipid levels, blood glucose control and heart disease    Diabetes Stress and Support Role of stress on diabetes    Lifestyle and Health Coping Lifestyle issues that need to be addressed for better diabetes care      Individualized Goals (developed by patient)   Nutrition Follow meal plan discussed;General guidelines for healthy choices and portions discussed    Physical Activity Exercise 5-7 days per week;30 minutes per day    Medications take my medication as prescribed    Monitoring  Test my blood glucose as discussed;Test blood glucose pre and post meals as discussed    Problem Solving Sleep Pattern;Eating Pattern    Reducing Risk do foot checks daily      Patient Self-Evaluation of Goals - Patient rates self as meeting previously set goals (% of time)   Nutrition < 25% (hardly ever/never)    Physical Activity < 25% (hardly ever/never)    Medications >75% (most of the time)    Monitoring < 25% (hardly ever/never)    Problem Solving and behavior change strategies  < 25% (hardly ever/never)    Reducing Risk (treating acute and chronic complications) < 25% (hardly ever/never)    Health Coping < 25% (hardly ever/never)      Post-Education Assessment   Patient understands the diabetes disease and treatment process. Demonstrates understanding / competency    Patient understands incorporating nutritional management into lifestyle. Demonstrates understanding / competency    Patient undertands incorporating physical activity into lifestyle. Demonstrates understanding / competency    Patient understands using medications  safely. Demonstrates understanding / competency    Patient understands monitoring blood glucose, interpreting and using results Demonstrates understanding / competency    Patient understands prevention, detection, and treatment of acute complications. Demonstrates understanding / competency    Patient understands prevention, detection, and treatment of chronic complications. Demonstrates understanding / competency    Patient understands how to develop strategies to address psychosocial issues. Demonstrates understanding / competency    Patient understands how to develop strategies to promote health/change behavior. Demonstrates understanding / competency      Outcomes   Expected Outcomes Demonstrated interest in learning but significant barriers to change    Future DMSE 2 months    Program Status Completed      Subsequent Visit   Since your last visit have you continued or begun to take your medications as prescribed? Yes    Since your last visit have you experienced any weight changes? Gain    Since your last visit, are you checking your blood glucose at least once a day? No          Individualized Plan for  Diabetes Self-Management Training:   Learning Objective:  Patient will have a greater understanding of diabetes self-management. Patient education plan is to attend individual and/or group sessions per assessed needs and concerns.    Expected Outcomes:  Demonstrated interest in learning but significant barriers to change  Education material provided: ADA - How to Thrive: A Guide for Your Journey with Diabetes, Meal plan card, My Plate, and Snack sheet in spanish   If problems or questions, patient to contact team via:  Phone and Email  Future DSME appointment: 2 months

## 2023-12-12 ENCOUNTER — Other Ambulatory Visit: Payer: Self-pay | Admitting: Emergency Medicine

## 2023-12-12 DIAGNOSIS — E1159 Type 2 diabetes mellitus with other circulatory complications: Secondary | ICD-10-CM

## 2024-01-05 ENCOUNTER — Ambulatory Visit: Admitting: Emergency Medicine

## 2024-01-05 ENCOUNTER — Encounter: Payer: Self-pay | Admitting: Emergency Medicine

## 2024-01-05 VITALS — BP 118/82 | HR 72 | Temp 98.0°F | Ht 65.0 in | Wt 221.0 lb

## 2024-01-05 DIAGNOSIS — E785 Hyperlipidemia, unspecified: Secondary | ICD-10-CM

## 2024-01-05 DIAGNOSIS — E1159 Type 2 diabetes mellitus with other circulatory complications: Secondary | ICD-10-CM | POA: Diagnosis not present

## 2024-01-05 DIAGNOSIS — N1831 Chronic kidney disease, stage 3a: Secondary | ICD-10-CM

## 2024-01-05 DIAGNOSIS — I152 Hypertension secondary to endocrine disorders: Secondary | ICD-10-CM

## 2024-01-05 DIAGNOSIS — E1169 Type 2 diabetes mellitus with other specified complication: Secondary | ICD-10-CM | POA: Diagnosis not present

## 2024-01-05 LAB — POCT GLYCOSYLATED HEMOGLOBIN (HGB A1C): Hemoglobin A1C: 8.7 % — AB (ref 4.0–5.6)

## 2024-01-05 NOTE — Assessment & Plan Note (Signed)
 Cardiovascular risks associated with obesity discussed Diet and nutrition discussed Benefits of exercise discussed Advised to decrease amount of daily carbohydrate intake and daily calories and increase amount of plant-based protein in her diet We will follow-up in 3 months

## 2024-01-05 NOTE — Assessment & Plan Note (Signed)
 Well-controlled hyper tension Continue Zestoretic  10-12.5 mg daily Hemoglobin A1c at 8.7, better than before but still not at goal Cardiovascular risks associated with uncontrolled diabetes discussed Diet and nutrition discussed Recommend to continue metformin  500 mg twice a day and Farxiga  10 mg daily Continue Trulicity  dose to 3 mg weekly We will follow-up in 3 months Continue with diabetic nutritional services

## 2024-01-05 NOTE — Patient Instructions (Signed)
 Diabetes mellitus y nutricin, en adultos Diabetes Mellitus and Nutrition, Adult Si sufre de diabetes, o diabetes mellitus, es muy importante tener hbitos alimenticios saludables debido a que sus niveles de Psychologist, counselling sangre (glucosa) se ven afectados en gran medida por lo que come y bebe. [place] alimentos saludables en las cantidades correctas, aproximadamente a la misma hora todos los [place], [place] ayudar a: Chief Operating Officer su glucemia. Disminuir el riesgo de sufrir una enfermedad cardaca. Mejorar la presin arterial. Barista o mantener un peso saludable. Qu puede afectar mi plan de alimentacin? Todas las personas que sufren de diabetes son diferentes y cada una tiene necesidades diferentes en cuanto a un plan de alimentacin. El mdico puede recomendarle que trabaje con un nutricionista para elaborar el mejor plan para usted. Su plan de alimentacin puede variar segn factores como: Las caloras que necesita. Los medicamentos que toma. Su peso. Sus niveles de glucemia, presin arterial y colesterol. Su nivel de [place]. Otras afecciones que tenga, como enfermedades cardacas o renales. Cmo me afectan los carbohidratos? Los carbohidratos, o hidratos de carbono, afectan su nivel de glucemia ms que cualquier otro tipo de alimento. La ingesta de carbohidratos aumenta la cantidad de [employer]. Es importante conocer la cantidad de carbohidratos que se pueden ingerir en cada comida sin correr Surveyor, minerals. Esto es Government social research officer. Su nutricionista puede ayudarlo a calcular la cantidad de carbohidratos que debe ingerir en cada comida y en cada refrigerio. Cmo me afecta el alcohol? El alcohol puede provocar una disminucin de la glucemia (hipoglucemia), especialmente si [place] insulina o toma determinados medicamentos por va oral para la diabetes. La hipoglucemia es una afeccin potencialmente mortal. Los sntomas de la hipoglucemia, como somnolencia, mareos y confusin, son  similares a los sntomas de haber consumido demasiado alcohol. No beba alcohol si: Su mdico le indica no hacerlo. Est embarazada, puede estar embarazada o est tratando de [place]. Si bebe alcohol: Limite la cantidad que bebe a lo siguiente: De 0 a 1 medida por da para las mujeres. De 0 a 2 medidas por da para los hombres. Sepa cunta cantidad de alcohol hay en las bebidas que toma. En los [address], una medida equivale a una botella de cerveza de 12 oz (355 ml), un vaso de vino de 5 oz (148 ml) o un vaso de una bebida alcohlica de alta graduacin de 1 oz (44 ml). Mantngase hidratado bebiendo agua, refrescos dietticos o t helado sin azcar. Tenga en cuenta que los refrescos comunes, los jugos y otras bebidas para mezclar pueden contener Product/process development scientist y se deben contar como carbohidratos. Consejos para seguir Social worker las etiquetas de los alimentos Comience por leer el tamao de la porcin en la etiqueta de Informacin nutricional de los alimentos envasados y las bebidas. La cantidad de caloras, carbohidratos, grasas y otros nutrientes detallados en la etiqueta se basan en una porcin del alimento. Muchos alimentos contienen ms de una porcin por envase. Verifique la cantidad total de gramos (g) de carbohidratos totales en una porcin. Verifique la cantidad de gramos de grasas saturadas y grasas trans en una porcin. Escoja alimentos que no contengan estas grasas o que su contenido de estas sea [place]. Verifique la cantidad de miligramos (mg) de sal (sodio) en una porcin. La [employer] de las personas deben limitar la ingesta de sodio total a menos de 2300 mg Google. Siempre consulte la informacin nutricional de los alimentos etiquetados como "con bajo contenido de grasa" o "sin grasa".  Estos alimentos pueden tener un mayor contenido de International aid/development worker agregada o carbohidratos refinados, y deben evitarse. Hable con su nutricionista para identificar sus objetivos diarios en  cuanto a los nutrientes mencionados en la etiqueta. Al ir de compras Evite comprar alimentos procesados, enlatados o precocidos. Estos alimentos tienden a Counselling psychologist mayor cantidad de [place], sodio y azcar agregada. Compre en la zona exterior de la tienda de comestibles. Esta es la zona donde se encuentran con mayor frecuencia las frutas y las verduras frescas, los cereales a granel, las carnes frescas y los productos lcteos frescos. Al cocinar Use mtodos de coccin a baja temperatura, como hornear, en lugar de mtodos de coccin a alta temperatura, como frer en abundante aceite. Cocine con aceites saludables, como el aceite de [place], canola o [place]. Evite cocinar con manteca, crema o carnes con alto contenido de grasa. Planificacin de las comidas Coma las comidas y los refrigerios regularmente, preferentemente a la misma hora todos [place]. Evite pasar largos perodos de tiempo sin comer. Consuma alimentos ricos en fibra, como frutas frescas, verduras, frijoles y cereales integrales. Consuma entre 4 y 6 onzas (entre 112 y 168 g) de protenas magras por da, como carnes [place], pollo, pescado, huevos o tofu. Una onza (oz) (28 g) de protena magra equivale a: 1 onza (28 g) de carne, pollo o pescado. 1 huevo.  taza (62 g) de tofu. Coma algunos alimentos por da que contengan grasas saludables, como aguacates, frutos secos, semillas y pescado. Qu alimentos debo comer? Nils Pyle Bayas. Manzanas. Naranjas. Duraznos. Damascos. Ciruelas. Uvas. Mangos. Papayas. Granadas. Kiwi. Cerezas. Verduras Verduras de [employer], que incluyen [place], [place], col rizada, acelga, hojas de berza, hojas de mostaza y repollo. Remolachas. Coliflor. Brcoli. Zanahorias. Judas verdes. Tomates. Pimientos. Cebollas. Pepinos. Coles de Bruselas. Granos Granos integrales, como panes, galletas, tortillas, cereales y pastas de salvado o integrales. Avena sin azcar. Quinua. Arroz integral o salvaje. Carnes y otras  protenas Frutos de mar. Carne de ave sin piel. Cortes magros de ave y carne de res. Tofu. Frutos secos. Semillas. Lcteos Productos lcteos sin grasa o con bajo contenido de [place], [place], yogur y [place]. Es posible que los productos detallados arriba no constituyan una lista completa de los alimentos y las bebidas que puede tomar. Consulte a un nutricionista para obtener ms informacin. Qu alimentos debo evitar? Nils Pyle Frutas enlatadas al almbar. Verduras Verduras enlatadas. Verduras congeladas con mantequilla o salsa de crema. Granos Productos elaborados con [place] y [place], como panes, pastas, bocadillos y cereales. Evite todos los alimentos procesados. Carnes y [address] de carne con alto contenido de Holiday representative. Carne de ave con piel. Carnes empanizadas o fritas. Carne procesada. Evite las grasas saturadas. Lcteos Yogur, queso o [employer]. Bebidas Bebidas azucaradas, como gaseosas o t helado. Es posible que los productos que se enumeran ms [place] no constituyan una lista completa de los alimentos y las bebidas que Personnel officer. Consulte a un nutricionista para obtener ms informacin. Preguntas para hacerle al mdico Debo consultar con un especialista certificado en atencin y educacin sobre la diabetes? Es necesario que me rena con un nutricionista? A qu nmero puedo llamar si tengo preguntas? Cules son los mejores momentos para controlar la glucemia? Dnde encontrar ms informacin: American Diabetes Association (Asociacin Estadounidense de la Diabetes): diabetes.org Academy of Nutrition and Dietetics (Academia de Nutricin y Pension scheme manager): eatright.Dana Corporation of Diabetes and Digestive and Kidney Diseases [employer] de la Diabetes y las Enfermedades Digestivas y Renales): StageSync.si Association of Diabetes  Care & Education Specialists (Asociacin de Especialistas en Atencin y Francella Solian la Diabetes):  diabeteseducator.org Resumen Es importante tener hbitos alimenticios saludables debido a que sus niveles de Psychologist, counselling sangre (glucosa) se ven afectados en gran medida por lo que come y bebe. Es importante consumir alcohol con prudencia. Un plan de comidas saludable lo ayudar a controlar la glucosa en sangre y a reducir el riesgo de enfermedades cardacas. El mdico puede recomendarle que trabaje con un nutricionista para elaborar el mejor plan para usted. Esta informacin no tiene Theme park manager el consejo del mdico. Asegrese de hacerle al mdico cualquier pregunta que tenga. Document Revised: 01/24/2020 Document Reviewed: 01/24/2020 Elsevier Patient Education  2024 [employer].

## 2024-01-05 NOTE — Progress Notes (Signed)
 Jackie Wells 53 y.o.   Chief Complaint  Patient presents with   Follow-up    Patient here for 3 month f/u for HTN / DM. Patient states she tested positive for covid 2 weeks ago, she states she does feel better today.     HISTORY OF PRESENT ILLNESS: This is a 53 y.o. female here for 13-month follow-up of hypertension and diabetes Recently had COVID infection.  Much improved today Has no other complaints or medical concerns today.  HPI   Prior to Admission medications   Medication Sig Start Date End Date Taking? Authorizing Provider  atorvastatin  (LIPITOR) 40 MG tablet TAKE 1 TABLET BY MOUTH EVERY DAY 08/02/23   Purcell Emil Schanz, MD  azelastine  (ASTELIN ) 0.1 % nasal spray Place 2 sprays into both nostrils 2 (two) times daily. Use in each nostril as directed Patient not taking: Reported on 01/05/2024 12/08/23   Johnie Flaming A, NP  benzonatate  (TESSALON ) 100 MG capsule Take 1 capsule (100 mg total) by mouth every 8 (eight) hours. Patient not taking: Reported on 01/05/2024 12/08/23   Johnie Flaming A, NP  blood glucose meter kit and supplies KIT Per insurance preference. Check blood glucose once a day. E11.9 03/14/19   Melonie Colonel, Mikel HERO, MD  Dulaglutide  (TRULICITY ) 3 MG/0.5ML SOAJ INJECT 3 MG AS DIRECTED ONCE A WEEK 11/13/23   Purcell Emil Schanz, MD  FARXIGA  10 MG TABS tablet TAKE 1 TABLET BY MOUTH ONCE DAILY BEFORE BREAKFAST 05/15/23   Purcell Emil Schanz, MD  ibuprofen  (ADVIL ) 800 MG tablet Take 1 tablet (800 mg total) by mouth every 8 (eight) hours as needed for mild pain (pain score 1-3). 10/05/23   Purcell Emil Schanz, MD  lisinopril -hydrochlorothiazide  (ZESTORETIC ) 10-12.5 MG tablet Take 1 tablet by mouth daily. 08/10/23   Purcell Emil Schanz, MD  metFORMIN  (GLUCOPHAGE ) 500 MG tablet TAKE 1 TABLET BY MOUTH TWICE DAILY WITH A MEAL 12/12/23   Purcell Emil Schanz, MD    No Known Allergies  Patient Active Problem List   Diagnosis Date Noted   Stage 3a chronic  kidney disease (HCC) 10/05/2023   Adenomatous polyp of colon 05/05/2022   Internal hemorrhoids 05/05/2022   Varicose veins of left lower extremity with complications 08/13/2015   Diabetes type 2, uncontrolled 11/09/2014   Morbid obesity (HCC) 08/04/2014   Dyslipidemia associated with type 2 diabetes mellitus (HCC) 08/04/2014   Hypertension associated with diabetes (HCC) 08/04/2014    Past Medical History:  Diagnosis Date   Anemia    Diabetes mellitus without complication (HCC)    on meds   GERD (gastroesophageal reflux disease)    with certain foods   Hyperlipidemia    Hypertension    PONV (postoperative nausea and vomiting)     Past Surgical History:  Procedure Laterality Date   CESAREAN SECTION  1994   2008   LAPAROSCOPIC UNILATERAL SALPINGO OOPHERECTOMY  1993   TONSILLECTOMY  1984    Social History   Socioeconomic History   Marital status: Single    Spouse name: Not on file   Number of children: Not on file   Years of education: Not on file   Highest education level: Not on file  Occupational History   Not on file  Tobacco Use   Smoking status: Never   Smokeless tobacco: Never  Vaping Use   Vaping status: Never Used  Substance and Sexual Activity   Alcohol use: No    Alcohol/week: 0.0 standard drinks of alcohol   Drug use: No  Sexual activity: Yes    Birth control/protection: Post-menopausal  Other Topics Concern   Not on file  Social History Narrative   Not on file   Social Drivers of Health   Financial Resource Strain: Not on file  Food Insecurity: Not on file  Transportation Needs: Not on file  Physical Activity: Not on file  Stress: Not on file  Social Connections: Not on file  Intimate Partner Violence: Not on file    Family History  Problem Relation Age of Onset   Colon polyps Mother    Hyperlipidemia Mother    Hypertension Mother    Hypertension Father    Colon cancer Neg Hx    Esophageal cancer Neg Hx    Rectal cancer Neg Hx     Stomach cancer Neg Hx      Review of Systems  Constitutional: Negative.  Negative for chills and fever.  HENT: Negative.  Negative for congestion and sore throat.   Respiratory: Negative.  Negative for cough and shortness of breath.   Cardiovascular: Negative.  Negative for chest pain and palpitations.  Gastrointestinal:  Negative for abdominal pain, nausea and vomiting.  Genitourinary: Negative.  Negative for dysuria and hematuria.  Skin: Negative.  Negative for rash.  All other systems reviewed and are negative.   Vitals:   01/05/24 1451  BP: 118/82  Pulse: 72  Temp: 98 F (36.7 C)  SpO2: 94%    Physical Exam Vitals reviewed.  Constitutional:      Appearance: Normal appearance.  HENT:     Head: Normocephalic.     Mouth/Throat:     Mouth: Mucous membranes are moist.     Pharynx: Oropharynx is clear.  Eyes:     Extraocular Movements: Extraocular movements intact.     Conjunctiva/sclera: Conjunctivae normal.     Pupils: Pupils are equal, round, and reactive to light.  Cardiovascular:     Rate and Rhythm: Normal rate and regular rhythm.     Pulses: Normal pulses.     Heart sounds: Normal heart sounds.  Pulmonary:     Effort: Pulmonary effort is normal.     Breath sounds: Normal breath sounds.  Musculoskeletal:     Cervical back: No tenderness.  Lymphadenopathy:     Cervical: No cervical adenopathy.  Skin:    General: Skin is warm and dry.  Neurological:     Mental Status: She is alert and oriented to person, place, and time.  Psychiatric:        Mood and Affect: Mood normal.        Behavior: Behavior normal.    Results for orders placed or performed in visit on 01/05/24 (from the past 24 hours)  POCT HgB A1C     Status: Abnormal   Collection Time: 01/05/24  4:36 PM  Result Value Ref Range   Hemoglobin A1C 8.7 (A) 4.0 - 5.6 %   HbA1c POC (<> result, manual entry)     HbA1c, POC (prediabetic range)     HbA1c, POC (controlled diabetic range)        ASSESSMENT & PLAN: A total of 46 minutes was spent with the patient and counseling/coordination of care regarding preparing for this visit, review of most recent office visit notes, review of multiple chronic medical conditions and their management, review of all medications, review of most recent bloodwork results, review of health maintenance items, education on nutrition, prognosis, documentation, and need for follow up.   Problem List Items Addressed This Visit  Cardiovascular and Mediastinum   Hypertension associated with diabetes (HCC) - Primary   Well-controlled hyper tension Continue Zestoretic  10-12.5 mg daily Hemoglobin A1c at 8.7, better than before but still not at goal Cardiovascular risks associated with uncontrolled diabetes discussed Diet and nutrition discussed Recommend to continue metformin  500 mg twice a day and Farxiga  10 mg daily Continue Trulicity  dose to 3 mg weekly We will follow-up in 3 months Continue with diabetic nutritional services        Endocrine   Dyslipidemia associated with type 2 diabetes mellitus (HCC)   Hemoglobin A1c still not at goal at 8.7 but trending down. Diet and nutrition discussed We will continue metformin  and Farxiga  and weekly Trulicity  Continue atorvastatin  40 mg daily Follow-up in 3 months      Relevant Orders   POCT HgB A1C     Genitourinary   Stage 3a chronic kidney disease (HCC)   Chronic stable condition. Recommend to stay well-hydrated and avoid NSAIDs is much as possible Continue Farxiga  10 mg daily        Other   Morbid obesity (HCC)   Cardiovascular risks associated with obesity discussed Diet and nutrition discussed Benefits of exercise discussed Advised to decrease amount of daily carbohydrate intake and daily calories and increase amount of plant-based protein in her diet We will follow-up in 3 months        Patient Instructions  Diabetes mellitus y nutricin, en adultos Diabetes  Mellitus and Nutrition, Adult Si sufre de diabetes, o diabetes mellitus, es muy importante tener hbitos alimenticios saludables debido a que sus niveles de Psychologist, counselling sangre (glucosa) se ven afectados en gran medida por lo que come y bebe. [place] alimentos saludables en las cantidades correctas, aproximadamente a la misma hora todos los [place] ayudar a: Chief Operating Officer su glucemia. Disminuir el riesgo de sufrir una enfermedad cardaca. Mejorar la presin arterial. Barista o mantener un peso saludable. Qu puede afectar mi plan de alimentacin? Todas las personas que sufren de diabetes son diferentes y cada una tiene necesidades diferentes en cuanto a un plan de alimentacin. El mdico puede recomendarle que trabaje con un nutricionista para elaborar el mejor plan para usted. Su plan de alimentacin puede variar segn factores como: Las caloras que necesita. Los medicamentos que toma. Su peso. Sus niveles de glucemia, presin arterial y colesterol. Su nivel de [place]. Otras afecciones que tenga, como enfermedades cardacas o renales. Cmo me afectan los carbohidratos? Los carbohidratos, o hidratos de carbono, afectan su nivel de glucemia ms que cualquier otro tipo de alimento. La ingesta de carbohidratos aumenta la cantidad de [employer]. Es importante conocer la cantidad de carbohidratos que se pueden ingerir en cada comida sin correr Surveyor, minerals. Esto es Government social research officer. Su nutricionista puede ayudarlo a calcular la cantidad de carbohidratos que debe ingerir en cada comida y en cada refrigerio. Cmo me afecta el alcohol? El alcohol puede provocar una disminucin de la glucemia (hipoglucemia), especialmente si usa  insulina o toma determinados medicamentos por va oral para la diabetes. La hipoglucemia es una afeccin potencialmente mortal. Los sntomas de la hipoglucemia, como somnolencia, mareos y confusin, son similares a los sntomas de haber consumido demasiado  alcohol. No beba alcohol si: Su mdico le indica no hacerlo. Est embarazada, puede estar embarazada o est tratando de quedar embarazada. Si bebe alcohol: Limite la cantidad que bebe a lo siguiente: De 0 a 1 medida por da para las mujeres. De 0 a 2 medidas por da para  los hombres. Sepa cunta cantidad de alcohol hay en las bebidas que toma. En los [address], una medida equivale a una botella de cerveza de 12 oz (355 ml), un vaso de vino de 5 oz (148 ml) o un vaso de una bebida alcohlica de alta graduacin de 1 oz (44 ml). Mantngase hidratado bebiendo agua, refrescos dietticos o t helado sin azcar. Tenga en cuenta que los refrescos comunes, los jugos y otras bebidas para mezclar pueden contener Product/process development scientist y se deben contar como carbohidratos. Consejos para seguir Social worker las etiquetas de los alimentos Comience por leer el tamao de la porcin en la etiqueta de Informacin nutricional de los alimentos envasados y las bebidas. La cantidad de caloras, carbohidratos, grasas y otros nutrientes detallados en la etiqueta se basan en una porcin del alimento. Muchos alimentos contienen ms de una porcin por envase. Verifique la cantidad total de gramos (g) de carbohidratos totales en una porcin. Verifique la cantidad de gramos de grasas saturadas y grasas trans en una porcin. Escoja alimentos que no contengan estas grasas o que su contenido de estas sea [place]. Verifique la cantidad de miligramos (mg) de sal (sodio) en una porcin. La [employer] de las personas deben limitar la ingesta de sodio total a menos de 2300 mg Google. Siempre consulte la informacin nutricional de los alimentos etiquetados como "con bajo contenido de grasa" o "sin grasa". Estos alimentos pueden tener un mayor contenido de International aid/development worker agregada o carbohidratos refinados, y deben evitarse. Hable con su nutricionista para identificar sus objetivos diarios en cuanto a los nutrientes mencionados en la etiqueta. Al ir  de compras Evite comprar alimentos procesados, enlatados o precocidos. Estos alimentos tienden a tener una mayor cantidad de grasa, sodio y azcar agregada. Compre en la zona exterior de la tienda de comestibles. Esta es la zona donde se encuentran con mayor frecuencia las frutas y las verduras frescas, los cereales a granel, las carnes frescas y los productos lcteos frescos. Al cocinar Use mtodos de coccin a baja temperatura, como hornear, en lugar de mtodos de coccin a alta temperatura, como frer en abundante aceite. Cocine con aceites saludables, como el aceite de oliva, canola o girasol. Evite cocinar con manteca, crema o carnes con alto contenido de grasa. Planificacin de las comidas Coma las comidas y los refrigerios regularmente, preferentemente a la misma hora todos [place]. Evite pasar largos perodos de tiempo sin comer. Consuma alimentos ricos en fibra, como frutas frescas, verduras, frijoles y cereales integrales. Consuma entre 4 y 6 onzas (entre 112 y 168 g) de protenas magras por da, como carnes [place], pollo, pescado, huevos o tofu. Una onza (oz) (28 g) de protena magra equivale a: 1 onza (28 g) de carne, pollo o pescado. 1 huevo.  taza (62 g) de tofu. Coma algunos alimentos por da que contengan grasas saludables, como aguacates, frutos secos, semillas y pescado. Qu alimentos debo comer? Joretta Bayas. Manzanas. Naranjas. Duraznos. Damascos. Ciruelas. Uvas. Mangos. Papayas. Granadas. Kiwi. Cerezas. Verduras Verduras de [employer], que incluyen [place], espinaca, col rizada, acelga, hojas de berza, hojas de mostaza y repollo. Remolachas. Coliflor. Brcoli. Zanahorias. Judas verdes. Tomates. Pimientos. Cebollas. Pepinos. Coles de Bruselas. Granos Granos integrales, como panes, galletas, tortillas, cereales y pastas de salvado o integrales. Avena sin azcar. Quinua. Arroz integral o salvaje. Carnes y otras protenas Frutos de mar. Carne de ave sin piel. Cortes magros  de ave y carne de res. Tofu. Frutos secos. Semillas. Lcteos Productos lcteos sin grasa o con  bajo contenido de [place], como leche, yogur y [place]. Es posible que los productos detallados arriba no constituyan una lista completa de los alimentos y las bebidas que puede tomar. Consulte a un nutricionista para obtener ms informacin. Qu alimentos debo evitar? Joretta Frutas enlatadas al almbar. Verduras Verduras enlatadas. Verduras congeladas con mantequilla o salsa de crema. Granos Productos elaborados con [place] y [place], como panes, pastas, bocadillos y cereales. Evite todos los alimentos procesados. Carnes y [address] de carne con alto contenido de Holiday representative. Carne de ave con piel. Carnes empanizadas o fritas. Carne procesada. Evite las grasas saturadas. Lcteos Yogur, queso o [employer]. Bebidas Bebidas azucaradas, como gaseosas o t helado. Es posible que los productos que se enumeran ms [place] no constituyan una lista completa de los alimentos y las bebidas que Personnel officer. Consulte a un nutricionista para obtener ms informacin. Preguntas para hacerle al mdico Debo consultar con un especialista certificado en atencin y educacin sobre la diabetes? Es necesario que me rena con un nutricionista? A qu nmero puedo llamar si tengo preguntas? Cules son los mejores momentos para controlar la glucemia? Dnde encontrar ms informacin: American Diabetes Association (Asociacin Estadounidense de la Diabetes): diabetes.org Academy of Nutrition and Dietetics (Academia de Nutricin y Diettica): eatright.D[employer] Corporation of Diabetes and Digestive and Kidney Diseases [employer] de la Diabetes y las Enfermedades Digestivas y Renales): StageSync.si Association of Diabetes Care & Education Specialists (Asociacin de Especialistas en Atencin y Educacin sobre la Diabetes): diabeteseducator.org Resumen Es importante tener hbitos alimenticios  saludables debido a que sus niveles de Psychologist, counselling sangre (glucosa) se ven afectados en gran medida por lo que come y bebe. Es importante consumir alcohol con prudencia. Un plan de comidas saludable lo ayudar a controlar la glucosa en sangre y a reducir el riesgo de enfermedades cardacas. El mdico puede recomendarle que trabaje con un nutricionista para elaborar el mejor plan para usted. Esta informacin no tiene Theme park manager el consejo del mdico. Asegrese de hacerle al mdico cualquier pregunta que tenga. Document Revised: 01/24/2020 Document Reviewed: 01/24/2020 Elsevier Patient Education  2024 Elsevier Inc.     Emil Schaumann, MD [facility]

## 2024-01-05 NOTE — Assessment & Plan Note (Signed)
 Hemoglobin A1c still not at goal at 8.7 but trending down. Diet and nutrition discussed We will continue metformin  and Farxiga  and weekly Trulicity  Continue atorvastatin  40 mg daily Follow-up in 3 months

## 2024-01-05 NOTE — Assessment & Plan Note (Signed)
 Chronic stable condition. Recommend to stay well-hydrated and avoid NSAIDs is much as possible Continue Farxiga  10 mg daily

## 2024-03-05 ENCOUNTER — Other Ambulatory Visit: Payer: Self-pay | Admitting: Emergency Medicine

## 2024-03-05 DIAGNOSIS — E1159 Type 2 diabetes mellitus with other circulatory complications: Secondary | ICD-10-CM

## 2024-03-13 ENCOUNTER — Ambulatory Visit: Admitting: Skilled Nursing Facility1

## 2024-04-06 ENCOUNTER — Encounter: Payer: Self-pay | Admitting: Emergency Medicine

## 2024-04-06 ENCOUNTER — Ambulatory Visit: Admitting: Emergency Medicine

## 2024-04-06 VITALS — BP 136/84 | HR 94 | Temp 98.3°F | Ht 65.0 in | Wt 223.0 lb

## 2024-04-06 DIAGNOSIS — E1159 Type 2 diabetes mellitus with other circulatory complications: Secondary | ICD-10-CM

## 2024-04-06 DIAGNOSIS — N1831 Chronic kidney disease, stage 3a: Secondary | ICD-10-CM

## 2024-04-06 DIAGNOSIS — Z7985 Long-term (current) use of injectable non-insulin antidiabetic drugs: Secondary | ICD-10-CM | POA: Diagnosis not present

## 2024-04-06 DIAGNOSIS — Z7984 Long term (current) use of oral hypoglycemic drugs: Secondary | ICD-10-CM

## 2024-04-06 DIAGNOSIS — I152 Hypertension secondary to endocrine disorders: Secondary | ICD-10-CM

## 2024-04-06 DIAGNOSIS — E1169 Type 2 diabetes mellitus with other specified complication: Secondary | ICD-10-CM

## 2024-04-06 DIAGNOSIS — E785 Hyperlipidemia, unspecified: Secondary | ICD-10-CM | POA: Diagnosis not present

## 2024-04-06 LAB — POCT GLYCOSYLATED HEMOGLOBIN (HGB A1C): HbA1c POC (<> result, manual entry): 8.7 % (ref 4.0–5.6)

## 2024-04-06 MED ORDER — GLIPIZIDE 5 MG PO TABS: 5.0000 mg | ORAL_TABLET | Freq: Every day | ORAL | 3 refills | Status: AC

## 2024-04-06 NOTE — Assessment & Plan Note (Signed)
 Cardiovascular risks associated with obesity discussed Diet and nutrition discussed Benefits of exercise discussed Advised to decrease amount of daily carbohydrate intake and daily calories and increase amount of plant-based protein in her diet We will follow-up in 3 months

## 2024-04-06 NOTE — Assessment & Plan Note (Signed)
 Chronic stable condition. Recommend to stay well-hydrated and avoid NSAIDs is much as possible Continue Farxiga  10 mg daily

## 2024-04-06 NOTE — Progress Notes (Signed)
 Jackie Wells 53 y.o.   Chief Complaint  Patient presents with   Follow-up    HISTORY OF PRESENT ILLNESS: This is a 53 y.o. female here for 7-month follow-up of chronic medical conditions including hypertension and diabetes. Has no complaints or medical concerns today.  HPI   Prior to Admission medications   Medication Sig Start Date End Date Taking? Authorizing Provider  atorvastatin  (LIPITOR) 40 MG tablet TAKE 1 TABLET BY MOUTH EVERY DAY 08/02/23  Yes Krystyl Cannell, Emil Schanz, MD  blood glucose meter kit and supplies KIT Per insurance preference. Check blood glucose once a day. E11.9 03/14/19  Yes Melonie Colonel, Mikel HERO, MD  Dulaglutide  (TRULICITY ) 3 MG/0.5ML SOAJ INJECT 3 MG AS DIRECTED ONCE A WEEK 11/13/23  Yes Larayne Baxley, Emil Schanz, MD  FARXIGA  10 MG TABS tablet TAKE 1 TABLET BY MOUTH ONCE DAILY BEFORE BREAKFAST 05/15/23  Yes Keagen Heinlen, Emil Schanz, MD  ibuprofen  (ADVIL ) 800 MG tablet Take 1 tablet (800 mg total) by mouth every 8 (eight) hours as needed for mild pain (pain score 1-3). 10/05/23  Yes Sennie Borden, Emil Schanz, MD  lisinopril -hydrochlorothiazide  (ZESTORETIC ) 10-12.5 MG tablet Take 1 tablet by mouth daily. 08/10/23  Yes Isaic Syler, Emil Schanz, MD  metFORMIN  (GLUCOPHAGE ) 500 MG tablet TAKE 1 TABLET BY MOUTH TWICE DAILY WITH A MEAL 03/06/24  Yes Jasyah Theurer, Emil Schanz, MD  azelastine  (ASTELIN ) 0.1 % nasal spray Place 2 sprays into both nostrils 2 (two) times daily. Use in each nostril as directed Patient not taking: Reported on 04/06/2024 12/08/23   Johnie Flaming A, NP  benzonatate  (TESSALON ) 100 MG capsule Take 1 capsule (100 mg total) by mouth every 8 (eight) hours. Patient not taking: Reported on 04/06/2024 12/08/23   Johnie Flaming LABOR, NP    No Known Allergies  Patient Active Problem List   Diagnosis Date Noted   Stage 3a chronic kidney disease (HCC) 10/05/2023   Adenomatous polyp of colon 05/05/2022   Internal hemorrhoids 05/05/2022   Varicose veins of left lower  extremity with complications 08/13/2015   Diabetes type 2, uncontrolled 11/09/2014   Morbid obesity (HCC) 08/04/2014   Dyslipidemia associated with type 2 diabetes mellitus (HCC) 08/04/2014   Hypertension associated with diabetes (HCC) 08/04/2014    Past Medical History:  Diagnosis Date   Anemia    Diabetes mellitus without complication (HCC)    on meds   GERD (gastroesophageal reflux disease)    with certain foods   Hyperlipidemia    Hypertension    PONV (postoperative nausea and vomiting)     Past Surgical History:  Procedure Laterality Date   CESAREAN SECTION  1994   2008   LAPAROSCOPIC UNILATERAL SALPINGO OOPHERECTOMY  1993   TONSILLECTOMY  1984    Social History   Socioeconomic History   Marital status: Single    Spouse name: Not on file   Number of children: Not on file   Years of education: Not on file   Highest education level: Not on file  Occupational History   Not on file  Tobacco Use   Smoking status: Never   Smokeless tobacco: Never  Vaping Use   Vaping status: Never Used  Substance and Sexual Activity   Alcohol use: No    Alcohol/week: 0.0 standard drinks of alcohol   Drug use: No   Sexual activity: Yes    Birth control/protection: Post-menopausal  Other Topics Concern   Not on file  Social History Narrative   Not on file   Social Drivers of Health  Financial Resource Strain: Not on file  Food Insecurity: Not on file  Transportation Needs: Not on file  Physical Activity: Not on file  Stress: Not on file  Social Connections: Not on file  Intimate Partner Violence: Not on file    Family History  Problem Relation Age of Onset   Colon polyps Mother    Hyperlipidemia Mother    Hypertension Mother    Hypertension Father    Colon cancer Neg Hx    Esophageal cancer Neg Hx    Rectal cancer Neg Hx    Stomach cancer Neg Hx      Review of Systems  Constitutional: Negative.  Negative for chills and fever.  HENT: Negative.  Negative for  congestion and sore throat.   Respiratory: Negative.  Negative for cough and shortness of breath.   Cardiovascular: Negative.  Negative for chest pain and palpitations.  Gastrointestinal:  Negative for abdominal pain, diarrhea, nausea and vomiting.  Genitourinary: Negative.  Negative for dysuria and hematuria.  Skin: Negative.  Negative for rash.  Neurological: Negative.  Negative for dizziness and headaches.  All other systems reviewed and are negative.   Vitals:   04/06/24 1502  BP: 136/84  Pulse: 94  Temp: 98.3 F (36.8 C)  SpO2: 96%    Physical Exam Vitals reviewed.  Constitutional:      Appearance: Normal appearance.  HENT:     Head: Normocephalic.     Mouth/Throat:     Mouth: Mucous membranes are moist.     Pharynx: Oropharynx is clear.  Eyes:     Extraocular Movements: Extraocular movements intact.     Pupils: Pupils are equal, round, and reactive to light.  Cardiovascular:     Rate and Rhythm: Normal rate and regular rhythm.     Pulses: Normal pulses.     Heart sounds: Normal heart sounds.  Pulmonary:     Effort: Pulmonary effort is normal.     Breath sounds: Normal breath sounds.  Abdominal:     Palpations: Abdomen is soft.     Tenderness: There is no abdominal tenderness.  Musculoskeletal:     Cervical back: No tenderness.  Lymphadenopathy:     Cervical: No cervical adenopathy.  Skin:    General: Skin is warm and dry.     Capillary Refill: Capillary refill takes less than 2 seconds.  Neurological:     General: No focal deficit present.     Mental Status: She is alert and oriented to person, place, and time.  Psychiatric:        Mood and Affect: Mood normal.        Behavior: Behavior normal.    Results for orders placed or performed in visit on 04/06/24 (from the past 24 hours)  POCT HgB A1C     Status: Abnormal   Collection Time: 04/06/24  3:20 PM  Result Value Ref Range   Hemoglobin A1C     HbA1c POC (<> result, manual entry) 8.7 4.0 - 5.6 %    HbA1c, POC (prediabetic range)     HbA1c, POC (controlled diabetic range)       ASSESSMENT & PLAN: A total of 43 minutes was spent with the patient and counseling/coordination of care regarding preparing for this visit, review of most recent office visit notes, review of multiple chronic medical conditions and their management, cardiovascular risks associated with uncontrolled diabetes, review of all medications and changes made, review of most recent bloodwork results including interpretation of today's hemoglobin A1c, review  of health maintenance items, education on nutrition, prognosis, documentation, and need for follow up.   Problem List Items Addressed This Visit       Cardiovascular and Mediastinum   Hypertension associated with diabetes (HCC) - Primary   BP Readings from Last 3 Encounters:  04/06/24 136/84  01/05/24 118/82  12/08/23 116/74  Well-controlled hypertension Continue Zestoretic  10-12.5 mg daily Hemoglobin A1c today at 8.7, higher than before, not at goal. Cardiovascular risks associated with uncontrolled diabetes discussed Diet and nutrition discussed Continues weekly Trulicity  3 mg, daily metformin  500 mg twice a day, Farxiga  10 mg daily Recommend to start glipizide  5 mg at breakfast Will follow-up in 3 months       Relevant Medications   glipiZIDE  (GLUCOTROL ) 5 MG tablet     Endocrine   Dyslipidemia associated with type 2 diabetes mellitus (HCC)   Hemoglobin A1c unchanged from before at 8.7 Diet and nutrition discussed We will continue metformin  and Farxiga  and weekly Trulicity  Start glipizide  5 mg in morning Continue atorvastatin  40 mg daily Follow-up in 3 months      Relevant Medications   glipiZIDE  (GLUCOTROL ) 5 MG tablet   Other Relevant Orders   POCT HgB A1C (Completed)     Genitourinary   Stage 3a chronic kidney disease (HCC)   Chronic stable condition. Recommend to stay well-hydrated and avoid NSAIDs is much as possible Continue Farxiga   10 mg daily          Other   Morbid obesity (HCC)   Cardiovascular risks associated with obesity discussed Diet and nutrition discussed Benefits of exercise discussed Advised to decrease amount of daily carbohydrate intake and daily calories and increase amount of plant-based protein in her diet We will follow-up in 3 months      Relevant Medications   glipiZIDE  (GLUCOTROL ) 5 MG tablet   Other Visit Diagnoses       Long-term current use of injectable noninsulin antidiabetic medication          Patient Instructions  Diabetes: cmo calcular los carbohidratos que necesita un adulto Diabetes: Carbohydrate Counting for Adults El recuento de carbohidratos es un mtodo para llevar un registro de la cantidad de carbohidratos que ingiere. La ingesta de carbohidratos aumenta la cantidad de azcar, tambin llamada glucosa, en la sangre. Si cuenta cuntos carbohidratos ingiere, controlar mejor su nivel de azcar en la sangre. Esto, a su vez, le ayuda a controlar la diabetes. Los carbohidratos se calculan en gramos (G) por porcin. Es importante saber la cantidad de carbohidratos (en gramos o por tamao de porcin) que puede ingerir en cada comida. Esto es government social research officer. Un nutricionista puede ayudarlo a crear un plan de alimentacin y a calcular la cantidad de carbohidratos que debe ingerir en cada comida y colacin. Qu alimentos contienen carbohidratos? Los carbohidratos se encuentran en estos alimentos: Granos, como panes y cereales. Frijoles secos y alimentos con soja. Verduras con almidn, como papas, guisantes y maz. Joretta y jugos de frutas. Leche y dentist. Dulces y colaciones, como pasteles, galletas, caramelos, papas fritas y refrescos. Cmo se calculan los carbohidratos de los alimentos? Hay dos maneras de calcular los carbohidratos de los alimentos. Puede leer las etiquetas o conocer el tamao de las porciones estndar de los alimentos. Puede usar cualquiera de  estos mtodos o combinarlos. Usar la air cabin crew de informacin nutricional La lista de nutrientes est incluida en las etiquetas de casi todas las bebidas y los alimentos envasados de los [place]. Incluye: El tamao de la  porcin. Informacin sobre los nutrientes de cada porcin. Esto incluye los gramos de carbohidratos por porcin. Para usar la Informacin nutricional, decida cuntas porciones comer. Luego, multiplique el nmero de porciones por la cantidad de carbohidratos que tiene cada porcin. El nmero resultante son los gramos totales de carbohidratos que ingerir. Conocer los tamaos de las porciones estndar de los alimentos Cuando consuma alimentos con carbohidratos y sin envasar, o sin la informacin nutricional en la etiqueta, debe medir las porciones para calcular los gramos de carbohidratos. Dosifique los alimentos que consumir con una balanza de alimentos o una taza medidora si es necesario. Decida cuntas porciones de programmer, systems. Multiplique el nmero de porciones por 15. En los alimentos con carbohidratos, una porcin equivale a 15 G de carbohidratos. Por ejemplo, si consume 2 tazas o 10 OZ (300 G) de fresas, habr ingerido 2 porciones y 95 G de carbohidratos (2 porciones x 15 G = 30 G). Para las comidas que contienen mezclas de ms de un alimento, como las sopas y los guisos, debe calcular los carbohidratos de cada alimento. A continuacin, se presenta una lista de los tamaos estndar de las porciones de alimentos ricos en carbohidratos. Cada una de estas porciones tiene alrededor de 15 G de carbohidratos: 1 rebanada de pan. 1 tortilla de seis pulgadas (15 CM). ? de taza o 2 OZ (53 G) de arroz o pasta cocidos.  taza o 3 OZ (85 G) de lentejas o frijoles cocidos o enlatados, escurridos y enjuagados.  taza o 3 OZ (85 G) de verduras con almidn, como guisantes, maz o zapallo.  taza o 4 OZ (120 G) de cereal cocinado.  taza o 3 OZ (85 G) de papas hervidas o  pur, o  o 3 OZ (85 G) de una papa grande al horno.  taza o 4 OZ fluidas (118 ML) de jugo de frutas. 1 taza u 8 OZ fluidas (237 ML) de [place]. 1 unidad pequea o 4 OZ (106 G) de manzana.  unidad o 2 OZ (63 G) de una banana mediana. 1 taza o 5 OZ (150 G) de fresas. 3 tazas o 1 OZ (28.3 G) de palomitas de maz. Cul sera un ejemplo para calcular los carbohidratos? Para calcular los gramos de carbohidratos del siguiente ejemplo, siga los pasos descritos a continuacin. Ejemplo de comida 3 OZ (85 G) de pechugas de pollo. ? de taza o 4 OZ (106 G) de arroz integral.  taza o 3 OZ (85 G) de maz. 1 taza u 8 OZ fluidas (237 ML) de leche. 1 taza o 5 OZ (150 G) de fresas con crema batida sin azcar. Clculo de carbohidratos Identifique los alimentos con carbohidratos: Arroz. Maz. Leche. Jaclynn. Calcule cuntas porciones come de cada alimento: 2 porciones de arroz. 1 porcin de maz. 1 porcin de leche. 1 porcin de fresas. Multiplique el nmero de porciones por 15 G: 2 porciones de arroz x 15 G = 30 G. 1 porcin de maz x 15 G = 15 G. 1 porcin de leche x 15 G = 15 G. 1 porcin de fresas x 15 G = 15 G. Sume todas las cantidades para calcular los gramos totales de carbohidratos ingeridos: 30 G + 15 G + 15 G + 15 G = 75 G de carbohidratos totales. Dnde obtener ms informacin Para obtener ms informacin, visite los siguientes sitios web: American Diabetes Association (Asociacin Estadounidense de Diabetes): diabetes.org. Haga clic en "Search" (buscar) y escriba "carb counting" (recuento de carbohidratos). Busque el enlace que necesita. Centers  for Disease Control and Prevention (Centros para el Control y la Prevencin de [place]): tonerpromos.no. Haga clic en "Search" (buscar) y escriba "diabetes". Busque el enlace que necesita. Academy of Nutrition and Dietetics (Academia de Nutricin y Pension Scheme Manager): eatright.org Esta informacin no tiene theme park manager el consejo del mdico.  Asegrese de hacerle al mdico cualquier pregunta que tenga. Document Revised: 06/09/2023 Document Reviewed: 06/09/2023 Elsevier Patient Education  2025 Elsevier Inc.     Emil Schaumann, MD [facility]

## 2024-04-06 NOTE — Patient Instructions (Signed)
 Diabetes: cmo calcular los carbohidratos que necesita un adulto Diabetes: Carbohydrate Counting for Adults El recuento de carbohidratos es un mtodo para llevar un registro de la cantidad de carbohidratos que ingiere. La ingesta de carbohidratos aumenta la cantidad de azcar, tambin llamada glucosa, en la sangre. Si cuenta cuntos carbohidratos ingiere, controlar mejor su nivel de azcar en la sangre. Esto, a su vez, le ayuda a controlar la diabetes. Los carbohidratos se calculan en gramos (G) por porcin. Es importante saber la cantidad de carbohidratos (en gramos o por tamao de porcin) que puede ingerir en cada comida. Esto es Government social research officer. Un nutricionista puede ayudarlo a crear un plan de alimentacin y a calcular la cantidad de carbohidratos que debe ingerir en cada comida y colacin. Qu alimentos contienen carbohidratos? Los carbohidratos se encuentran en estos alimentos: Granos, como panes y cereales. Frijoles secos y alimentos con soja. Verduras con almidn, como papas, guisantes y maz. Joretta y jugos de frutas. Leche y Dentist. Dulces y colaciones, como pasteles, galletas, caramelos, papas fritas y refrescos. Cmo se calculan los carbohidratos de los alimentos? Hay dos maneras de calcular los carbohidratos de los alimentos. Puede leer las etiquetas o conocer el tamao de las porciones estndar de los alimentos. Puede usar cualquiera de estos mtodos o combinarlos. Usar la Air cabin crew de informacin nutricional La lista de nutrientes est incluida en las etiquetas de casi todas las bebidas y los alimentos envasados de los [place]. Incluye: El tamao de la porcin. Informacin sobre los nutrientes de cada porcin. Esto incluye los gramos de carbohidratos por porcin. Para usar la Informacin nutricional, decida cuntas porciones comer. Luego, multiplique el nmero de porciones por la cantidad de carbohidratos que tiene cada porcin. El nmero resultante son los  gramos totales de carbohidratos que ingerir. Conocer los tamaos de las porciones estndar de los alimentos Cuando consuma alimentos con carbohidratos y sin envasar, o sin la informacin nutricional en la etiqueta, debe medir las porciones para calcular los gramos de carbohidratos. Dosifique los alimentos que consumir con una balanza de alimentos o una taza medidora si es necesario. Decida cuntas porciones de Programmer, systems. Multiplique el nmero de porciones por 15. En los alimentos con carbohidratos, una porcin equivale a 15 G de carbohidratos. Por ejemplo, si consume 2 tazas o 10 OZ (300 G) de fresas, habr ingerido 2 porciones y 26 G de carbohidratos (2 porciones x 15 G = 30 G). Para las comidas que contienen mezclas de ms de un alimento, como las sopas y los guisos, debe calcular los carbohidratos de cada alimento. A continuacin, se presenta una lista de los tamaos estndar de las porciones de alimentos ricos en carbohidratos. Cada una de estas porciones tiene alrededor de 15 G de carbohidratos: 1 rebanada de pan. 1 tortilla de seis pulgadas (15 CM). ? de taza o 2 OZ (53 G) de arroz o pasta cocidos.  taza o 3 OZ (85 G) de lentejas o frijoles cocidos o enlatados, escurridos y enjuagados.  taza o 3 OZ (85 G) de verduras con almidn, como guisantes, maz o zapallo.  taza o 4 OZ (120 G) de cereal cocinado.  taza o 3 OZ (85 G) de papas hervidas o pur, o  o 3 OZ (85 G) de una papa grande al horno.  taza o 4 OZ fluidas (118 ML) de jugo de frutas. 1 taza u 8 OZ fluidas (237 ML) de [place]. 1 unidad pequea o 4 OZ (106 G) de manzana.  unidad o 2 OZ (63  G) de una banana mediana. 1 taza o 5 OZ (150 G) de fresas. 3 tazas o 1 OZ (28.3 G) de palomitas de maz. Cul sera un ejemplo para calcular los carbohidratos? Para calcular los gramos de carbohidratos del siguiente ejemplo, siga los pasos descritos a continuacin. Ejemplo de comida 3 OZ (85 G) de pechugas de pollo. ? de  taza o 4 OZ (106 G) de arroz integral.  taza o 3 OZ (85 G) de maz. 1 taza u 8 OZ fluidas (237 ML) de [place]. 1 taza o 5 OZ (150 G) de fresas con crema batida sin azcar. Clculo de carbohidratos Identifique los alimentos con carbohidratos: Arroz. Maz. Leche. Jaclynn. Calcule cuntas porciones come de cada alimento: 2 porciones de arroz. 1 porcin de maz. 1 porcin de leche. 1 porcin de fresas. Multiplique el nmero de porciones por 15 G: 2 porciones de arroz x 15 G = 30 G. 1 porcin de maz x 15 G = 15 G. 1 porcin de leche x 15 G = 15 G. 1 porcin de fresas x 15 G = 15 G. Sume todas las cantidades para calcular los gramos totales de carbohidratos ingeridos: 30 G + 15 G + 15 G + 15 G = 75 G de carbohidratos totales. Dnde obtener ms informacin Para obtener ms informacin, visite los siguientes sitios web: American Diabetes Association (Asociacin Estadounidense de Diabetes): diabetes.org. Haga clic en "Search" (buscar) y escriba "carb counting" (recuento de carbohidratos). Busque el enlace que necesita. Centers for Disease Control and Prevention (Centros para el Control y la Prevencin de [place]): TonerPromos.no. Haga clic en "Search" (buscar) y escriba "diabetes". Busque el enlace que necesita. Academy of Nutrition and Dietetics (Academia de Nutricin y Pension scheme manager): eatright.org Esta informacin no tiene Theme park manager el consejo del mdico. Asegrese de hacerle al mdico cualquier pregunta que tenga. Document Revised: 06/09/2023 Document Reviewed: 06/09/2023 Elsevier Patient Education  2025 [employer].

## 2024-04-06 NOTE — Assessment & Plan Note (Signed)
 Hemoglobin A1c unchanged from before at 8.7 Diet and nutrition discussed We will continue metformin  and Farxiga  and weekly Trulicity  Start glipizide  5 mg in morning Continue atorvastatin  40 mg daily Follow-up in 3 months

## 2024-04-06 NOTE — Assessment & Plan Note (Signed)
 BP Readings from Last 3 Encounters:  04/06/24 136/84  01/05/24 118/82  12/08/23 116/74  Well-controlled hypertension Continue Zestoretic  10-12.5 mg daily Hemoglobin A1c today at 8.7, higher than before, not at goal. Cardiovascular risks associated with uncontrolled diabetes discussed Diet and nutrition discussed Continues weekly Trulicity  3 mg, daily metformin  500 mg twice a day, Farxiga  10 mg daily Recommend to start glipizide  5 mg at breakfast Will follow-up in 3 months

## 2024-04-17 ENCOUNTER — Ambulatory Visit: Admitting: Skilled Nursing Facility1

## 2024-05-11 ENCOUNTER — Other Ambulatory Visit: Payer: Self-pay | Admitting: Emergency Medicine

## 2024-05-11 DIAGNOSIS — Z1231 Encounter for screening mammogram for malignant neoplasm of breast: Secondary | ICD-10-CM

## 2024-05-23 ENCOUNTER — Ambulatory Visit
Admission: RE | Admit: 2024-05-23 | Discharge: 2024-05-23 | Disposition: A | Discharge status: Home / Self Care | Source: Ambulatory Visit | Attending: Emergency Medicine | Admitting: Emergency Medicine

## 2024-05-23 DIAGNOSIS — Z1231 Encounter for screening mammogram for malignant neoplasm of breast: Secondary | ICD-10-CM

## 2024-05-26 ENCOUNTER — Other Ambulatory Visit: Payer: Self-pay | Admitting: Emergency Medicine

## 2024-05-30 ENCOUNTER — Other Ambulatory Visit: Payer: Self-pay | Admitting: Emergency Medicine

## 2024-05-30 DIAGNOSIS — I152 Hypertension secondary to endocrine disorders: Secondary | ICD-10-CM

## 2024-05-31 ENCOUNTER — Ambulatory Visit: Payer: Self-pay | Admitting: Emergency Medicine

## 2024-06-27 ENCOUNTER — Other Ambulatory Visit: Payer: Self-pay | Admitting: Family

## 2024-07-10 ENCOUNTER — Ambulatory Visit: Admitting: Emergency Medicine
